# Patient Record
Sex: Female | Born: 1945 | Race: Black or African American | Hispanic: No | Marital: Married | Smoking: Never smoker
Health system: Southern US, Community
[De-identification: ages and names within clinical notes are randomized; demographics above are authoritative.]

## PROBLEM LIST (undated history)

## (undated) DIAGNOSIS — M1990 Unspecified osteoarthritis, unspecified site: Secondary | ICD-10-CM

## (undated) DIAGNOSIS — I10 Essential (primary) hypertension: Secondary | ICD-10-CM

## (undated) DIAGNOSIS — F329 Major depressive disorder, single episode, unspecified: Secondary | ICD-10-CM

## (undated) DIAGNOSIS — N183 Chronic kidney disease, stage 3 unspecified: Secondary | ICD-10-CM

## (undated) DIAGNOSIS — K219 Gastro-esophageal reflux disease without esophagitis: Secondary | ICD-10-CM

## (undated) DIAGNOSIS — I429 Cardiomyopathy, unspecified: Secondary | ICD-10-CM

## (undated) DIAGNOSIS — E785 Hyperlipidemia, unspecified: Secondary | ICD-10-CM

## (undated) DIAGNOSIS — E119 Type 2 diabetes mellitus without complications: Secondary | ICD-10-CM

## (undated) DIAGNOSIS — M109 Gout, unspecified: Secondary | ICD-10-CM

## (undated) DIAGNOSIS — F419 Anxiety disorder, unspecified: Secondary | ICD-10-CM

## (undated) DIAGNOSIS — F32A Depression, unspecified: Secondary | ICD-10-CM

## (undated) DIAGNOSIS — D649 Anemia, unspecified: Secondary | ICD-10-CM

## (undated) HISTORY — DX: Type 2 diabetes mellitus without complications: E11.9

## (undated) HISTORY — DX: Essential (primary) hypertension: I10

## (undated) HISTORY — PX: OTHER SURGICAL HISTORY: SHX169

## (undated) HISTORY — DX: Hyperlipidemia, unspecified: E78.5

## (undated) HISTORY — DX: Anemia, unspecified: D64.9

## (undated) HISTORY — PX: PARTIAL HYSTERECTOMY: SHX80

## (undated) HISTORY — DX: Gout, unspecified: M10.9

## (undated) HISTORY — DX: Gastro-esophageal reflux disease without esophagitis: K21.9

## (undated) HISTORY — PX: BREAST SURGERY: SHX581

## (undated) HISTORY — DX: Chronic kidney disease, stage 3 unspecified: N18.30

## (undated) HISTORY — PX: EYE SURGERY: SHX253

## (undated) HISTORY — DX: Chronic kidney disease, stage 3 (moderate): N18.3

## (undated) HISTORY — PX: CHOLECYSTECTOMY: SHX55

## (undated) HISTORY — DX: Cardiomyopathy, unspecified: I42.9

---
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
## 2000-11-29 ENCOUNTER — Ambulatory Visit (HOSPITAL_COMMUNITY): Admission: RE | Admit: 2000-11-29 | Discharge: 2000-11-29 | Payer: Self-pay | Admitting: Cardiology

## 2004-11-17 ENCOUNTER — Ambulatory Visit: Payer: Self-pay | Admitting: Cardiology

## 2004-12-08 ENCOUNTER — Ambulatory Visit: Payer: Self-pay | Admitting: Cardiology

## 2004-12-09 ENCOUNTER — Ambulatory Visit: Payer: Self-pay | Admitting: Cardiology

## 2004-12-16 ENCOUNTER — Ambulatory Visit: Payer: Self-pay | Admitting: Cardiology

## 2005-04-09 ENCOUNTER — Ambulatory Visit: Payer: Self-pay | Admitting: Cardiology

## 2005-05-15 ENCOUNTER — Inpatient Hospital Stay (HOSPITAL_COMMUNITY): Admission: AD | Admit: 2005-05-15 | Discharge: 2005-05-20 | Payer: Self-pay | Admitting: Neurosurgery

## 2009-02-22 ENCOUNTER — Ambulatory Visit: Payer: Self-pay | Admitting: Cardiology

## 2011-06-15 DIAGNOSIS — H43819 Vitreous degeneration, unspecified eye: Secondary | ICD-10-CM | POA: Insufficient documentation

## 2011-06-15 DIAGNOSIS — H269 Unspecified cataract: Secondary | ICD-10-CM | POA: Insufficient documentation

## 2012-06-01 DIAGNOSIS — E119 Type 2 diabetes mellitus without complications: Secondary | ICD-10-CM | POA: Insufficient documentation

## 2014-11-19 DIAGNOSIS — E780 Pure hypercholesterolemia: Secondary | ICD-10-CM | POA: Diagnosis not present

## 2014-11-19 DIAGNOSIS — E782 Mixed hyperlipidemia: Secondary | ICD-10-CM | POA: Diagnosis not present

## 2014-11-19 DIAGNOSIS — E781 Pure hyperglyceridemia: Secondary | ICD-10-CM | POA: Diagnosis not present

## 2014-11-19 DIAGNOSIS — E875 Hyperkalemia: Secondary | ICD-10-CM | POA: Diagnosis not present

## 2014-11-19 DIAGNOSIS — E1165 Type 2 diabetes mellitus with hyperglycemia: Secondary | ICD-10-CM | POA: Diagnosis not present

## 2014-11-27 DIAGNOSIS — I10 Essential (primary) hypertension: Secondary | ICD-10-CM | POA: Diagnosis not present

## 2014-11-27 DIAGNOSIS — Z1389 Encounter for screening for other disorder: Secondary | ICD-10-CM | POA: Diagnosis not present

## 2014-11-27 DIAGNOSIS — M109 Gout, unspecified: Secondary | ICD-10-CM | POA: Diagnosis not present

## 2014-11-27 DIAGNOSIS — D509 Iron deficiency anemia, unspecified: Secondary | ICD-10-CM | POA: Diagnosis not present

## 2014-11-27 DIAGNOSIS — I5022 Chronic systolic (congestive) heart failure: Secondary | ICD-10-CM | POA: Diagnosis not present

## 2014-11-27 DIAGNOSIS — F411 Generalized anxiety disorder: Secondary | ICD-10-CM | POA: Diagnosis not present

## 2014-11-27 DIAGNOSIS — M170 Bilateral primary osteoarthritis of knee: Secondary | ICD-10-CM | POA: Diagnosis not present

## 2014-12-02 DIAGNOSIS — M109 Gout, unspecified: Secondary | ICD-10-CM | POA: Diagnosis not present

## 2014-12-03 DIAGNOSIS — M25561 Pain in right knee: Secondary | ICD-10-CM | POA: Diagnosis not present

## 2014-12-03 DIAGNOSIS — M79671 Pain in right foot: Secondary | ICD-10-CM | POA: Diagnosis not present

## 2014-12-03 DIAGNOSIS — D649 Anemia, unspecified: Secondary | ICD-10-CM | POA: Diagnosis not present

## 2014-12-03 DIAGNOSIS — Z794 Long term (current) use of insulin: Secondary | ICD-10-CM | POA: Diagnosis not present

## 2014-12-03 DIAGNOSIS — N390 Urinary tract infection, site not specified: Secondary | ICD-10-CM | POA: Diagnosis not present

## 2014-12-03 DIAGNOSIS — I509 Heart failure, unspecified: Secondary | ICD-10-CM | POA: Diagnosis not present

## 2014-12-03 DIAGNOSIS — E780 Pure hypercholesterolemia: Secondary | ICD-10-CM | POA: Diagnosis not present

## 2014-12-03 DIAGNOSIS — R300 Dysuria: Secondary | ICD-10-CM | POA: Diagnosis not present

## 2014-12-03 DIAGNOSIS — E119 Type 2 diabetes mellitus without complications: Secondary | ICD-10-CM | POA: Diagnosis not present

## 2014-12-03 DIAGNOSIS — Z79899 Other long term (current) drug therapy: Secondary | ICD-10-CM | POA: Diagnosis not present

## 2014-12-03 DIAGNOSIS — M109 Gout, unspecified: Secondary | ICD-10-CM | POA: Diagnosis not present

## 2014-12-03 DIAGNOSIS — I10 Essential (primary) hypertension: Secondary | ICD-10-CM | POA: Diagnosis not present

## 2014-12-03 DIAGNOSIS — K219 Gastro-esophageal reflux disease without esophagitis: Secondary | ICD-10-CM | POA: Diagnosis not present

## 2015-01-30 DIAGNOSIS — E119 Type 2 diabetes mellitus without complications: Secondary | ICD-10-CM | POA: Diagnosis not present

## 2015-01-30 DIAGNOSIS — N183 Chronic kidney disease, stage 3 (moderate): Secondary | ICD-10-CM | POA: Diagnosis not present

## 2015-01-30 DIAGNOSIS — M109 Gout, unspecified: Secondary | ICD-10-CM | POA: Diagnosis not present

## 2015-01-30 DIAGNOSIS — M2550 Pain in unspecified joint: Secondary | ICD-10-CM | POA: Diagnosis not present

## 2015-03-06 DIAGNOSIS — E119 Type 2 diabetes mellitus without complications: Secondary | ICD-10-CM | POA: Diagnosis not present

## 2015-03-06 DIAGNOSIS — M2550 Pain in unspecified joint: Secondary | ICD-10-CM | POA: Diagnosis not present

## 2015-03-06 DIAGNOSIS — M109 Gout, unspecified: Secondary | ICD-10-CM | POA: Diagnosis not present

## 2015-03-06 DIAGNOSIS — N183 Chronic kidney disease, stage 3 (moderate): Secondary | ICD-10-CM | POA: Diagnosis not present

## 2015-03-11 DIAGNOSIS — H269 Unspecified cataract: Secondary | ICD-10-CM | POA: Diagnosis not present

## 2015-03-11 DIAGNOSIS — H25813 Combined forms of age-related cataract, bilateral: Secondary | ICD-10-CM | POA: Diagnosis not present

## 2015-03-11 DIAGNOSIS — E119 Type 2 diabetes mellitus without complications: Secondary | ICD-10-CM | POA: Diagnosis not present

## 2015-03-11 DIAGNOSIS — Z794 Long term (current) use of insulin: Secondary | ICD-10-CM | POA: Diagnosis not present

## 2015-03-11 DIAGNOSIS — H43811 Vitreous degeneration, right eye: Secondary | ICD-10-CM | POA: Diagnosis not present

## 2015-03-11 DIAGNOSIS — H40003 Preglaucoma, unspecified, bilateral: Secondary | ICD-10-CM | POA: Diagnosis not present

## 2015-03-21 DIAGNOSIS — E781 Pure hyperglyceridemia: Secondary | ICD-10-CM | POA: Diagnosis not present

## 2015-03-21 DIAGNOSIS — E1122 Type 2 diabetes mellitus with diabetic chronic kidney disease: Secondary | ICD-10-CM | POA: Diagnosis not present

## 2015-03-21 DIAGNOSIS — E780 Pure hypercholesterolemia: Secondary | ICD-10-CM | POA: Diagnosis not present

## 2015-03-21 DIAGNOSIS — E875 Hyperkalemia: Secondary | ICD-10-CM | POA: Diagnosis not present

## 2015-03-21 DIAGNOSIS — I10 Essential (primary) hypertension: Secondary | ICD-10-CM | POA: Diagnosis not present

## 2015-03-28 DIAGNOSIS — F411 Generalized anxiety disorder: Secondary | ICD-10-CM | POA: Diagnosis not present

## 2015-03-28 DIAGNOSIS — I5022 Chronic systolic (congestive) heart failure: Secondary | ICD-10-CM | POA: Diagnosis not present

## 2015-03-28 DIAGNOSIS — M170 Bilateral primary osteoarthritis of knee: Secondary | ICD-10-CM | POA: Diagnosis not present

## 2015-03-28 DIAGNOSIS — D509 Iron deficiency anemia, unspecified: Secondary | ICD-10-CM | POA: Diagnosis not present

## 2015-03-28 DIAGNOSIS — I10 Essential (primary) hypertension: Secondary | ICD-10-CM | POA: Diagnosis not present

## 2015-04-18 DIAGNOSIS — M109 Gout, unspecified: Secondary | ICD-10-CM | POA: Diagnosis not present

## 2015-08-06 DIAGNOSIS — E7800 Pure hypercholesterolemia, unspecified: Secondary | ICD-10-CM | POA: Diagnosis not present

## 2015-08-06 DIAGNOSIS — I10 Essential (primary) hypertension: Secondary | ICD-10-CM | POA: Diagnosis not present

## 2015-08-06 DIAGNOSIS — E1165 Type 2 diabetes mellitus with hyperglycemia: Secondary | ICD-10-CM | POA: Diagnosis not present

## 2015-08-14 DIAGNOSIS — M170 Bilateral primary osteoarthritis of knee: Secondary | ICD-10-CM | POA: Diagnosis not present

## 2015-08-14 DIAGNOSIS — F411 Generalized anxiety disorder: Secondary | ICD-10-CM | POA: Diagnosis not present

## 2015-08-14 DIAGNOSIS — I5022 Chronic systolic (congestive) heart failure: Secondary | ICD-10-CM | POA: Diagnosis not present

## 2015-08-14 DIAGNOSIS — I10 Essential (primary) hypertension: Secondary | ICD-10-CM | POA: Diagnosis not present

## 2015-08-14 DIAGNOSIS — Z23 Encounter for immunization: Secondary | ICD-10-CM | POA: Diagnosis not present

## 2015-08-14 DIAGNOSIS — D509 Iron deficiency anemia, unspecified: Secondary | ICD-10-CM | POA: Diagnosis not present

## 2015-10-14 DIAGNOSIS — E119 Type 2 diabetes mellitus without complications: Secondary | ICD-10-CM | POA: Diagnosis not present

## 2015-10-14 DIAGNOSIS — M659 Synovitis and tenosynovitis, unspecified: Secondary | ICD-10-CM | POA: Diagnosis not present

## 2015-10-14 DIAGNOSIS — M2550 Pain in unspecified joint: Secondary | ICD-10-CM | POA: Diagnosis not present

## 2015-10-14 DIAGNOSIS — M109 Gout, unspecified: Secondary | ICD-10-CM | POA: Diagnosis not present

## 2015-10-14 DIAGNOSIS — N183 Chronic kidney disease, stage 3 (moderate): Secondary | ICD-10-CM | POA: Diagnosis not present

## 2015-10-21 DIAGNOSIS — E1136 Type 2 diabetes mellitus with diabetic cataract: Secondary | ICD-10-CM | POA: Diagnosis not present

## 2015-10-21 DIAGNOSIS — I10 Essential (primary) hypertension: Secondary | ICD-10-CM | POA: Diagnosis not present

## 2015-10-21 DIAGNOSIS — Z7982 Long term (current) use of aspirin: Secondary | ICD-10-CM | POA: Diagnosis not present

## 2015-10-21 DIAGNOSIS — Z880 Allergy status to penicillin: Secondary | ICD-10-CM | POA: Diagnosis not present

## 2015-10-21 DIAGNOSIS — H43811 Vitreous degeneration, right eye: Secondary | ICD-10-CM | POA: Diagnosis not present

## 2015-10-21 DIAGNOSIS — E119 Type 2 diabetes mellitus without complications: Secondary | ICD-10-CM | POA: Diagnosis not present

## 2015-10-21 DIAGNOSIS — H40003 Preglaucoma, unspecified, bilateral: Secondary | ICD-10-CM | POA: Diagnosis not present

## 2015-10-21 DIAGNOSIS — H25813 Combined forms of age-related cataract, bilateral: Secondary | ICD-10-CM | POA: Diagnosis not present

## 2015-10-21 DIAGNOSIS — Z79899 Other long term (current) drug therapy: Secondary | ICD-10-CM | POA: Diagnosis not present

## 2015-10-21 DIAGNOSIS — E1159 Type 2 diabetes mellitus with other circulatory complications: Secondary | ICD-10-CM | POA: Diagnosis not present

## 2015-10-21 DIAGNOSIS — Z794 Long term (current) use of insulin: Secondary | ICD-10-CM | POA: Diagnosis not present

## 2015-11-06 DIAGNOSIS — F419 Anxiety disorder, unspecified: Secondary | ICD-10-CM | POA: Insufficient documentation

## 2015-11-06 DIAGNOSIS — M109 Gout, unspecified: Secondary | ICD-10-CM | POA: Insufficient documentation

## 2015-11-06 DIAGNOSIS — K219 Gastro-esophageal reflux disease without esophagitis: Secondary | ICD-10-CM | POA: Insufficient documentation

## 2015-11-10 DIAGNOSIS — H25813 Combined forms of age-related cataract, bilateral: Secondary | ICD-10-CM | POA: Diagnosis not present

## 2015-11-10 DIAGNOSIS — Z880 Allergy status to penicillin: Secondary | ICD-10-CM | POA: Diagnosis not present

## 2015-11-10 DIAGNOSIS — F419 Anxiety disorder, unspecified: Secondary | ICD-10-CM | POA: Diagnosis not present

## 2015-11-10 DIAGNOSIS — E785 Hyperlipidemia, unspecified: Secondary | ICD-10-CM | POA: Diagnosis not present

## 2015-11-10 DIAGNOSIS — I10 Essential (primary) hypertension: Secondary | ICD-10-CM | POA: Diagnosis not present

## 2015-11-10 DIAGNOSIS — Z7982 Long term (current) use of aspirin: Secondary | ICD-10-CM | POA: Diagnosis not present

## 2015-11-10 DIAGNOSIS — Z794 Long term (current) use of insulin: Secondary | ICD-10-CM | POA: Diagnosis not present

## 2015-11-10 DIAGNOSIS — H25812 Combined forms of age-related cataract, left eye: Secondary | ICD-10-CM | POA: Diagnosis not present

## 2015-11-10 DIAGNOSIS — E119 Type 2 diabetes mellitus without complications: Secondary | ICD-10-CM | POA: Diagnosis not present

## 2015-11-10 DIAGNOSIS — Z7984 Long term (current) use of oral hypoglycemic drugs: Secondary | ICD-10-CM | POA: Diagnosis not present

## 2015-11-10 DIAGNOSIS — M109 Gout, unspecified: Secondary | ICD-10-CM | POA: Diagnosis not present

## 2015-11-10 DIAGNOSIS — K219 Gastro-esophageal reflux disease without esophagitis: Secondary | ICD-10-CM | POA: Diagnosis not present

## 2015-11-12 DIAGNOSIS — N183 Chronic kidney disease, stage 3 (moderate): Secondary | ICD-10-CM | POA: Diagnosis not present

## 2015-11-12 DIAGNOSIS — E119 Type 2 diabetes mellitus without complications: Secondary | ICD-10-CM | POA: Diagnosis not present

## 2015-11-12 DIAGNOSIS — M109 Gout, unspecified: Secondary | ICD-10-CM | POA: Diagnosis not present

## 2015-11-12 DIAGNOSIS — M2550 Pain in unspecified joint: Secondary | ICD-10-CM | POA: Diagnosis not present

## 2015-11-19 DIAGNOSIS — N3000 Acute cystitis without hematuria: Secondary | ICD-10-CM | POA: Diagnosis not present

## 2015-11-27 DIAGNOSIS — Z961 Presence of intraocular lens: Secondary | ICD-10-CM | POA: Diagnosis not present

## 2015-11-27 DIAGNOSIS — Z9842 Cataract extraction status, left eye: Secondary | ICD-10-CM | POA: Diagnosis not present

## 2015-11-27 DIAGNOSIS — Z48810 Encounter for surgical aftercare following surgery on the sense organs: Secondary | ICD-10-CM | POA: Diagnosis not present

## 2015-12-01 DIAGNOSIS — Z794 Long term (current) use of insulin: Secondary | ICD-10-CM | POA: Diagnosis not present

## 2015-12-01 DIAGNOSIS — Z7984 Long term (current) use of oral hypoglycemic drugs: Secondary | ICD-10-CM | POA: Diagnosis not present

## 2015-12-01 DIAGNOSIS — H25811 Combined forms of age-related cataract, right eye: Secondary | ICD-10-CM | POA: Diagnosis not present

## 2015-12-01 DIAGNOSIS — Z7982 Long term (current) use of aspirin: Secondary | ICD-10-CM | POA: Diagnosis not present

## 2015-12-01 DIAGNOSIS — Z880 Allergy status to penicillin: Secondary | ICD-10-CM | POA: Diagnosis not present

## 2015-12-01 DIAGNOSIS — I10 Essential (primary) hypertension: Secondary | ICD-10-CM | POA: Diagnosis not present

## 2015-12-01 DIAGNOSIS — E119 Type 2 diabetes mellitus without complications: Secondary | ICD-10-CM | POA: Diagnosis not present

## 2015-12-01 DIAGNOSIS — F419 Anxiety disorder, unspecified: Secondary | ICD-10-CM | POA: Diagnosis not present

## 2015-12-01 DIAGNOSIS — K219 Gastro-esophageal reflux disease without esophagitis: Secondary | ICD-10-CM | POA: Diagnosis not present

## 2015-12-15 DIAGNOSIS — E782 Mixed hyperlipidemia: Secondary | ICD-10-CM | POA: Diagnosis not present

## 2015-12-15 DIAGNOSIS — E1165 Type 2 diabetes mellitus with hyperglycemia: Secondary | ICD-10-CM | POA: Diagnosis not present

## 2015-12-15 DIAGNOSIS — I10 Essential (primary) hypertension: Secondary | ICD-10-CM | POA: Diagnosis not present

## 2015-12-15 DIAGNOSIS — D649 Anemia, unspecified: Secondary | ICD-10-CM | POA: Diagnosis not present

## 2015-12-15 DIAGNOSIS — E875 Hyperkalemia: Secondary | ICD-10-CM | POA: Diagnosis not present

## 2015-12-25 DIAGNOSIS — I5022 Chronic systolic (congestive) heart failure: Secondary | ICD-10-CM | POA: Diagnosis not present

## 2015-12-25 DIAGNOSIS — D509 Iron deficiency anemia, unspecified: Secondary | ICD-10-CM | POA: Diagnosis not present

## 2015-12-25 DIAGNOSIS — N183 Chronic kidney disease, stage 3 (moderate): Secondary | ICD-10-CM | POA: Diagnosis not present

## 2015-12-25 DIAGNOSIS — I10 Essential (primary) hypertension: Secondary | ICD-10-CM | POA: Diagnosis not present

## 2015-12-26 DIAGNOSIS — Z48810 Encounter for surgical aftercare following surgery on the sense organs: Secondary | ICD-10-CM | POA: Diagnosis not present

## 2015-12-26 DIAGNOSIS — Z9841 Cataract extraction status, right eye: Secondary | ICD-10-CM | POA: Diagnosis not present

## 2015-12-26 DIAGNOSIS — Z961 Presence of intraocular lens: Secondary | ICD-10-CM | POA: Diagnosis not present

## 2015-12-26 DIAGNOSIS — Z9842 Cataract extraction status, left eye: Secondary | ICD-10-CM | POA: Diagnosis not present

## 2016-01-01 DIAGNOSIS — N390 Urinary tract infection, site not specified: Secondary | ICD-10-CM | POA: Diagnosis not present

## 2016-01-01 DIAGNOSIS — R197 Diarrhea, unspecified: Secondary | ICD-10-CM | POA: Diagnosis not present

## 2016-01-01 DIAGNOSIS — R05 Cough: Secondary | ICD-10-CM | POA: Diagnosis not present

## 2016-01-01 DIAGNOSIS — E860 Dehydration: Secondary | ICD-10-CM | POA: Diagnosis not present

## 2016-01-02 DIAGNOSIS — N390 Urinary tract infection, site not specified: Secondary | ICD-10-CM | POA: Diagnosis not present

## 2016-01-02 DIAGNOSIS — J189 Pneumonia, unspecified organism: Secondary | ICD-10-CM | POA: Diagnosis not present

## 2016-01-02 DIAGNOSIS — R197 Diarrhea, unspecified: Secondary | ICD-10-CM | POA: Diagnosis not present

## 2016-01-02 DIAGNOSIS — E860 Dehydration: Secondary | ICD-10-CM | POA: Diagnosis not present

## 2016-01-02 DIAGNOSIS — I5033 Acute on chronic diastolic (congestive) heart failure: Secondary | ICD-10-CM | POA: Diagnosis not present

## 2016-01-02 DIAGNOSIS — E7800 Pure hypercholesterolemia, unspecified: Secondary | ICD-10-CM | POA: Diagnosis not present

## 2016-01-02 DIAGNOSIS — N183 Chronic kidney disease, stage 3 (moderate): Secondary | ICD-10-CM | POA: Diagnosis not present

## 2016-01-02 DIAGNOSIS — I130 Hypertensive heart and chronic kidney disease with heart failure and stage 1 through stage 4 chronic kidney disease, or unspecified chronic kidney disease: Secondary | ICD-10-CM | POA: Diagnosis not present

## 2016-01-02 DIAGNOSIS — R05 Cough: Secondary | ICD-10-CM | POA: Diagnosis not present

## 2016-01-02 DIAGNOSIS — Z794 Long term (current) use of insulin: Secondary | ICD-10-CM | POA: Diagnosis not present

## 2016-01-02 DIAGNOSIS — B9620 Unspecified Escherichia coli [E. coli] as the cause of diseases classified elsewhere: Secondary | ICD-10-CM | POA: Diagnosis not present

## 2016-01-02 DIAGNOSIS — Z880 Allergy status to penicillin: Secondary | ICD-10-CM | POA: Diagnosis not present

## 2016-01-02 DIAGNOSIS — E1122 Type 2 diabetes mellitus with diabetic chronic kidney disease: Secondary | ICD-10-CM | POA: Diagnosis not present

## 2016-01-02 DIAGNOSIS — R0789 Other chest pain: Secondary | ICD-10-CM | POA: Diagnosis not present

## 2016-01-02 DIAGNOSIS — I517 Cardiomegaly: Secondary | ICD-10-CM | POA: Diagnosis not present

## 2016-01-02 DIAGNOSIS — N10 Acute tubulo-interstitial nephritis: Secondary | ICD-10-CM | POA: Diagnosis not present

## 2016-01-02 DIAGNOSIS — M1A9XX0 Chronic gout, unspecified, without tophus (tophi): Secondary | ICD-10-CM | POA: Diagnosis not present

## 2016-01-28 ENCOUNTER — Encounter: Payer: Self-pay | Admitting: *Deleted

## 2016-01-29 DIAGNOSIS — Z761 Encounter for health supervision and care of foundling: Secondary | ICD-10-CM | POA: Diagnosis not present

## 2016-01-29 DIAGNOSIS — H43811 Vitreous degeneration, right eye: Secondary | ICD-10-CM | POA: Diagnosis not present

## 2016-01-29 DIAGNOSIS — Z48810 Encounter for surgical aftercare following surgery on the sense organs: Secondary | ICD-10-CM | POA: Diagnosis not present

## 2016-01-29 DIAGNOSIS — H40003 Preglaucoma, unspecified, bilateral: Secondary | ICD-10-CM | POA: Diagnosis not present

## 2016-01-29 DIAGNOSIS — E119 Type 2 diabetes mellitus without complications: Secondary | ICD-10-CM | POA: Diagnosis not present

## 2016-01-30 ENCOUNTER — Encounter: Payer: Self-pay | Admitting: Cardiology

## 2016-01-30 ENCOUNTER — Ambulatory Visit (INDEPENDENT_AMBULATORY_CARE_PROVIDER_SITE_OTHER): Payer: Medicare Other | Admitting: Cardiology

## 2016-01-30 VITALS — BP 133/70 | HR 75 | Ht 67.0 in | Wt 143.8 lb

## 2016-01-30 DIAGNOSIS — E785 Hyperlipidemia, unspecified: Secondary | ICD-10-CM

## 2016-01-30 DIAGNOSIS — N183 Chronic kidney disease, stage 3 unspecified: Secondary | ICD-10-CM

## 2016-01-30 DIAGNOSIS — I5032 Chronic diastolic (congestive) heart failure: Secondary | ICD-10-CM

## 2016-01-30 DIAGNOSIS — Z136 Encounter for screening for cardiovascular disorders: Secondary | ICD-10-CM | POA: Diagnosis not present

## 2016-01-30 DIAGNOSIS — I10 Essential (primary) hypertension: Secondary | ICD-10-CM

## 2016-01-30 NOTE — Progress Notes (Signed)
Cardiology Office Note  Date: 01/30/2016   ID: Kayla Bell, DOB September 09, 1946, MRN [id number]  PCP: Manon Hilding, MD  Consulting Cardiologist: Rozann Lesches, MD   Chief Complaint  Patient presents with  . Hospitalization Follow-up  . History of cardiomyopathy    History of Present Illness: Kayla Bell is a 70 y.o. female referred for cardiology consultation after recent hospitalization at [facility] in April. She was managed by Dr. Ronne Binning. Records indicate treatment for urinary tract infection. While hospitalized and in the setting of IV fluids, she did develop acute diastolic heart failure. Echocardiogram reported LVEF of 50-55%. She was also noted to have atypical chest wall pain.  She presents today states stating that she feels much better. Reports NYHA class II dyspnea, no chest discomfort or palpitations. She has seen Dr. Quintin Alto in follow-up.  I reviewed her medications. Current cardiac regimen includes Coreg, Lasix, and Cozaar. She states that she will be seeing Dr. Lowanda Foster next week for follow-up on her renal disease.  I reviewed her ECG today which shows sinus rhythm with increased voltage.  Past Medical History  Diagnosis Date  . Chronic kidney disease, stage 3   . Essential hypertension   . Type 2 diabetes mellitus ([place])   . Cardiomyopathy ([place])   . GERD (gastroesophageal reflux disease)   . Anemia   . Hyperlipidemia   . Gout     Past Surgical History  Procedure Laterality Date  . Partial hysterectomy    . Right arm surgery Right   . Breast surgery Left     Current Outpatient Prescriptions  Medication Sig Dispense Refill  . allopurinol (ZYLOPRIM) 300 MG tablet Take 300 mg by mouth daily.    Marland Kitchen aspirin 81 MG tablet Take 81 mg by mouth daily.    . carvedilol (COREG) 25 MG tablet Take 25 mg by mouth 2 (two) times daily with a meal.    . clonazePAM (KLONOPIN) 0.5 MG tablet Take 0.5 mg by mouth every 8 (eight) hours as needed for anxiety.     .  colchicine 0.6 MG tablet Take 0.6 mg by mouth 4 (four) times daily as needed.    . ferrous sulfate 325 (65 FE) MG tablet Take 325 mg by mouth 2 (two) times daily.    . furosemide (LASIX) 20 MG tablet Take 20 mg by mouth daily.    Marland Kitchen gemfibrozil (LOPID) 600 MG tablet Take 600 mg by mouth 2 (two) times daily before a meal.    . insulin glargine (LANTUS) 100 UNIT/ML injection Inject 65 Units into the skin daily.     . lansoprazole (PREVACID) 30 MG capsule Take 30 mg by mouth daily at 12 noon.    Marland Kitchen losartan (COZAAR) 50 MG tablet Take 50 mg by mouth daily.    . pravastatin (PRAVACHOL) 40 MG tablet Take 40 mg by mouth daily.     No current facility-administered medications for this visit.   Allergies:  Penicillins   Social History: The patient  reports that she has never smoked. She does not have any smokeless tobacco history on file.   Family History: The patient's family history is not on file.   ROS:  Please see the history of present illness. Otherwise, complete review of systems is positive for psychosocial stressors with current family matters.  All other systems are reviewed and negative.   Physical Exam: VS:  BP 133/70 mmHg  Pulse 75  Ht 5\' 7"  (1.702 m)  Wt 143 lb 12.8 oz (  65.227 kg)  BMI 22.52 kg/m2  SpO2 100%, BMI Body mass index is 22.52 kg/(m^2).  Wt Readings from Last 3 Encounters:  01/30/16 143 lb 12.8 oz (65.227 kg)    General: Patient appears comfortable at rest. HEENT: Conjunctiva and lids normal, oropharynx clear. Neck: Supple, no elevated JVP or carotid bruits, no thyromegaly. Lungs: Clear to auscultation, nonlabored breathing at rest. Cardiac: Regular rate and rhythm, S4, no significant systolic murmur, no pericardial rub. Abdomen: Soft, nontender, bowel sounds present, no guarding or rebound. Extremities: No pitting edema, distal pulses 2+. Skin: Warm and dry. Musculoskeletal: No kyphosis. Neuropsychiatric: Alert and oriented x3, affect grossly  appropriate.  ECG: No tracing available.  Recent Labwork:  April 2017: Hemoglobin 7.6-11.0, creatinine 1.4-2.0, NT pro-BNP 5801  Other Studies Reviewed Today:  Chest x-ray 01/05/2016 [facility]): Left mid zone small calcified granuloma, diffuse right lung pneumonia, mild stable cardiomegaly.  Echocardiogram 01/06/2016 [facility]): Mild to moderate LVH with LVEF 50-55%. Hypokinesis of the basal inferior, inferoseptal, and anteroseptal wall, abnormal diastolic function, mild to moderate left atrial enlargement, no substantial valvular abnormalities, RVSP 38 mmHg, no pericardial effusion.  Assessment and Plan:  1. Episode of acute diastolic heart failure in the setting of IV hydration during hospital stay in April. I reviewed her echocardiogram report. At this point would continue current regimen including Coreg, Cozaar, and Lasix. Compared to prior assessment, her LV systolic function had actually improved.  2. Essential hypertension, blood pressure control is adequate today.  3. CKD stage III, pending follow-up with Dr. Lowanda Foster.  4. Hyperlipidemia, on Pravachol.  Current medicines were reviewed with the patient today.   Orders Placed This Encounter  Procedures  . EKG 12-Lead    Disposition: FU with me in 6 months.   Signed, Satira Sark, MD, [facility] 01/30/2016 1:46 PM    [place] at [place], [place], [place] [address] Phone: [phone]; Fax: [phone]

## 2016-01-30 NOTE — Patient Instructions (Signed)
Your physician recommends that you continue on your current medications as directed. Please refer to the Current Medication list given to you today. Your physician recommends that you schedule a follow-up appointment in: 6 months. You will receive a reminder letter in the mail in about 4 months reminding you to call and schedule your appointment. If you don't receive this letter, please contact our office. 

## 2016-02-06 DIAGNOSIS — R300 Dysuria: Secondary | ICD-10-CM | POA: Diagnosis not present

## 2016-02-07 DIAGNOSIS — E782 Mixed hyperlipidemia: Secondary | ICD-10-CM | POA: Diagnosis not present

## 2016-02-07 DIAGNOSIS — E1165 Type 2 diabetes mellitus with hyperglycemia: Secondary | ICD-10-CM | POA: Diagnosis not present

## 2016-02-07 DIAGNOSIS — E1122 Type 2 diabetes mellitus with diabetic chronic kidney disease: Secondary | ICD-10-CM | POA: Diagnosis not present

## 2016-02-07 DIAGNOSIS — N183 Chronic kidney disease, stage 3 (moderate): Secondary | ICD-10-CM | POA: Diagnosis not present

## 2016-02-07 DIAGNOSIS — I5022 Chronic systolic (congestive) heart failure: Secondary | ICD-10-CM | POA: Diagnosis not present

## 2016-02-12 DIAGNOSIS — M2550 Pain in unspecified joint: Secondary | ICD-10-CM | POA: Diagnosis not present

## 2016-02-12 DIAGNOSIS — E119 Type 2 diabetes mellitus without complications: Secondary | ICD-10-CM | POA: Diagnosis not present

## 2016-02-12 DIAGNOSIS — N183 Chronic kidney disease, stage 3 (moderate): Secondary | ICD-10-CM | POA: Diagnosis not present

## 2016-02-12 DIAGNOSIS — M109 Gout, unspecified: Secondary | ICD-10-CM | POA: Diagnosis not present

## 2016-02-26 DIAGNOSIS — M109 Gout, unspecified: Secondary | ICD-10-CM | POA: Diagnosis not present

## 2016-02-26 DIAGNOSIS — N183 Chronic kidney disease, stage 3 (moderate): Secondary | ICD-10-CM | POA: Diagnosis not present

## 2016-02-26 DIAGNOSIS — E119 Type 2 diabetes mellitus without complications: Secondary | ICD-10-CM | POA: Diagnosis not present

## 2016-02-26 DIAGNOSIS — M2550 Pain in unspecified joint: Secondary | ICD-10-CM | POA: Diagnosis not present

## 2016-03-02 DIAGNOSIS — D509 Iron deficiency anemia, unspecified: Secondary | ICD-10-CM | POA: Diagnosis not present

## 2016-03-02 DIAGNOSIS — I10 Essential (primary) hypertension: Secondary | ICD-10-CM | POA: Diagnosis not present

## 2016-03-02 DIAGNOSIS — N179 Acute kidney failure, unspecified: Secondary | ICD-10-CM | POA: Diagnosis not present

## 2016-03-02 DIAGNOSIS — E1129 Type 2 diabetes mellitus with other diabetic kidney complication: Secondary | ICD-10-CM | POA: Diagnosis not present

## 2016-03-30 DIAGNOSIS — E559 Vitamin D deficiency, unspecified: Secondary | ICD-10-CM | POA: Diagnosis not present

## 2016-03-30 DIAGNOSIS — D509 Iron deficiency anemia, unspecified: Secondary | ICD-10-CM | POA: Diagnosis not present

## 2016-03-30 DIAGNOSIS — N189 Chronic kidney disease, unspecified: Secondary | ICD-10-CM | POA: Diagnosis not present

## 2016-03-30 DIAGNOSIS — R809 Proteinuria, unspecified: Secondary | ICD-10-CM | POA: Diagnosis not present

## 2016-03-30 DIAGNOSIS — Z79899 Other long term (current) drug therapy: Secondary | ICD-10-CM | POA: Diagnosis not present

## 2016-03-30 DIAGNOSIS — I129 Hypertensive chronic kidney disease with stage 1 through stage 4 chronic kidney disease, or unspecified chronic kidney disease: Secondary | ICD-10-CM | POA: Diagnosis not present

## 2016-03-30 DIAGNOSIS — N289 Disorder of kidney and ureter, unspecified: Secondary | ICD-10-CM | POA: Diagnosis not present

## 2016-03-30 DIAGNOSIS — N183 Chronic kidney disease, stage 3 (moderate): Secondary | ICD-10-CM | POA: Diagnosis not present

## 2016-04-08 DIAGNOSIS — R300 Dysuria: Secondary | ICD-10-CM | POA: Diagnosis not present

## 2016-04-13 DIAGNOSIS — N179 Acute kidney failure, unspecified: Secondary | ICD-10-CM | POA: Diagnosis not present

## 2016-04-13 DIAGNOSIS — I10 Essential (primary) hypertension: Secondary | ICD-10-CM | POA: Diagnosis not present

## 2016-04-13 DIAGNOSIS — I509 Heart failure, unspecified: Secondary | ICD-10-CM | POA: Diagnosis not present

## 2016-04-13 DIAGNOSIS — R809 Proteinuria, unspecified: Secondary | ICD-10-CM | POA: Diagnosis not present

## 2016-04-13 DIAGNOSIS — E1129 Type 2 diabetes mellitus with other diabetic kidney complication: Secondary | ICD-10-CM | POA: Diagnosis not present

## 2016-04-15 DIAGNOSIS — E1122 Type 2 diabetes mellitus with diabetic chronic kidney disease: Secondary | ICD-10-CM | POA: Diagnosis not present

## 2016-04-15 DIAGNOSIS — E875 Hyperkalemia: Secondary | ICD-10-CM | POA: Diagnosis not present

## 2016-04-15 DIAGNOSIS — E7800 Pure hypercholesterolemia, unspecified: Secondary | ICD-10-CM | POA: Diagnosis not present

## 2016-04-15 DIAGNOSIS — D509 Iron deficiency anemia, unspecified: Secondary | ICD-10-CM | POA: Diagnosis not present

## 2016-04-15 DIAGNOSIS — E781 Pure hyperglyceridemia: Secondary | ICD-10-CM | POA: Diagnosis not present

## 2016-04-15 DIAGNOSIS — E782 Mixed hyperlipidemia: Secondary | ICD-10-CM | POA: Diagnosis not present

## 2016-04-19 DIAGNOSIS — N183 Chronic kidney disease, stage 3 (moderate): Secondary | ICD-10-CM | POA: Diagnosis not present

## 2016-04-19 DIAGNOSIS — I5022 Chronic systolic (congestive) heart failure: Secondary | ICD-10-CM | POA: Diagnosis not present

## 2016-04-19 DIAGNOSIS — I10 Essential (primary) hypertension: Secondary | ICD-10-CM | POA: Diagnosis not present

## 2016-04-19 DIAGNOSIS — D509 Iron deficiency anemia, unspecified: Secondary | ICD-10-CM | POA: Diagnosis not present

## 2016-07-13 DIAGNOSIS — N183 Chronic kidney disease, stage 3 (moderate): Secondary | ICD-10-CM | POA: Diagnosis not present

## 2016-07-13 DIAGNOSIS — M2550 Pain in unspecified joint: Secondary | ICD-10-CM | POA: Diagnosis not present

## 2016-07-13 DIAGNOSIS — E119 Type 2 diabetes mellitus without complications: Secondary | ICD-10-CM | POA: Diagnosis not present

## 2016-07-13 DIAGNOSIS — M109 Gout, unspecified: Secondary | ICD-10-CM | POA: Diagnosis not present

## 2016-07-27 DIAGNOSIS — I129 Hypertensive chronic kidney disease with stage 1 through stage 4 chronic kidney disease, or unspecified chronic kidney disease: Secondary | ICD-10-CM | POA: Diagnosis not present

## 2016-07-27 DIAGNOSIS — D509 Iron deficiency anemia, unspecified: Secondary | ICD-10-CM | POA: Diagnosis not present

## 2016-07-27 DIAGNOSIS — E559 Vitamin D deficiency, unspecified: Secondary | ICD-10-CM | POA: Diagnosis not present

## 2016-07-27 DIAGNOSIS — Z79899 Other long term (current) drug therapy: Secondary | ICD-10-CM | POA: Diagnosis not present

## 2016-07-27 DIAGNOSIS — N183 Chronic kidney disease, stage 3 (moderate): Secondary | ICD-10-CM | POA: Diagnosis not present

## 2016-07-27 DIAGNOSIS — R809 Proteinuria, unspecified: Secondary | ICD-10-CM | POA: Diagnosis not present

## 2016-08-03 DIAGNOSIS — N183 Chronic kidney disease, stage 3 (moderate): Secondary | ICD-10-CM | POA: Diagnosis not present

## 2016-08-03 DIAGNOSIS — I10 Essential (primary) hypertension: Secondary | ICD-10-CM | POA: Diagnosis not present

## 2016-08-03 DIAGNOSIS — E1129 Type 2 diabetes mellitus with other diabetic kidney complication: Secondary | ICD-10-CM | POA: Diagnosis not present

## 2016-08-03 DIAGNOSIS — N390 Urinary tract infection, site not specified: Secondary | ICD-10-CM | POA: Diagnosis not present

## 2016-08-03 DIAGNOSIS — D649 Anemia, unspecified: Secondary | ICD-10-CM | POA: Diagnosis not present

## 2016-08-18 DIAGNOSIS — E781 Pure hyperglyceridemia: Secondary | ICD-10-CM | POA: Diagnosis not present

## 2016-08-18 DIAGNOSIS — E782 Mixed hyperlipidemia: Secondary | ICD-10-CM | POA: Diagnosis not present

## 2016-08-18 DIAGNOSIS — E7800 Pure hypercholesterolemia, unspecified: Secondary | ICD-10-CM | POA: Diagnosis not present

## 2016-08-18 DIAGNOSIS — E875 Hyperkalemia: Secondary | ICD-10-CM | POA: Diagnosis not present

## 2016-08-18 DIAGNOSIS — E1122 Type 2 diabetes mellitus with diabetic chronic kidney disease: Secondary | ICD-10-CM | POA: Diagnosis not present

## 2016-08-20 DIAGNOSIS — D509 Iron deficiency anemia, unspecified: Secondary | ICD-10-CM | POA: Diagnosis not present

## 2016-08-20 DIAGNOSIS — Z23 Encounter for immunization: Secondary | ICD-10-CM | POA: Diagnosis not present

## 2016-08-20 DIAGNOSIS — N183 Chronic kidney disease, stage 3 (moderate): Secondary | ICD-10-CM | POA: Diagnosis not present

## 2016-08-20 DIAGNOSIS — I10 Essential (primary) hypertension: Secondary | ICD-10-CM | POA: Diagnosis not present

## 2016-08-20 DIAGNOSIS — I5022 Chronic systolic (congestive) heart failure: Secondary | ICD-10-CM | POA: Diagnosis not present

## 2016-11-11 DIAGNOSIS — M109 Gout, unspecified: Secondary | ICD-10-CM | POA: Diagnosis not present

## 2016-11-11 DIAGNOSIS — M1990 Unspecified osteoarthritis, unspecified site: Secondary | ICD-10-CM | POA: Diagnosis not present

## 2016-11-11 DIAGNOSIS — N183 Chronic kidney disease, stage 3 (moderate): Secondary | ICD-10-CM | POA: Diagnosis not present

## 2016-11-11 DIAGNOSIS — E119 Type 2 diabetes mellitus without complications: Secondary | ICD-10-CM | POA: Diagnosis not present

## 2016-12-22 DIAGNOSIS — E1165 Type 2 diabetes mellitus with hyperglycemia: Secondary | ICD-10-CM | POA: Diagnosis not present

## 2016-12-22 DIAGNOSIS — E1122 Type 2 diabetes mellitus with diabetic chronic kidney disease: Secondary | ICD-10-CM | POA: Diagnosis not present

## 2016-12-22 DIAGNOSIS — E7800 Pure hypercholesterolemia, unspecified: Secondary | ICD-10-CM | POA: Diagnosis not present

## 2016-12-22 DIAGNOSIS — R300 Dysuria: Secondary | ICD-10-CM | POA: Diagnosis not present

## 2016-12-22 DIAGNOSIS — E781 Pure hyperglyceridemia: Secondary | ICD-10-CM | POA: Diagnosis not present

## 2016-12-22 DIAGNOSIS — E782 Mixed hyperlipidemia: Secondary | ICD-10-CM | POA: Diagnosis not present

## 2016-12-23 ENCOUNTER — Ambulatory Visit: Payer: Medicare Other | Admitting: Cardiology

## 2016-12-24 DIAGNOSIS — I10 Essential (primary) hypertension: Secondary | ICD-10-CM | POA: Diagnosis not present

## 2016-12-24 DIAGNOSIS — R809 Proteinuria, unspecified: Secondary | ICD-10-CM | POA: Diagnosis not present

## 2016-12-24 DIAGNOSIS — I129 Hypertensive chronic kidney disease with stage 1 through stage 4 chronic kidney disease, or unspecified chronic kidney disease: Secondary | ICD-10-CM | POA: Diagnosis not present

## 2016-12-24 DIAGNOSIS — N183 Chronic kidney disease, stage 3 (moderate): Secondary | ICD-10-CM | POA: Diagnosis not present

## 2016-12-24 DIAGNOSIS — E559 Vitamin D deficiency, unspecified: Secondary | ICD-10-CM | POA: Diagnosis not present

## 2016-12-24 DIAGNOSIS — Z23 Encounter for immunization: Secondary | ICD-10-CM | POA: Diagnosis not present

## 2016-12-24 DIAGNOSIS — Z79899 Other long term (current) drug therapy: Secondary | ICD-10-CM | POA: Diagnosis not present

## 2016-12-24 DIAGNOSIS — I5022 Chronic systolic (congestive) heart failure: Secondary | ICD-10-CM | POA: Diagnosis not present

## 2016-12-24 DIAGNOSIS — D509 Iron deficiency anemia, unspecified: Secondary | ICD-10-CM | POA: Diagnosis not present

## 2016-12-24 DIAGNOSIS — E782 Mixed hyperlipidemia: Secondary | ICD-10-CM | POA: Diagnosis not present

## 2016-12-28 DIAGNOSIS — E1129 Type 2 diabetes mellitus with other diabetic kidney complication: Secondary | ICD-10-CM | POA: Diagnosis not present

## 2016-12-28 DIAGNOSIS — I10 Essential (primary) hypertension: Secondary | ICD-10-CM | POA: Diagnosis not present

## 2016-12-28 DIAGNOSIS — D649 Anemia, unspecified: Secondary | ICD-10-CM | POA: Diagnosis not present

## 2016-12-28 DIAGNOSIS — N183 Chronic kidney disease, stage 3 (moderate): Secondary | ICD-10-CM | POA: Diagnosis not present

## 2016-12-28 DIAGNOSIS — R809 Proteinuria, unspecified: Secondary | ICD-10-CM | POA: Diagnosis not present

## 2017-01-28 NOTE — Progress Notes (Deleted)
Cardiology Office Note  Date: 01/28/2017   ID: Kayla Bell, DOB [birth year], MRN [id number]  PCP: Manon Hilding, MD  Primary Cardiologist: Rozann Lesches, MD   No chief complaint on file.   History of Present Illness: Kayla Bell is a 71 y.o. female seen in consultation back in May 2017.  Past Medical History:  Diagnosis Date  . Anemia   . Cardiomyopathy ([place])   . Chronic kidney disease, stage 3   . Essential hypertension   . GERD (gastroesophageal reflux disease)   . Gout   . Hyperlipidemia   . Type 2 diabetes mellitus ([place])     Past Surgical History:  Procedure Laterality Date  . BREAST SURGERY Left   . PARTIAL HYSTERECTOMY    . RIGHT ARM SURGERY Right     Current Outpatient Prescriptions  Medication Sig Dispense Refill  . allopurinol (ZYLOPRIM) 300 MG tablet Take 300 mg by mouth daily.    Marland Kitchen aspirin 81 MG tablet Take 81 mg by mouth daily.    . carvedilol (COREG) 25 MG tablet Take 25 mg by mouth 2 (two) times daily with a meal.    . clonazePAM (KLONOPIN) 0.5 MG tablet Take 0.5 mg by mouth every 8 (eight) hours as needed for anxiety.     . colchicine 0.6 MG tablet Take 0.6 mg by mouth 4 (four) times daily as needed.    . ferrous sulfate 325 (65 FE) MG tablet Take 325 mg by mouth 2 (two) times daily.    . furosemide (LASIX) 20 MG tablet Take 20 mg by mouth daily.    Marland Kitchen gemfibrozil (LOPID) 600 MG tablet Take 600 mg by mouth 2 (two) times daily before a meal.    . insulin glargine (LANTUS) 100 UNIT/ML injection Inject 65 Units into the skin daily.     . lansoprazole (PREVACID) 30 MG capsule Take 30 mg by mouth daily at 12 noon.    Marland Kitchen losartan (COZAAR) 50 MG tablet Take 50 mg by mouth daily.    . pravastatin (PRAVACHOL) 40 MG tablet Take 40 mg by mouth daily.     No current facility-administered medications for this visit.    Allergies:  Penicillins   Social History: The patient  reports that she has never smoked. She does not have any smokeless  tobacco history on file.   Family History: The patient's family history is not on file.   ROS:  Please see the history of present illness. Otherwise, complete review of systems is positive for {NONE DEFAULTED:18576::"none"}.  All other systems are reviewed and negative.   Physical Exam: VS:  There were no vitals taken for this visit., BMI There is no height or weight on file to calculate BMI.  Wt Readings from Last 3 Encounters:  01/30/16 143 lb 12.8 oz (65.2 kg)    General: Patient appears comfortable at rest. HEENT: Conjunctiva and lids normal, oropharynx clear. Neck: Supple, no elevated JVP or carotid bruits, no thyromegaly. Lungs: Clear to auscultation, nonlabored breathing at rest. Cardiac: Regular rate and rhythm, S4, no significant systolic murmur, no pericardial rub. Abdomen: Soft, nontender, bowel sounds present, no guarding or rebound. Extremities: No pitting edema, distal pulses 2+. Skin: Warm and dry. Musculoskeletal: No kyphosis. Neuropsychiatric: Alert and oriented x3, affect grossly appropriate.  ECG: I personally reviewed the tracing from 01/30/2016 which showed sinus rhythm with increased voltage.  Recent Labwork:  April 2017: Hemoglobin 7.6-11.0, creatinine 1.4-2.0, NT pro-BNP 5801  Other Studies Reviewed Today:  Echocardiogram  01/06/2016 [facility]): Mild to moderate LVH with LVEF 50-55%. Hypokinesis of the basal inferior, inferoseptal, and anteroseptal wall, abnormal diastolic function, mild to moderate left atrial enlargement, no substantial valvular abnormalities, RVSP 38 mmHg, no pericardial effusion.  Assessment and Plan:    Current medicines were reviewed with the patient today.  No orders of the defined types were placed in this encounter.   Disposition:  Signed, Satira Sark, MD, [facility] 01/28/2017 2:47 PM    [place] [facility] [place], [place], [place] [address] Phone: [phone]; Fax: [phone]

## 2017-01-31 ENCOUNTER — Ambulatory Visit: Payer: Medicare Other | Admitting: Cardiology

## 2017-02-23 NOTE — Progress Notes (Signed)
Cardiology Office Note  Date: 02/24/2017   ID: Kayla Bell, DOB 1946/05/06, MRN [id number]  PCP: Manon Hilding, MD  Primary Cardiologist: Rozann Lesches, MD   Chief Complaint  Patient presents with  . History of cardiomyopathy    History of Present Illness: Kayla Bell is a 71 y.o. female last seen in May 2017. She presents for a routine follow-up visit. From a cardiac perspective, she does not report any worsening shortness of breath or chest pain. No orthopnea or PND, no leg swelling.  I reviewed her medications which are outlined below. Cardiac regimen includes Coreg, Cozaar, low-dose Lasix, and Pravachol. She has CKD stage 3, has followed with nephrology and states that her renal function has actually improved somewhat.  I personally reviewed her ECG today which shows sinus rhythm with LVH and repolarization changes.  Past Medical History:  Diagnosis Date  . Anemia   . Cardiomyopathy ([place])   . Chronic kidney disease, stage 3   . Essential hypertension   . GERD (gastroesophageal reflux disease)   . Gout   . Hyperlipidemia   . Type 2 diabetes mellitus ([place])     Past Surgical History:  Procedure Laterality Date  . BREAST SURGERY Left   . PARTIAL HYSTERECTOMY    . RIGHT ARM SURGERY Right     Current Outpatient Prescriptions  Medication Sig Dispense Refill  . allopurinol (ZYLOPRIM) 300 MG tablet Take 150 mg by mouth daily.     Marland Kitchen aspirin 81 MG tablet Take 81 mg by mouth daily.    . carvedilol (COREG) 25 MG tablet Take 25 mg by mouth 2 (two) times daily with a meal.    . clonazePAM (KLONOPIN) 0.5 MG tablet Take 0.5 mg by mouth every 8 (eight) hours as needed for anxiety.     . colchicine 0.6 MG tablet Take 0.6 mg by mouth 4 (four) times daily as needed.    . ferrous sulfate 325 (65 FE) MG tablet Take 325 mg by mouth daily.     . furosemide (LASIX) 20 MG tablet Take 20 mg by mouth daily.    Marland Kitchen gemfibrozil (LOPID) 600 MG tablet Take 600 mg by mouth 2  (two) times daily before a meal.    . insulin glargine (LANTUS) 100 UNIT/ML injection Inject 55 Units into the skin daily.     . lansoprazole (PREVACID) 30 MG capsule Take 30 mg by mouth daily at 12 noon.    Marland Kitchen losartan (COZAAR) 50 MG tablet Take 50 mg by mouth daily.    . pravastatin (PRAVACHOL) 40 MG tablet Take 40 mg by mouth daily.     No current facility-administered medications for this visit.    Allergies:  Penicillins   Social History: The patient  reports that she has never smoked. She has never used smokeless tobacco.   ROS:  Please see the history of present illness. Otherwise, complete review of systems is positive for recent constipation.  All other systems are reviewed and negative.   Physical Exam: VS:  BP 139/77   Pulse 77   Ht 5\' 7"  (1.702 m)   Wt 163 lb 3.2 oz (74 kg)   SpO2 98%   BMI 25.56 kg/m , BMI Body mass index is 25.56 kg/m.  Wt Readings from Last 3 Encounters:  02/24/17 163 lb 3.2 oz (74 kg)  01/30/16 143 lb 12.8 oz (65.2 kg)    General: Patient appears comfortable at rest. HEENT: Conjunctiva and lids normal, oropharynx clear. Neck:  Supple, no elevated JVP or carotid bruits, no thyromegaly. Lungs: Clear to auscultation, nonlabored breathing at rest. Cardiac: Regular rate and rhythm, S4, no significant systolic murmur, no pericardial rub. Abdomen: Soft, nontender, bowel sounds present, no guarding or rebound. Extremities: No pitting edema, distal pulses 2+. Skin: Warm and dry. Musculoskeletal: No kyphosis. Neuropsychiatric: Alert and oriented x3, affect grossly appropriate.  ECG: I personally reviewed the tracing from 01/30/2016 which showed sinus rhythm with LVH.  Recent Labwork:  April 2017: Hemoglobin 7.6-11.0, creatinine 1.4-2.0, NT pro-BNP 5801  Other Studies Reviewed Today:  Echocardiogram 01/06/2016 [facility]): Mild to moderate LVH with LVEF 50-55%. Hypokinesis of the basal inferior, inferoseptal, and anteroseptal wall, abnormal diastolic  function, mild to moderate left atrial enlargement, no substantial valvular abnormalities, RVSP 38 mmHg, no pericardial effusion.  Assessment and Plan:  1. History of cardiomyopathy, LVEF 50-55% by echocardiogram last year at [facility]. Plan to continue current medical regimen, follow-up echocardiogram for reassessment. Her weight is up compared to last year, but she does not appear to be fluid overloaded. No change in diuretic dose.  2. CKD stage 3, follows with nephrology.  3. Essential hypertension, on Cozaar and Coreg, systolic blood pressure in the 130s today. No changes were made.  4. Hyperlipidemia, on Pravachol. Continues to follow with Dr. Quintin Alto.  Current medicines were reviewed with the patient today.   Orders Placed This Encounter  Procedures  . EKG 12-Lead  . ECHOCARDIOGRAM COMPLETE    Disposition: Follow-up in one year.  Signed, Satira Sark, MD, [facility] 02/24/2017 3:45 PM    [place] [facility] [place], [place], [place] [address] Phone: [phone]; Fax: [phone]

## 2017-02-24 ENCOUNTER — Telehealth: Payer: Self-pay | Admitting: Cardiology

## 2017-02-24 ENCOUNTER — Ambulatory Visit (INDEPENDENT_AMBULATORY_CARE_PROVIDER_SITE_OTHER): Payer: Medicare Other | Admitting: Cardiology

## 2017-02-24 ENCOUNTER — Encounter: Payer: Self-pay | Admitting: Cardiology

## 2017-02-24 VITALS — BP 139/77 | HR 77 | Ht 67.0 in | Wt 163.2 lb

## 2017-02-24 DIAGNOSIS — I10 Essential (primary) hypertension: Secondary | ICD-10-CM | POA: Diagnosis not present

## 2017-02-24 DIAGNOSIS — I5032 Chronic diastolic (congestive) heart failure: Secondary | ICD-10-CM | POA: Insufficient documentation

## 2017-02-24 DIAGNOSIS — Z8679 Personal history of other diseases of the circulatory system: Secondary | ICD-10-CM

## 2017-02-24 DIAGNOSIS — N183 Chronic kidney disease, stage 3 unspecified: Secondary | ICD-10-CM

## 2017-02-24 DIAGNOSIS — I429 Cardiomyopathy, unspecified: Secondary | ICD-10-CM | POA: Insufficient documentation

## 2017-02-24 DIAGNOSIS — E782 Mixed hyperlipidemia: Secondary | ICD-10-CM | POA: Diagnosis not present

## 2017-02-24 NOTE — Telephone Encounter (Signed)
ECHO scheduled in [place] March 03, 2017

## 2017-02-24 NOTE — Patient Instructions (Signed)
Medication Instructions:  Your physician recommends that you continue on your current medications as directed. Please refer to the Current Medication list given to you today.  Labwork: NONE  Testing/Procedures: Your physician has requested that you have an echocardiogram. Echocardiography is a painless test that uses sound waves to create images of your heart. It provides your doctor with information about the size and shape of your heart and how well your heart's chambers and valves are working. This procedure takes approximately one hour. There are no restrictions for this procedure.  Follow-Up: Your physician wants you to follow-up in: 1 YEAR WITH DR. MCDOWELL. You will receive a reminder letter in the mail two months in advance. If you don't receive a letter, please call our office to schedule the follow-up appointment.  Any Other Special Instructions Will Be Listed Below (If Applicable).  If you need a refill on your cardiac medications before your next appointment, please call your pharmacy. 

## 2017-03-03 ENCOUNTER — Ambulatory Visit (INDEPENDENT_AMBULATORY_CARE_PROVIDER_SITE_OTHER): Payer: Medicare Other

## 2017-03-03 DIAGNOSIS — Z8679 Personal history of other diseases of the circulatory system: Secondary | ICD-10-CM | POA: Diagnosis not present

## 2017-03-04 ENCOUNTER — Telehealth: Payer: Self-pay

## 2017-03-04 NOTE — Telephone Encounter (Signed)
-----   Message from Rogelia Mire, NP sent at 03/03/2017  6:56 PM EDT ----- LV function remains stable and low normal @ 50-55%.  The mitral valve is moderately leaky, which is something that we will follow-up on in the future.

## 2017-03-04 NOTE — Telephone Encounter (Signed)
Patient notified. Routed to PCP 

## 2017-04-19 DIAGNOSIS — E781 Pure hyperglyceridemia: Secondary | ICD-10-CM | POA: Diagnosis not present

## 2017-04-19 DIAGNOSIS — E1165 Type 2 diabetes mellitus with hyperglycemia: Secondary | ICD-10-CM | POA: Diagnosis not present

## 2017-04-19 DIAGNOSIS — E1122 Type 2 diabetes mellitus with diabetic chronic kidney disease: Secondary | ICD-10-CM | POA: Diagnosis not present

## 2017-04-19 DIAGNOSIS — E7800 Pure hypercholesterolemia, unspecified: Secondary | ICD-10-CM | POA: Diagnosis not present

## 2017-04-19 DIAGNOSIS — E782 Mixed hyperlipidemia: Secondary | ICD-10-CM | POA: Diagnosis not present

## 2017-04-21 DIAGNOSIS — I5022 Chronic systolic (congestive) heart failure: Secondary | ICD-10-CM | POA: Diagnosis not present

## 2017-04-21 DIAGNOSIS — E782 Mixed hyperlipidemia: Secondary | ICD-10-CM | POA: Diagnosis not present

## 2017-04-21 DIAGNOSIS — I10 Essential (primary) hypertension: Secondary | ICD-10-CM | POA: Diagnosis not present

## 2017-04-21 DIAGNOSIS — N183 Chronic kidney disease, stage 3 (moderate): Secondary | ICD-10-CM | POA: Diagnosis not present

## 2017-04-21 DIAGNOSIS — D509 Iron deficiency anemia, unspecified: Secondary | ICD-10-CM | POA: Diagnosis not present

## 2017-04-29 DIAGNOSIS — E559 Vitamin D deficiency, unspecified: Secondary | ICD-10-CM | POA: Diagnosis not present

## 2017-04-29 DIAGNOSIS — Z79899 Other long term (current) drug therapy: Secondary | ICD-10-CM | POA: Diagnosis not present

## 2017-04-29 DIAGNOSIS — I129 Hypertensive chronic kidney disease with stage 1 through stage 4 chronic kidney disease, or unspecified chronic kidney disease: Secondary | ICD-10-CM | POA: Diagnosis not present

## 2017-04-29 DIAGNOSIS — D509 Iron deficiency anemia, unspecified: Secondary | ICD-10-CM | POA: Diagnosis not present

## 2017-04-29 DIAGNOSIS — N183 Chronic kidney disease, stage 3 (moderate): Secondary | ICD-10-CM | POA: Diagnosis not present

## 2017-04-29 DIAGNOSIS — R809 Proteinuria, unspecified: Secondary | ICD-10-CM | POA: Diagnosis not present

## 2017-05-03 DIAGNOSIS — R809 Proteinuria, unspecified: Secondary | ICD-10-CM | POA: Diagnosis not present

## 2017-05-03 DIAGNOSIS — R300 Dysuria: Secondary | ICD-10-CM | POA: Diagnosis not present

## 2017-05-03 DIAGNOSIS — N183 Chronic kidney disease, stage 3 (moderate): Secondary | ICD-10-CM | POA: Diagnosis not present

## 2017-05-03 DIAGNOSIS — M109 Gout, unspecified: Secondary | ICD-10-CM | POA: Diagnosis not present

## 2017-05-03 DIAGNOSIS — N3001 Acute cystitis with hematuria: Secondary | ICD-10-CM | POA: Diagnosis not present

## 2017-05-03 DIAGNOSIS — E1129 Type 2 diabetes mellitus with other diabetic kidney complication: Secondary | ICD-10-CM | POA: Diagnosis not present

## 2017-06-09 DIAGNOSIS — Z79899 Other long term (current) drug therapy: Secondary | ICD-10-CM | POA: Diagnosis not present

## 2017-06-09 DIAGNOSIS — K219 Gastro-esophageal reflux disease without esophagitis: Secondary | ICD-10-CM | POA: Diagnosis not present

## 2017-06-09 DIAGNOSIS — I509 Heart failure, unspecified: Secondary | ICD-10-CM | POA: Diagnosis not present

## 2017-06-09 DIAGNOSIS — Z87891 Personal history of nicotine dependence: Secondary | ICD-10-CM | POA: Diagnosis not present

## 2017-06-09 DIAGNOSIS — E7800 Pure hypercholesterolemia, unspecified: Secondary | ICD-10-CM | POA: Diagnosis not present

## 2017-06-09 DIAGNOSIS — I110 Hypertensive heart disease with heart failure: Secondary | ICD-10-CM | POA: Diagnosis not present

## 2017-06-09 DIAGNOSIS — E119 Type 2 diabetes mellitus without complications: Secondary | ICD-10-CM | POA: Diagnosis not present

## 2017-06-09 DIAGNOSIS — M79642 Pain in left hand: Secondary | ICD-10-CM | POA: Diagnosis not present

## 2017-06-09 DIAGNOSIS — Z794 Long term (current) use of insulin: Secondary | ICD-10-CM | POA: Diagnosis not present

## 2017-06-09 DIAGNOSIS — M109 Gout, unspecified: Secondary | ICD-10-CM | POA: Diagnosis not present

## 2017-08-03 DIAGNOSIS — N3001 Acute cystitis with hematuria: Secondary | ICD-10-CM | POA: Diagnosis not present

## 2017-08-27 DIAGNOSIS — M1711 Unilateral primary osteoarthritis, right knee: Secondary | ICD-10-CM | POA: Diagnosis not present

## 2017-08-27 DIAGNOSIS — M25561 Pain in right knee: Secondary | ICD-10-CM | POA: Diagnosis not present

## 2017-08-27 DIAGNOSIS — E119 Type 2 diabetes mellitus without complications: Secondary | ICD-10-CM | POA: Diagnosis not present

## 2017-08-27 DIAGNOSIS — Z7982 Long term (current) use of aspirin: Secondary | ICD-10-CM | POA: Diagnosis not present

## 2017-08-27 DIAGNOSIS — Z79899 Other long term (current) drug therapy: Secondary | ICD-10-CM | POA: Diagnosis not present

## 2017-08-27 DIAGNOSIS — I509 Heart failure, unspecified: Secondary | ICD-10-CM | POA: Diagnosis not present

## 2017-08-27 DIAGNOSIS — M1990 Unspecified osteoarthritis, unspecified site: Secondary | ICD-10-CM | POA: Diagnosis not present

## 2017-08-27 DIAGNOSIS — I110 Hypertensive heart disease with heart failure: Secondary | ICD-10-CM | POA: Diagnosis not present

## 2017-08-27 DIAGNOSIS — M25461 Effusion, right knee: Secondary | ICD-10-CM | POA: Diagnosis not present

## 2017-08-27 DIAGNOSIS — Z794 Long term (current) use of insulin: Secondary | ICD-10-CM | POA: Diagnosis not present

## 2017-08-27 DIAGNOSIS — M109 Gout, unspecified: Secondary | ICD-10-CM | POA: Diagnosis not present

## 2017-08-30 DIAGNOSIS — E781 Pure hyperglyceridemia: Secondary | ICD-10-CM | POA: Diagnosis not present

## 2017-08-30 DIAGNOSIS — E7800 Pure hypercholesterolemia, unspecified: Secondary | ICD-10-CM | POA: Diagnosis not present

## 2017-08-30 DIAGNOSIS — E782 Mixed hyperlipidemia: Secondary | ICD-10-CM | POA: Diagnosis not present

## 2017-08-30 DIAGNOSIS — E1122 Type 2 diabetes mellitus with diabetic chronic kidney disease: Secondary | ICD-10-CM | POA: Diagnosis not present

## 2017-08-30 DIAGNOSIS — E1165 Type 2 diabetes mellitus with hyperglycemia: Secondary | ICD-10-CM | POA: Diagnosis not present

## 2017-09-02 DIAGNOSIS — E782 Mixed hyperlipidemia: Secondary | ICD-10-CM | POA: Diagnosis not present

## 2017-09-02 DIAGNOSIS — E1122 Type 2 diabetes mellitus with diabetic chronic kidney disease: Secondary | ICD-10-CM | POA: Diagnosis not present

## 2017-09-02 DIAGNOSIS — N183 Chronic kidney disease, stage 3 (moderate): Secondary | ICD-10-CM | POA: Diagnosis not present

## 2017-09-02 DIAGNOSIS — M109 Gout, unspecified: Secondary | ICD-10-CM | POA: Diagnosis not present

## 2017-09-02 DIAGNOSIS — D509 Iron deficiency anemia, unspecified: Secondary | ICD-10-CM | POA: Diagnosis not present

## 2017-09-02 DIAGNOSIS — I10 Essential (primary) hypertension: Secondary | ICD-10-CM | POA: Diagnosis not present

## 2017-09-02 DIAGNOSIS — Z23 Encounter for immunization: Secondary | ICD-10-CM | POA: Diagnosis not present

## 2017-10-14 DIAGNOSIS — I129 Hypertensive chronic kidney disease with stage 1 through stage 4 chronic kidney disease, or unspecified chronic kidney disease: Secondary | ICD-10-CM | POA: Diagnosis not present

## 2017-10-14 DIAGNOSIS — Z79899 Other long term (current) drug therapy: Secondary | ICD-10-CM | POA: Diagnosis not present

## 2017-10-14 DIAGNOSIS — D509 Iron deficiency anemia, unspecified: Secondary | ICD-10-CM | POA: Diagnosis not present

## 2017-10-14 DIAGNOSIS — R809 Proteinuria, unspecified: Secondary | ICD-10-CM | POA: Diagnosis not present

## 2017-10-14 DIAGNOSIS — N183 Chronic kidney disease, stage 3 (moderate): Secondary | ICD-10-CM | POA: Diagnosis not present

## 2017-10-14 DIAGNOSIS — E559 Vitamin D deficiency, unspecified: Secondary | ICD-10-CM | POA: Diagnosis not present

## 2017-10-18 DIAGNOSIS — M170 Bilateral primary osteoarthritis of knee: Secondary | ICD-10-CM | POA: Diagnosis not present

## 2017-10-18 DIAGNOSIS — N3001 Acute cystitis with hematuria: Secondary | ICD-10-CM | POA: Diagnosis not present

## 2017-10-26 DIAGNOSIS — N183 Chronic kidney disease, stage 3 (moderate): Secondary | ICD-10-CM | POA: Diagnosis not present

## 2017-10-26 DIAGNOSIS — M1990 Unspecified osteoarthritis, unspecified site: Secondary | ICD-10-CM | POA: Diagnosis not present

## 2017-10-26 DIAGNOSIS — M19041 Primary osteoarthritis, right hand: Secondary | ICD-10-CM | POA: Diagnosis not present

## 2017-10-26 DIAGNOSIS — M109 Gout, unspecified: Secondary | ICD-10-CM | POA: Diagnosis not present

## 2017-10-26 DIAGNOSIS — M19042 Primary osteoarthritis, left hand: Secondary | ICD-10-CM | POA: Diagnosis not present

## 2017-10-26 DIAGNOSIS — M79641 Pain in right hand: Secondary | ICD-10-CM | POA: Diagnosis not present

## 2017-10-26 DIAGNOSIS — M79642 Pain in left hand: Secondary | ICD-10-CM | POA: Diagnosis not present

## 2017-10-26 DIAGNOSIS — E119 Type 2 diabetes mellitus without complications: Secondary | ICD-10-CM | POA: Diagnosis not present

## 2017-11-11 DIAGNOSIS — M1A9XX1 Chronic gout, unspecified, with tophus (tophi): Secondary | ICD-10-CM | POA: Diagnosis not present

## 2017-11-11 DIAGNOSIS — M109 Gout, unspecified: Secondary | ICD-10-CM | POA: Diagnosis not present

## 2017-11-22 DIAGNOSIS — M1A9XX1 Chronic gout, unspecified, with tophus (tophi): Secondary | ICD-10-CM | POA: Diagnosis not present

## 2017-12-06 DIAGNOSIS — M1A9XX1 Chronic gout, unspecified, with tophus (tophi): Secondary | ICD-10-CM | POA: Diagnosis not present

## 2017-12-20 DIAGNOSIS — M1A9XX1 Chronic gout, unspecified, with tophus (tophi): Secondary | ICD-10-CM | POA: Diagnosis not present

## 2018-01-03 DIAGNOSIS — M1A9XX1 Chronic gout, unspecified, with tophus (tophi): Secondary | ICD-10-CM | POA: Diagnosis not present

## 2018-01-04 DIAGNOSIS — M1A9XX1 Chronic gout, unspecified, with tophus (tophi): Secondary | ICD-10-CM | POA: Diagnosis not present

## 2018-01-13 DIAGNOSIS — M1990 Unspecified osteoarthritis, unspecified site: Secondary | ICD-10-CM | POA: Diagnosis not present

## 2018-01-13 DIAGNOSIS — N183 Chronic kidney disease, stage 3 (moderate): Secondary | ICD-10-CM | POA: Diagnosis not present

## 2018-01-13 DIAGNOSIS — E119 Type 2 diabetes mellitus without complications: Secondary | ICD-10-CM | POA: Diagnosis not present

## 2018-01-13 DIAGNOSIS — M1A9XX1 Chronic gout, unspecified, with tophus (tophi): Secondary | ICD-10-CM | POA: Diagnosis not present

## 2018-01-13 DIAGNOSIS — M109 Gout, unspecified: Secondary | ICD-10-CM | POA: Diagnosis not present

## 2018-01-16 DIAGNOSIS — E1165 Type 2 diabetes mellitus with hyperglycemia: Secondary | ICD-10-CM | POA: Diagnosis not present

## 2018-01-16 DIAGNOSIS — D509 Iron deficiency anemia, unspecified: Secondary | ICD-10-CM | POA: Diagnosis not present

## 2018-01-16 DIAGNOSIS — E7800 Pure hypercholesterolemia, unspecified: Secondary | ICD-10-CM | POA: Diagnosis not present

## 2018-01-16 DIAGNOSIS — D649 Anemia, unspecified: Secondary | ICD-10-CM | POA: Diagnosis not present

## 2018-01-16 DIAGNOSIS — E781 Pure hyperglyceridemia: Secondary | ICD-10-CM | POA: Diagnosis not present

## 2018-01-16 DIAGNOSIS — E1122 Type 2 diabetes mellitus with diabetic chronic kidney disease: Secondary | ICD-10-CM | POA: Diagnosis not present

## 2018-01-18 DIAGNOSIS — D509 Iron deficiency anemia, unspecified: Secondary | ICD-10-CM | POA: Diagnosis not present

## 2018-01-18 DIAGNOSIS — I10 Essential (primary) hypertension: Secondary | ICD-10-CM | POA: Diagnosis not present

## 2018-01-18 DIAGNOSIS — I5022 Chronic systolic (congestive) heart failure: Secondary | ICD-10-CM | POA: Diagnosis not present

## 2018-01-18 DIAGNOSIS — Z0001 Encounter for general adult medical examination with abnormal findings: Secondary | ICD-10-CM | POA: Diagnosis not present

## 2018-01-19 DIAGNOSIS — M1A9XX1 Chronic gout, unspecified, with tophus (tophi): Secondary | ICD-10-CM | POA: Diagnosis not present

## 2018-02-07 DIAGNOSIS — M1A9XX1 Chronic gout, unspecified, with tophus (tophi): Secondary | ICD-10-CM | POA: Diagnosis not present

## 2018-02-23 DIAGNOSIS — M1A9XX1 Chronic gout, unspecified, with tophus (tophi): Secondary | ICD-10-CM | POA: Diagnosis not present

## 2018-03-09 ENCOUNTER — Ambulatory Visit: Payer: Medicare Other | Admitting: Cardiology

## 2018-03-09 DIAGNOSIS — M1A9XX1 Chronic gout, unspecified, with tophus (tophi): Secondary | ICD-10-CM | POA: Diagnosis not present

## 2018-03-23 ENCOUNTER — Ambulatory Visit: Payer: Medicare Other | Admitting: Cardiology

## 2018-03-30 DIAGNOSIS — M1A9XX1 Chronic gout, unspecified, with tophus (tophi): Secondary | ICD-10-CM | POA: Diagnosis not present

## 2018-04-11 DIAGNOSIS — M1A9XX1 Chronic gout, unspecified, with tophus (tophi): Secondary | ICD-10-CM | POA: Diagnosis not present

## 2018-04-13 DIAGNOSIS — M1A9XX1 Chronic gout, unspecified, with tophus (tophi): Secondary | ICD-10-CM | POA: Diagnosis not present

## 2018-04-27 DIAGNOSIS — M1A9XX1 Chronic gout, unspecified, with tophus (tophi): Secondary | ICD-10-CM | POA: Diagnosis not present

## 2018-05-01 ENCOUNTER — Ambulatory Visit (INDEPENDENT_AMBULATORY_CARE_PROVIDER_SITE_OTHER): Payer: Medicare Other | Admitting: Cardiology

## 2018-05-01 ENCOUNTER — Encounter: Payer: Self-pay | Admitting: Cardiology

## 2018-05-01 VITALS — BP 122/70 | HR 82 | Ht 66.5 in | Wt 172.0 lb

## 2018-05-01 DIAGNOSIS — Z8679 Personal history of other diseases of the circulatory system: Secondary | ICD-10-CM

## 2018-05-01 DIAGNOSIS — I493 Ventricular premature depolarization: Secondary | ICD-10-CM | POA: Diagnosis not present

## 2018-05-01 DIAGNOSIS — I340 Nonrheumatic mitral (valve) insufficiency: Secondary | ICD-10-CM

## 2018-05-01 DIAGNOSIS — I10 Essential (primary) hypertension: Secondary | ICD-10-CM | POA: Diagnosis not present

## 2018-05-01 DIAGNOSIS — N183 Chronic kidney disease, stage 3 unspecified: Secondary | ICD-10-CM

## 2018-05-01 NOTE — Patient Instructions (Signed)

## 2018-05-01 NOTE — Progress Notes (Signed)
Cardiology Office Note  Date: 05/01/2018   ID: MALAYJAH OTOOLE, DOB May 03, 1946, MRN [id number]  PCP: Manon Hilding, MD  Primary Cardiologist: Rozann Lesches, MD   Chief Complaint  Patient presents with  . History of cardiomyopathy    History of Present Illness: Kayla Bell is a 72 y.o. female last seen in June 2018.  She presents for a routine follow-up visit.  She reports fatigue but stable dyspnea on exertion NYHA class II, no chest pain with activity.  She also has occasional palpitations, no unexplained syncope.  She continues to follow with Dr. Lowanda Foster, was seen in February.  Creatinine was 1.5 at that time.  She has continued on Cozaar, in fact dose was increased to 100 mg daily.  Follow-up echocardiogram in June of last year revealed LVEF 50 to 55% with basal inferior/inferoseptal hypokinesis, mild chamber dilatation, and moderate mitral regurgitation.  We discussed obtaining a follow-up study for review.  I personally reviewed her ECG today which shows a sinus rhythm with frequent PVCs.  Past Medical History:  Diagnosis Date  . Anemia   . Cardiomyopathy ([place])   . Chronic kidney disease, stage 3 ([place])   . Essential hypertension   . GERD (gastroesophageal reflux disease)   . Gout   . Hyperlipidemia   . Type 2 diabetes mellitus ([place])     Past Surgical History:  Procedure Laterality Date  . BREAST SURGERY Left   . PARTIAL HYSTERECTOMY    . RIGHT ARM SURGERY Right     Current Outpatient Medications  Medication Sig Dispense Refill  . aspirin 81 MG tablet Take 81 mg by mouth daily.    . carvedilol (COREG) 25 MG tablet Take 25 mg by mouth 2 (two) times daily with a meal.    . clonazePAM (KLONOPIN) 0.5 MG tablet Take 0.5 mg by mouth every 8 (eight) hours as needed for anxiety.     . colchicine 0.6 MG tablet Take 0.6 mg by mouth 4 (four) times daily as needed.    . ferrous sulfate 325 (65 FE) MG tablet Take 325 mg by mouth daily.     . furosemide  (LASIX) 20 MG tablet Take 20 mg by mouth daily.    . insulin glargine (LANTUS) 100 UNIT/ML injection Inject 55 Units into the skin daily.     . lansoprazole (PREVACID) 30 MG capsule Take 30 mg by mouth daily at 12 noon.    Marland Kitchen losartan (COZAAR) 100 MG tablet Take 100 mg by mouth daily.    . pravastatin (PRAVACHOL) 40 MG tablet Take 40 mg by mouth daily.    Marland Kitchen UNKNOWN TO PATIENT PT TAKES BENADRYL AND PREDNISONE AND ANOTHER MEDICATION NOT KNOWN TO PT VIA IV EVERY 2 WEEKS FOR GOUT     No current facility-administered medications for this visit.    Allergies:  Penicillins   Social History: The patient  reports that she has never smoked. She has never used smokeless tobacco.   ROS:  Please see the history of present illness. Otherwise, complete review of systems is positive for gouty arthritis, following with rheumatologist.  All other systems are reviewed and negative.    Physical Exam: VS:  BP 122/70   Pulse 82   Ht 5' 6.5" (1.689 m)   Wt 172 lb (78 kg)   SpO2 98%   BMI 27.35 kg/m , BMI Body mass index is 27.35 kg/m.  Wt Readings from Last 3 Encounters:  05/01/18 172 lb (78 kg)  02/24/17  163 lb 3.2 oz (74 kg)  01/30/16 143 lb 12.8 oz (65.2 kg)    General: Patient appears comfortable at rest. HEENT: Conjunctiva and lids normal, oropharynx clear. Neck: Supple, no elevated JVP or carotid bruits, no thyromegaly. Lungs: Clear to auscultation, nonlabored breathing at rest. Cardiac: Regular rate and rhythm with ectopy, no S3, soft apical systolic murmur. Abdomen: Soft, nontender, bowel sounds present. Extremities: No pitting edema, distal pulses 2+. Skin: Warm and dry. Musculoskeletal: No kyphosis. Neuropsychiatric: Alert and oriented x3, affect grossly appropriate.  ECG: I personally reviewed the tracing from 02/24/2017 which showed sinus rhythm with LVH and repolarization changes.  Recent Labwork:  April 2017: Hemoglobin 7.6-11.0, creatinine 1.4-2.0, NT pro-BNP 5801  Other Studies  Reviewed Today:  Echocardiogram 03/03/2017: Study Conclusions  - Left ventricle: LVEF is approximately 50 to 55% with severe   hypokinesis of the basal inferior/inferoseptal walls. The cavity   size was mildly dilated. Wall thickness was normal. The study is   not technically sufficient to allow evaluation of LV diastolic   function. - Mitral valve: There was moderate regurgitation. - Left atrium: The atrium was moderately dilated.  Assessment and Plan:  1.  History of cardiomyopathy with LVEF in the range of 50 to 55% associated with inferior/inferoseptal hypokinesis in June of last year.  She reports no chest pain and has stable NYHA class II dyspnea.  2.  Moderate mitral regurgitation, follow-up echocardiogram will be reviewed.  3.  CKD stage 3, last creatinine 1.5.  She continues to follow with Dr. Lowanda Foster.  4.  Palpitations, PVCs noted by ECG.  May need to consider further monitoring if LVEF has reduced further.  5.  Essential hypertension, continues on medical therapy with good blood pressure control today.  Current medicines were reviewed with the patient today.   Orders Placed This Encounter  Procedures  . EKG 12-Lead  . ECHOCARDIOGRAM COMPLETE    Disposition: Follow-up in 6 months.  Signed, Satira Sark, MD, [facility] 05/01/2018 2:43 PM    [place] [facility] [place], [place], [place] [address] Phone: [phone]; Fax: [phone]

## 2018-05-11 DIAGNOSIS — M1A9XX1 Chronic gout, unspecified, with tophus (tophi): Secondary | ICD-10-CM | POA: Diagnosis not present

## 2018-05-16 ENCOUNTER — Ambulatory Visit (INDEPENDENT_AMBULATORY_CARE_PROVIDER_SITE_OTHER): Payer: Medicare Other

## 2018-05-16 ENCOUNTER — Other Ambulatory Visit: Payer: Self-pay

## 2018-05-16 DIAGNOSIS — I340 Nonrheumatic mitral (valve) insufficiency: Secondary | ICD-10-CM | POA: Diagnosis not present

## 2018-05-16 DIAGNOSIS — R300 Dysuria: Secondary | ICD-10-CM | POA: Diagnosis not present

## 2018-05-17 ENCOUNTER — Telehealth: Payer: Self-pay | Admitting: *Deleted

## 2018-05-17 DIAGNOSIS — I5032 Chronic diastolic (congestive) heart failure: Secondary | ICD-10-CM

## 2018-05-17 NOTE — Telephone Encounter (Signed)
-----   Message from Satira Sark, MD sent at 05/16/2018 12:56 PM EDT ----- Results reviewed.  Compared to study from last year, LVEF has decreased, now in the range of 35 to 40%.  Mitral regurgitation remains moderate.  I would recommend that we modify her medications.  Suggest that we stop her Cozaar and switch to Entresto beginning at 49/51 mg twice daily.  Follow-up BMET in 2 weeks, and then office visit in 4 to 6 weeks. A copy of this test should be forwarded to Manon Hilding, MD.

## 2018-05-18 MED ORDER — SACUBITRIL-VALSARTAN 49-51 MG PO TABS: 1.0000 | ORAL_TABLET | Freq: Two times a day (BID) | ORAL | 3 refills | Status: DC

## 2018-05-18 NOTE — Telephone Encounter (Signed)
Patient informed and copy sent to PCP. 

## 2018-05-18 NOTE — Telephone Encounter (Signed)
Returned call

## 2018-05-18 NOTE — Telephone Encounter (Signed)
-----   Message from Satira Sark, MD sent at 05/16/2018 12:56 PM EDT ----- Results reviewed.  Compared to study from last year, LVEF has decreased, now in the range of 35 to 40%.  Mitral regurgitation remains moderate.  I would recommend that we modify her medications.  Suggest that we stop her Cozaar and switch to Entresto beginning at 49/51 mg twice daily.  Follow-up BMET in 2 weeks, and then office visit in 4 to 6 weeks. A copy of this test should be forwarded to Manon Hilding, MD.

## 2018-05-19 DIAGNOSIS — M25572 Pain in left ankle and joints of left foot: Secondary | ICD-10-CM | POA: Diagnosis not present

## 2018-05-19 DIAGNOSIS — M1990 Unspecified osteoarthritis, unspecified site: Secondary | ICD-10-CM | POA: Diagnosis not present

## 2018-05-19 DIAGNOSIS — M25472 Effusion, left ankle: Secondary | ICD-10-CM | POA: Diagnosis not present

## 2018-05-19 DIAGNOSIS — M1A9XX1 Chronic gout, unspecified, with tophus (tophi): Secondary | ICD-10-CM | POA: Diagnosis not present

## 2018-05-19 DIAGNOSIS — M109 Gout, unspecified: Secondary | ICD-10-CM | POA: Diagnosis not present

## 2018-05-25 DIAGNOSIS — M1A9XX1 Chronic gout, unspecified, with tophus (tophi): Secondary | ICD-10-CM | POA: Diagnosis not present

## 2018-05-29 DIAGNOSIS — D509 Iron deficiency anemia, unspecified: Secondary | ICD-10-CM | POA: Diagnosis not present

## 2018-05-29 DIAGNOSIS — N183 Chronic kidney disease, stage 3 (moderate): Secondary | ICD-10-CM | POA: Diagnosis not present

## 2018-05-29 DIAGNOSIS — E782 Mixed hyperlipidemia: Secondary | ICD-10-CM | POA: Diagnosis not present

## 2018-05-29 DIAGNOSIS — I5022 Chronic systolic (congestive) heart failure: Secondary | ICD-10-CM | POA: Diagnosis not present

## 2018-05-29 DIAGNOSIS — I10 Essential (primary) hypertension: Secondary | ICD-10-CM | POA: Diagnosis not present

## 2018-05-29 DIAGNOSIS — E1122 Type 2 diabetes mellitus with diabetic chronic kidney disease: Secondary | ICD-10-CM | POA: Diagnosis not present

## 2018-06-01 DIAGNOSIS — I5022 Chronic systolic (congestive) heart failure: Secondary | ICD-10-CM | POA: Diagnosis not present

## 2018-06-01 DIAGNOSIS — I10 Essential (primary) hypertension: Secondary | ICD-10-CM | POA: Diagnosis not present

## 2018-06-01 DIAGNOSIS — D509 Iron deficiency anemia, unspecified: Secondary | ICD-10-CM | POA: Diagnosis not present

## 2018-06-01 DIAGNOSIS — E782 Mixed hyperlipidemia: Secondary | ICD-10-CM | POA: Diagnosis not present

## 2018-06-01 DIAGNOSIS — M109 Gout, unspecified: Secondary | ICD-10-CM | POA: Diagnosis not present

## 2018-06-01 DIAGNOSIS — Z23 Encounter for immunization: Secondary | ICD-10-CM | POA: Diagnosis not present

## 2018-06-01 DIAGNOSIS — I5032 Chronic diastolic (congestive) heart failure: Secondary | ICD-10-CM | POA: Diagnosis not present

## 2018-06-05 ENCOUNTER — Encounter: Payer: Self-pay | Admitting: *Deleted

## 2018-06-06 ENCOUNTER — Telehealth: Payer: Self-pay | Admitting: *Deleted

## 2018-06-06 NOTE — Telephone Encounter (Signed)
-----   Message from Satira Sark, MD sent at 06/05/2018  8:24 AM EDT ----- Results reviewed.  BUN and creatinine are 37 and1.67, abnormal but looks to be chronic based on old lab work I have from 2017.  Please see if we can get a more recent creatinine from Dr. Quintin Alto.  Potassium high normal at 5.1.  Medications were recently modified including a switch from Cozaar to [place].  Continue same for now. A copy of this test should be forwarded to Manon Hilding, MD.

## 2018-06-06 NOTE — Telephone Encounter (Signed)
Patient informed. Copy sent to PCP °

## 2018-06-08 DIAGNOSIS — M1A9XX1 Chronic gout, unspecified, with tophus (tophi): Secondary | ICD-10-CM | POA: Diagnosis not present

## 2018-06-12 DIAGNOSIS — N3000 Acute cystitis without hematuria: Secondary | ICD-10-CM | POA: Diagnosis not present

## 2018-06-14 NOTE — Progress Notes (Signed)
Cardiology Office Note  Date: 06/15/2018   ID: Kayla Bell, DOB 09-05-1946, MRN [id number]  PCP: Manon Hilding, MD  Primary Cardiologist: Rozann Lesches, MD   Chief Complaint  Patient presents with  . Cardiomyopathy    History of Present Illness: Kayla Bell is a 72 y.o. female last seen in August.  She presents for a routine visit.  States that she has been doing relatively well, feels less full in her chest, somewhat less short of breath.  She has been using a stationary bicycle at home,going about 15 minutes at a time most days a week.  Echocardiogram done in September showed decrease in LVEF to the range of 35 to 40% with restrictive diastolic filling pattern and moderate mitral regurgitation.  Since last visit she was switched from Cozaar to [place].  Follow-up lab work is outlined below.  Creatinine relatively stable at 1.67.  She reports no palpitations, dizziness, or syncope.  Past Medical History:  Diagnosis Date  . Anemia   . Cardiomyopathy ([place])   . Chronic kidney disease, stage 3 ([place])   . Essential hypertension   . GERD (gastroesophageal reflux disease)   . Gout   . Hyperlipidemia   . Type 2 diabetes mellitus ([place])     Past Surgical History:  Procedure Laterality Date  . BREAST SURGERY Left   . PARTIAL HYSTERECTOMY    . RIGHT ARM SURGERY Right     Current Outpatient Medications  Medication Sig Dispense Refill  . aspirin 81 MG tablet Take 81 mg by mouth daily.    . carvedilol (COREG) 25 MG tablet Take 25 mg by mouth 2 (two) times daily with a meal.    . clonazePAM (KLONOPIN) 0.5 MG tablet Take 0.5 mg by mouth every 8 (eight) hours as needed for anxiety.     . colchicine 0.6 MG tablet Take 0.6 mg by mouth 4 (four) times daily as needed.    . ferrous sulfate 325 (65 FE) MG tablet Take 325 mg by mouth daily.     . furosemide (LASIX) 20 MG tablet Take 20 mg by mouth daily.    . insulin glargine (LANTUS) 100 UNIT/ML injection Inject 55  Units into the skin daily.     . lansoprazole (PREVACID) 30 MG capsule Take 30 mg by mouth daily at 12 noon.    . pravastatin (PRAVACHOL) 40 MG tablet Take 40 mg by mouth daily.    . sacubitril-valsartan (ENTRESTO) 49-51 MG Take 1 tablet by mouth 2 (two) times daily. Patient has voucher for 1st 30 days free 60 tablet 3  . UNKNOWN TO PATIENT PT TAKES BENADRYL AND PREDNISONE AND ANOTHER MEDICATION NOT KNOWN TO PT VIA IV EVERY 2 WEEKS FOR GOUT     No current facility-administered medications for this visit.    Allergies:  Penicillins   Social History: The patient  reports that she has never smoked. She has never used smokeless tobacco.   ROS:  Please see the history of present illness. Otherwise, complete review of systems is positive for none.  All other systems are reviewed and negative.   Physical Exam: VS:  BP 132/70   Pulse 83   Ht 5\' 7"  (1.702 m)   Wt 172 lb (78 kg)   SpO2 98%   BMI 26.94 kg/m , BMI Body mass index is 26.94 kg/m.  Wt Readings from Last 3 Encounters:  06/15/18 172 lb (78 kg)  05/01/18 172 lb (78 kg)  02/24/17 163 lb 3.2  oz (74 kg)    General: Patient appears comfortable at rest. HEENT: Conjunctiva and lids normal, oropharynx clear. Neck: Supple, no elevated JVP or carotid bruits, no thyromegaly. Lungs: Clear to auscultation, nonlabored breathing at rest. Cardiac: Regular rate and rhythm, no S3, soft apical systolic murmur. Abdomen: Soft, nontender, bowel sounds present. Extremities: No pitting edema, distal pulses 2+. Skin: Warm and dry. Musculoskeletal: No kyphosis. Neuropsychiatric: Alert and oriented x3, affect grossly appropriate.  ECG: I personally reviewed the tracing from 05/01/2018 which showed sinus rhythm with frequent PVCs.  Recent Labwork:  September 2019: BUN 37, creatinine 1.67, potassium 5.1  Other Studies Reviewed Today:  Echocardiogram 05/16/2018: Study Conclusions  - Left ventricle: The cavity size was normal. Wall thickness was    normal. Systolic function was moderately reduced. The estimated   ejection fraction was in the range of 35% to 40%. Diffuse   hypokinesis. Doppler parameters are consistent with restrictive   physiology, indicative of decreased left ventricular diastolic   compliance and/or increased left atrial pressure. Doppler   parameters are consistent with high ventricular filling pressure. - Aortic valve: Mildly calcified annulus. Trileaflet; mildly   thickened leaflets. Valve area (VTI): 2.07 cm^2. Valve area   (Vmax): 1.54 cm^2. Valve area (Vmean): 1.52 cm^2. - Mitral valve: Mildly calcified annulus. Mildly thickened leaflets   . There was moderate regurgitation. The MR vena contracta is 0.4   cm. - Left atrium: The atrium was severely dilated. - Atrial septum: No defect or patent foramen ovale was identified. - Pulmonary arteries: Systolic pressure was moderately increased.   PA peak pressure: 48 mm Hg (S). - Technically adequate study.  Assessment and Plan:  1.  Suspected nonischemic cardiomyopathy, LVEF 35 to 40% range as of September representing decreased compared to previous assessment.  Medications have been adjusted including a switch from ARB to [facility].  She feels better symptomatically.  Plan to continue current regimen and diuretic dosing.  Obtain echocardiogram and BMET for follow-up visit.  2.  Moderate mitral regurgitation.  3.  CKD stage 3, creatinine 1.5-1.6 recently.  She follows with Dr. Lowanda Foster.  4.  PVCs, continues on beta-blocker in the form of Coreg.  Current medicines were reviewed with the patient today.   Orders Placed This Encounter  Procedures  . Basic metabolic panel  . ECHOCARDIOGRAM COMPLETE    Disposition: Follow-up in 3 months.  Signed, Satira Sark, MD, [facility] 06/15/2018 1:26 PM    [facility] [place], [place], [place] [address] Phone: [phone]; Fax: [phone]

## 2018-06-15 ENCOUNTER — Encounter: Payer: Self-pay | Admitting: Cardiology

## 2018-06-15 ENCOUNTER — Ambulatory Visit (INDEPENDENT_AMBULATORY_CARE_PROVIDER_SITE_OTHER): Payer: Medicare Other | Admitting: Cardiology

## 2018-06-15 VITALS — BP 132/70 | HR 83 | Ht 67.0 in | Wt 172.0 lb

## 2018-06-15 DIAGNOSIS — I429 Cardiomyopathy, unspecified: Secondary | ICD-10-CM

## 2018-06-15 DIAGNOSIS — N183 Chronic kidney disease, stage 3 unspecified: Secondary | ICD-10-CM

## 2018-06-15 DIAGNOSIS — I340 Nonrheumatic mitral (valve) insufficiency: Secondary | ICD-10-CM | POA: Diagnosis not present

## 2018-06-15 DIAGNOSIS — I493 Ventricular premature depolarization: Secondary | ICD-10-CM | POA: Diagnosis not present

## 2018-06-15 NOTE — Patient Instructions (Addendum)
Medication Instructions:   Your physician recommends that you continue on your current medications as directed. Please refer to the Current Medication list given to you today.  Labwork:  Your physician recommends that you return for lab work in: 3 months just before your next visit to check your BMET.-lab work order given.  Testing/Procedures: Your physician has requested that you have an echocardiogram in 3 months just before your next visit. Echocardiography is a painless test that uses sound waves to create images of your heart. It provides your doctor with information about the size and shape of your heart and how well your heart's chambers and valves are working. This procedure takes approximately one hour. There are no restrictions for this procedure.  Follow-Up:  Your physician recommends that you schedule a follow-up appointment in: 3 months.  Any Other Special Instructions Will Be Listed Below (If Applicable).  If you need a refill on your cardiac medications before your next appointment, please call your pharmacy.

## 2018-06-18 DIAGNOSIS — K219 Gastro-esophageal reflux disease without esophagitis: Secondary | ICD-10-CM | POA: Diagnosis not present

## 2018-06-18 DIAGNOSIS — E119 Type 2 diabetes mellitus without complications: Secondary | ICD-10-CM | POA: Diagnosis not present

## 2018-06-18 DIAGNOSIS — N3001 Acute cystitis with hematuria: Secondary | ICD-10-CM | POA: Diagnosis not present

## 2018-06-18 DIAGNOSIS — R911 Solitary pulmonary nodule: Secondary | ICD-10-CM | POA: Diagnosis not present

## 2018-06-18 DIAGNOSIS — Z79899 Other long term (current) drug therapy: Secondary | ICD-10-CM | POA: Diagnosis not present

## 2018-06-18 DIAGNOSIS — Z794 Long term (current) use of insulin: Secondary | ICD-10-CM | POA: Diagnosis not present

## 2018-06-18 DIAGNOSIS — I110 Hypertensive heart disease with heart failure: Secondary | ICD-10-CM | POA: Diagnosis not present

## 2018-06-18 DIAGNOSIS — Z7982 Long term (current) use of aspirin: Secondary | ICD-10-CM | POA: Diagnosis not present

## 2018-06-18 DIAGNOSIS — Z880 Allergy status to penicillin: Secondary | ICD-10-CM | POA: Diagnosis not present

## 2018-06-18 DIAGNOSIS — E7800 Pure hypercholesterolemia, unspecified: Secondary | ICD-10-CM | POA: Diagnosis not present

## 2018-06-18 DIAGNOSIS — I509 Heart failure, unspecified: Secondary | ICD-10-CM | POA: Diagnosis not present

## 2018-06-18 DIAGNOSIS — I700 Atherosclerosis of aorta: Secondary | ICD-10-CM | POA: Diagnosis not present

## 2018-06-18 DIAGNOSIS — N390 Urinary tract infection, site not specified: Secondary | ICD-10-CM | POA: Diagnosis not present

## 2018-06-18 DIAGNOSIS — R109 Unspecified abdominal pain: Secondary | ICD-10-CM | POA: Diagnosis not present

## 2018-06-22 DIAGNOSIS — M1A9XX1 Chronic gout, unspecified, with tophus (tophi): Secondary | ICD-10-CM | POA: Diagnosis not present

## 2018-06-27 ENCOUNTER — Ambulatory Visit: Payer: Medicare Other | Admitting: Cardiology

## 2018-07-04 ENCOUNTER — Ambulatory Visit: Payer: Medicare Other | Admitting: Cardiology

## 2018-07-06 DIAGNOSIS — M1A9XX1 Chronic gout, unspecified, with tophus (tophi): Secondary | ICD-10-CM | POA: Diagnosis not present

## 2018-07-20 DIAGNOSIS — M109 Gout, unspecified: Secondary | ICD-10-CM | POA: Diagnosis not present

## 2018-07-20 DIAGNOSIS — M1A9XX1 Chronic gout, unspecified, with tophus (tophi): Secondary | ICD-10-CM | POA: Diagnosis not present

## 2018-08-03 DIAGNOSIS — M1A9XX1 Chronic gout, unspecified, with tophus (tophi): Secondary | ICD-10-CM | POA: Diagnosis not present

## 2018-08-17 DIAGNOSIS — M1A9XX1 Chronic gout, unspecified, with tophus (tophi): Secondary | ICD-10-CM | POA: Diagnosis not present

## 2018-08-18 DIAGNOSIS — M25472 Effusion, left ankle: Secondary | ICD-10-CM | POA: Diagnosis not present

## 2018-08-18 DIAGNOSIS — M1A9XX1 Chronic gout, unspecified, with tophus (tophi): Secondary | ICD-10-CM | POA: Diagnosis not present

## 2018-08-18 DIAGNOSIS — M109 Gout, unspecified: Secondary | ICD-10-CM | POA: Diagnosis not present

## 2018-08-18 DIAGNOSIS — M25572 Pain in left ankle and joints of left foot: Secondary | ICD-10-CM | POA: Diagnosis not present

## 2018-08-18 DIAGNOSIS — M1990 Unspecified osteoarthritis, unspecified site: Secondary | ICD-10-CM | POA: Diagnosis not present

## 2018-08-31 DIAGNOSIS — M1A9XX1 Chronic gout, unspecified, with tophus (tophi): Secondary | ICD-10-CM | POA: Diagnosis not present

## 2018-09-15 ENCOUNTER — Telehealth: Payer: Self-pay | Admitting: *Deleted

## 2018-09-15 DIAGNOSIS — I429 Cardiomyopathy, unspecified: Secondary | ICD-10-CM | POA: Diagnosis not present

## 2018-09-15 DIAGNOSIS — I340 Nonrheumatic mitral (valve) insufficiency: Secondary | ICD-10-CM | POA: Diagnosis not present

## 2018-09-15 NOTE — Telephone Encounter (Signed)
-----   Message from Kayla Bell, [place] sent at 09/15/2018  4:37 PM EST ----- Covering for Dr. Domenic Polite - Please let the patient know her kidney function remains stable with creatinine at 1.59 (known Stage 3 CKD). Electrolytes within a normal range. Please forward a copy of results to Manon Hilding, MD. Thank you.

## 2018-09-15 NOTE — Telephone Encounter (Signed)
Patient informed and copy sent to PCP and rheumatologist.

## 2018-09-19 ENCOUNTER — Telehealth: Payer: Self-pay | Admitting: Cardiology

## 2018-09-19 ENCOUNTER — Other Ambulatory Visit: Payer: Self-pay | Admitting: Cardiology

## 2018-09-19 MED ORDER — SACUBITRIL-VALSARTAN 49-51 MG PO TABS: 1.0000 | ORAL_TABLET | Freq: Two times a day (BID) | ORAL | 0 refills | Status: DC

## 2018-09-19 MED ORDER — SACUBITRIL-VALSARTAN 49-51 MG PO TABS: 1.0000 | ORAL_TABLET | Freq: Two times a day (BID) | ORAL | 6 refills | Status: DC

## 2018-09-19 NOTE — Telephone Encounter (Signed)
Patient said her entresto is now costing $142.73/mth. Patient said she did not change insurances but she has not me her deductible and will not be able to afford this cost per month. Patient advised that samples will be provided and patient assistance application will be initiated. Patient will bring proof of income and sign application when she pick her samples up tomorrow.

## 2018-09-19 NOTE — Telephone Encounter (Signed)
Patient informed that she is to stay on entresto and that refills would be sent to her pharmacy. Verbalized understanding.

## 2018-09-19 NOTE — Telephone Encounter (Signed)
sacubitril-valsartan (ENTRESTO) 49-51 MG     Patient asking if Dr Domenic Polite would like for her to continue with medication.  If so she will need refills sent in [facility]. She has about a week supply left

## 2018-09-19 NOTE — Telephone Encounter (Signed)
Patient called stating that she cannot afford Entresto.

## 2018-09-22 DIAGNOSIS — I10 Essential (primary) hypertension: Secondary | ICD-10-CM | POA: Diagnosis not present

## 2018-09-22 DIAGNOSIS — D649 Anemia, unspecified: Secondary | ICD-10-CM | POA: Diagnosis not present

## 2018-09-22 DIAGNOSIS — E11649 Type 2 diabetes mellitus with hypoglycemia without coma: Secondary | ICD-10-CM | POA: Diagnosis not present

## 2018-09-22 DIAGNOSIS — N183 Chronic kidney disease, stage 3 (moderate): Secondary | ICD-10-CM | POA: Diagnosis not present

## 2018-09-22 DIAGNOSIS — E875 Hyperkalemia: Secondary | ICD-10-CM | POA: Diagnosis not present

## 2018-09-22 DIAGNOSIS — E1122 Type 2 diabetes mellitus with diabetic chronic kidney disease: Secondary | ICD-10-CM | POA: Diagnosis not present

## 2018-09-25 DIAGNOSIS — N183 Chronic kidney disease, stage 3 (moderate): Secondary | ICD-10-CM | POA: Diagnosis not present

## 2018-09-25 DIAGNOSIS — I10 Essential (primary) hypertension: Secondary | ICD-10-CM | POA: Diagnosis not present

## 2018-09-25 DIAGNOSIS — I5022 Chronic systolic (congestive) heart failure: Secondary | ICD-10-CM | POA: Diagnosis not present

## 2018-09-25 DIAGNOSIS — D509 Iron deficiency anemia, unspecified: Secondary | ICD-10-CM | POA: Diagnosis not present

## 2018-09-28 ENCOUNTER — Telehealth: Payer: Self-pay | Admitting: *Deleted

## 2018-09-28 ENCOUNTER — Other Ambulatory Visit: Payer: Self-pay

## 2018-09-28 ENCOUNTER — Ambulatory Visit (INDEPENDENT_AMBULATORY_CARE_PROVIDER_SITE_OTHER): Payer: Medicare Other

## 2018-09-28 DIAGNOSIS — I340 Nonrheumatic mitral (valve) insufficiency: Secondary | ICD-10-CM | POA: Diagnosis not present

## 2018-09-28 DIAGNOSIS — I429 Cardiomyopathy, unspecified: Secondary | ICD-10-CM

## 2018-09-28 NOTE — Telephone Encounter (Signed)
Patient informed and copy sent to PCP. 

## 2018-09-28 NOTE — Telephone Encounter (Signed)
-----   Message from Satira Sark, MD sent at 09/28/2018 12:57 PM EST ----- Results reviewed.  LVEF has improved to the range of 40 to 45% following her previous medication adjustments.  Mitral regurgitation is only mild at this time.  Continue with current follow-up plan. A copy of this test should be forwarded to Valinda Party, MD.

## 2018-10-04 DIAGNOSIS — N3000 Acute cystitis without hematuria: Secondary | ICD-10-CM | POA: Diagnosis not present

## 2018-10-06 ENCOUNTER — Ambulatory Visit (INDEPENDENT_AMBULATORY_CARE_PROVIDER_SITE_OTHER): Payer: Medicare Other | Admitting: Cardiology

## 2018-10-06 ENCOUNTER — Encounter: Payer: Self-pay | Admitting: Cardiology

## 2018-10-06 VITALS — BP 126/71 | HR 80 | Ht 67.0 in | Wt 177.2 lb

## 2018-10-06 DIAGNOSIS — Z79899 Other long term (current) drug therapy: Secondary | ICD-10-CM

## 2018-10-06 DIAGNOSIS — I493 Ventricular premature depolarization: Secondary | ICD-10-CM

## 2018-10-06 DIAGNOSIS — I340 Nonrheumatic mitral (valve) insufficiency: Secondary | ICD-10-CM | POA: Diagnosis not present

## 2018-10-06 DIAGNOSIS — N183 Chronic kidney disease, stage 3 unspecified: Secondary | ICD-10-CM

## 2018-10-06 DIAGNOSIS — I429 Cardiomyopathy, unspecified: Secondary | ICD-10-CM

## 2018-10-06 MED ORDER — SACUBITRIL-VALSARTAN 49-51 MG PO TABS: 1.0000 | ORAL_TABLET | Freq: Two times a day (BID) | ORAL | 0 refills | Status: DC

## 2018-10-06 NOTE — Progress Notes (Signed)
Cardiology Office Note  Date: 10/06/2018   ID: Kayla Bell, DOB [birth year], MRN [id number]  PCP: Manon Hilding, MD  Primary Cardiologist: Rozann Lesches, MD   Chief Complaint  Patient presents with  . Cardiomyopathy    History of Present Illness: Kayla Bell is a 73 y.o. female last seen in October 2019.  She is here for a follow-up visit.  States that she feels better, NYHA class II dyspnea, no palpitations or syncope.  She does not report any orthopnea or PND, no leg swelling.  Recent follow-up echocardiogram showed improvement in LVEF to the range of 40 to 45% after switch to Entresto from losartan.  Follow-up lab work is reviewed below.  No other changes to her current regimen which is outlined below.  Past Medical History:  Diagnosis Date  . Anemia   . Cardiomyopathy ([place])   . Chronic kidney disease, stage 3 ([place])   . Essential hypertension   . GERD (gastroesophageal reflux disease)   . Gout   . Hyperlipidemia   . Type 2 diabetes mellitus ([place])     Past Surgical History:  Procedure Laterality Date  . BREAST SURGERY Left   . PARTIAL HYSTERECTOMY    . RIGHT ARM SURGERY Right     Current Outpatient Medications  Medication Sig Dispense Refill  . aspirin 81 MG tablet Take 81 mg by mouth daily.    . carvedilol (COREG) 25 MG tablet Take 25 mg by mouth 2 (two) times daily with a meal.    . clonazePAM (KLONOPIN) 0.5 MG tablet Take 0.5 mg by mouth every 8 (eight) hours as needed for anxiety.     . colchicine 0.6 MG tablet Take 0.6 mg by mouth 4 (four) times daily as needed.    . ferrous sulfate 325 (65 FE) MG tablet Take 325 mg by mouth daily.     . furosemide (LASIX) 20 MG tablet Take 20 mg by mouth daily.    . insulin glargine (LANTUS) 100 UNIT/ML injection Inject 55 Units into the skin daily.     . lansoprazole (PREVACID) 30 MG capsule Take 30 mg by mouth daily at 12 noon.    . pravastatin (PRAVACHOL) 40 MG tablet Take 40 mg by mouth daily.    .  sacubitril-valsartan (ENTRESTO) 49-51 MG Take 1 tablet by mouth 2 (two) times daily. Patient has voucher for 1st 30 days free 56 tablet 0  . UNKNOWN TO PATIENT PT TAKES BENADRYL AND PREDNISONE AND ANOTHER MEDICATION NOT KNOWN TO PT VIA IV EVERY 2 WEEKS FOR GOUT     No current facility-administered medications for this visit.    Allergies:  Penicillins   Social History: The patient  reports that she has never smoked. She has never used smokeless tobacco.   ROS:  Please see the history of present illness. Otherwise, complete review of systems is positive for no active gout symptoms.  All other systems are reviewed and negative.   Physical Exam: VS:  BP 126/71   Pulse 80   Ht 5\' 7"  (1.702 m)   Wt 177 lb 3.2 oz (80.4 kg)   SpO2 98%   BMI 27.75 kg/m , BMI Body mass index is 27.75 kg/m.  Wt Readings from Last 3 Encounters:  10/06/18 177 lb 3.2 oz (80.4 kg)  06/15/18 172 lb (78 kg)  05/01/18 172 lb (78 kg)    General: Patient appears comfortable at rest. HEENT: Conjunctiva and lids normal, oropharynx clear. Neck: Supple, no elevated JVP  or carotid bruits, no thyromegaly. Lungs: Clear to auscultation, nonlabored breathing at rest. Cardiac: Regular rate and rhythm, no S3, soft apical systolic murmur. Abdomen: Soft, nontender, bowel sounds present. Extremities: No pitting edema, distal pulses 2+. Skin: Warm and dry. Musculoskeletal: No kyphosis. Neuropsychiatric: Alert and oriented x3, affect grossly appropriate.  ECG: I personally reviewed the tracing from 05/01/2018 which showed sinus rhythm with frequent PVCs.  Recent Labwork:  January 2020: BUN 32, creatinine 1.59, sodium 140, potassium 4.1  Other Studies Reviewed Today:  Echocardiogram 09/28/2018: Study Conclusions  - Left ventricle: The cavity size was normal. Wall thickness was   normal. Systolic function was mildly to moderately reduced. The   estimated ejection fraction was in the range of 40% to 45%.   Diastolic  function is indeterminant. Diffuse hypokinesis. - Aortic valve: Mildly calcified annulus. Trileaflet; mildly   thickened leaflets. Valve area (VTI): 1.81 cm^2. Valve area   (Vmax): 1.73 cm^2. Valve area (Vmean): 1.77 cm^2. - Mitral valve: Mildly calcified annulus. Mildly thickened leaflets   . There was mild regurgitation. The MR vena contracta is 0.2 cm.   Valve area by continuity equation (using LVOT flow): 1.72 cm^2. - Left atrium: The atrium was mildly to moderately dilated. - Atrial septum: No defect or patent foramen ovale was identified.  Assessment and Plan:  1.  Nonischemic cardiomyopathy, LVEF to the range of 40 to 45%.  Plan to continue current regimen, she tolerated switch from Cozaar to [place].  Follow-up BMET for her next visit.  2.  CKD stage III, creatinine stable at 1.59 and normal potassium.  3.  PVCs, remains on Coreg, asymptomatic at this time.  4.  Mild to moderate mitral regurgitation.  Current medicines were reviewed with the patient today.   Orders Placed This Encounter  Procedures  . Basic metabolic panel    Disposition: Follow-up 4 months.  Signed, Satira Sark, MD, [facility] 10/06/2018 [facility] [place], [place], [place] [address] Phone: [phone]; Fax: [phone]

## 2018-10-06 NOTE — Patient Instructions (Addendum)
Medication Instructions:   Your physician recommends that you continue on your current medications as directed. Please refer to the Current Medication list given to you today.  Labwork:  Your physician recommends that you return for lab work in: 4 months just before your next visit to check your BMET.  Testing/Procedures:  NONE  Follow-Up:  Your physician recommends that you schedule a follow-up appointment in: 4 months.  Any Other Special Instructions Will Be Listed Below (If Applicable).  If you need a refill on your cardiac medications before your next appointment, please call your pharmacy.

## 2018-10-09 ENCOUNTER — Telehealth: Payer: Self-pay | Admitting: *Deleted

## 2018-10-09 NOTE — Telephone Encounter (Signed)
Patient informed that notification received today from [employer] patient assistance foundation that she has been approved to receive entresto for the remainder of the calendar year.

## 2018-10-12 DIAGNOSIS — N183 Chronic kidney disease, stage 3 (moderate): Secondary | ICD-10-CM | POA: Diagnosis not present

## 2018-10-12 DIAGNOSIS — D649 Anemia, unspecified: Secondary | ICD-10-CM | POA: Diagnosis not present

## 2018-10-12 DIAGNOSIS — E1122 Type 2 diabetes mellitus with diabetic chronic kidney disease: Secondary | ICD-10-CM | POA: Diagnosis not present

## 2018-10-12 DIAGNOSIS — I5032 Chronic diastolic (congestive) heart failure: Secondary | ICD-10-CM | POA: Diagnosis not present

## 2018-10-12 DIAGNOSIS — R809 Proteinuria, unspecified: Secondary | ICD-10-CM | POA: Diagnosis not present

## 2018-10-12 DIAGNOSIS — E1129 Type 2 diabetes mellitus with other diabetic kidney complication: Secondary | ICD-10-CM | POA: Diagnosis not present

## 2018-10-12 DIAGNOSIS — I129 Hypertensive chronic kidney disease with stage 1 through stage 4 chronic kidney disease, or unspecified chronic kidney disease: Secondary | ICD-10-CM | POA: Diagnosis not present

## 2018-10-17 DIAGNOSIS — E1121 Type 2 diabetes mellitus with diabetic nephropathy: Secondary | ICD-10-CM | POA: Diagnosis not present

## 2018-10-17 DIAGNOSIS — R809 Proteinuria, unspecified: Secondary | ICD-10-CM | POA: Diagnosis not present

## 2018-10-17 DIAGNOSIS — D649 Anemia, unspecified: Secondary | ICD-10-CM | POA: Diagnosis not present

## 2018-10-17 DIAGNOSIS — I5030 Unspecified diastolic (congestive) heart failure: Secondary | ICD-10-CM | POA: Diagnosis not present

## 2018-10-17 DIAGNOSIS — N183 Chronic kidney disease, stage 3 (moderate): Secondary | ICD-10-CM | POA: Diagnosis not present

## 2018-10-24 DIAGNOSIS — M79675 Pain in left toe(s): Secondary | ICD-10-CM | POA: Diagnosis not present

## 2018-10-24 DIAGNOSIS — L600 Ingrowing nail: Secondary | ICD-10-CM | POA: Diagnosis not present

## 2018-10-24 DIAGNOSIS — I509 Heart failure, unspecified: Secondary | ICD-10-CM | POA: Diagnosis not present

## 2018-10-24 DIAGNOSIS — Z794 Long term (current) use of insulin: Secondary | ICD-10-CM | POA: Diagnosis not present

## 2018-10-24 DIAGNOSIS — Z880 Allergy status to penicillin: Secondary | ICD-10-CM | POA: Diagnosis not present

## 2018-10-24 DIAGNOSIS — E119 Type 2 diabetes mellitus without complications: Secondary | ICD-10-CM | POA: Diagnosis not present

## 2018-10-24 DIAGNOSIS — Z79899 Other long term (current) drug therapy: Secondary | ICD-10-CM | POA: Diagnosis not present

## 2018-10-24 DIAGNOSIS — E7800 Pure hypercholesterolemia, unspecified: Secondary | ICD-10-CM | POA: Diagnosis not present

## 2018-10-24 DIAGNOSIS — W228XXA Striking against or struck by other objects, initial encounter: Secondary | ICD-10-CM | POA: Diagnosis not present

## 2018-10-24 DIAGNOSIS — K219 Gastro-esophageal reflux disease without esophagitis: Secondary | ICD-10-CM | POA: Diagnosis not present

## 2018-10-24 DIAGNOSIS — I110 Hypertensive heart disease with heart failure: Secondary | ICD-10-CM | POA: Diagnosis not present

## 2018-10-24 DIAGNOSIS — Z8639 Personal history of other endocrine, nutritional and metabolic disease: Secondary | ICD-10-CM | POA: Diagnosis not present

## 2018-11-06 DIAGNOSIS — H6593 Unspecified nonsuppurative otitis media, bilateral: Secondary | ICD-10-CM | POA: Diagnosis not present

## 2018-11-06 DIAGNOSIS — J069 Acute upper respiratory infection, unspecified: Secondary | ICD-10-CM | POA: Diagnosis not present

## 2019-01-19 DIAGNOSIS — E781 Pure hyperglyceridemia: Secondary | ICD-10-CM | POA: Diagnosis not present

## 2019-01-19 DIAGNOSIS — I10 Essential (primary) hypertension: Secondary | ICD-10-CM | POA: Diagnosis not present

## 2019-01-19 DIAGNOSIS — E1165 Type 2 diabetes mellitus with hyperglycemia: Secondary | ICD-10-CM | POA: Diagnosis not present

## 2019-01-19 DIAGNOSIS — E782 Mixed hyperlipidemia: Secondary | ICD-10-CM | POA: Diagnosis not present

## 2019-01-19 DIAGNOSIS — E1122 Type 2 diabetes mellitus with diabetic chronic kidney disease: Secondary | ICD-10-CM | POA: Diagnosis not present

## 2019-01-19 DIAGNOSIS — E11649 Type 2 diabetes mellitus with hypoglycemia without coma: Secondary | ICD-10-CM | POA: Diagnosis not present

## 2019-01-24 DIAGNOSIS — I10 Essential (primary) hypertension: Secondary | ICD-10-CM | POA: Diagnosis not present

## 2019-01-24 DIAGNOSIS — E782 Mixed hyperlipidemia: Secondary | ICD-10-CM | POA: Diagnosis not present

## 2019-01-24 DIAGNOSIS — E1122 Type 2 diabetes mellitus with diabetic chronic kidney disease: Secondary | ICD-10-CM | POA: Diagnosis not present

## 2019-01-24 DIAGNOSIS — I5022 Chronic systolic (congestive) heart failure: Secondary | ICD-10-CM | POA: Diagnosis not present

## 2019-01-24 DIAGNOSIS — D509 Iron deficiency anemia, unspecified: Secondary | ICD-10-CM | POA: Diagnosis not present

## 2019-02-07 DIAGNOSIS — I429 Cardiomyopathy, unspecified: Secondary | ICD-10-CM | POA: Diagnosis not present

## 2019-02-07 DIAGNOSIS — Z79899 Other long term (current) drug therapy: Secondary | ICD-10-CM | POA: Diagnosis not present

## 2019-02-08 ENCOUNTER — Telehealth: Payer: Self-pay | Admitting: *Deleted

## 2019-02-08 NOTE — Telephone Encounter (Signed)
Patient verbally consented for tele-health visits with [facility] and understands that her insurance company will be billed for the encounter.  Aware to have vitals available   

## 2019-02-08 NOTE — Telephone Encounter (Signed)
Patient informed. Copy sent to PCP °

## 2019-02-08 NOTE — Telephone Encounter (Signed)
-----   Message from Satira Sark, MD sent at 02/08/2019  9:26 AM EDT ----- Results reviewed.  Creatinine has increased to 2.0 from 1.5.  We will need to discuss fluid status and diuretic at her follow-up visit.

## 2019-02-11 NOTE — Progress Notes (Signed)
Virtual Visit via Video Note   This visit type was conducted due to national recommendations for restrictions regarding the COVID-19 Pandemic (e.g. social distancing) in an effort to limit this patient's exposure and mitigate transmission in our community.  Due to her co-morbid illnesses, this patient is at least at moderate risk for complications without adequate follow up.  This format is felt to be most appropriate for this patient at this time.  All issues noted in this document were discussed and addressed.  A limited physical exam was performed with this format.  Please refer to the patient's chart for her consent to telehealth for [facility].   Date:  02/12/2019   ID:  Kayla Bell, DOB Jan 17, 1946, MRN [id number]  Patient Location: Home Provider Location: Office  PCP:  Kayla Hilding, MD  Cardiologist:  Kayla Lesches, MD Electrophysiologist:  None   Evaluation Performed:  Follow-Up Visit  Chief Complaint:  Cardiac follow-up  History of Present Illness:    Kayla Bell is a 73 y.o. female last seen in January.  We communicated by video conferencing today.  She tells me that she has been doing well overall, reports NYHA class II dyspnea, no progressive leg swelling.  Blood pressures have been low normal range, she was initially somewhat lightheaded but this has resolved.  She has had no palpitations or syncope.  Recent lab work showed creatinine up to 2.0 from 1.5 as outlined below.  I talked with her about cutting her Lasix back to only as needed at this time and tracking weight daily.  We went over her remaining medications which are stable.  She does not report any orthopnea or PND.  The patient does not have symptoms concerning for COVID-19 infection (fever, chills, cough, or new shortness of breath).  She states that she has been social distancing.   Past Medical History:  Diagnosis Date  . Anemia   . Cardiomyopathy ([place])   . Chronic kidney disease,  stage 3 ([place])   . Essential hypertension   . GERD (gastroesophageal reflux disease)   . Gout   . Hyperlipidemia   . Type 2 diabetes mellitus ([place])    Past Surgical History:  Procedure Laterality Date  . BREAST SURGERY Left   . PARTIAL HYSTERECTOMY    . RIGHT ARM SURGERY Right      Current Meds  Medication Sig  . allopurinol (ZYLOPRIM) 100 MG tablet Take 1 tablet by mouth daily.  Marland Kitchen amLODipine (NORVASC) 5 MG tablet Take 1 tablet by mouth daily.  Marland Kitchen aspirin 81 MG tablet Take 81 mg by mouth daily.  . carvedilol (COREG) 25 MG tablet Take 25 mg by mouth 2 (two) times daily with a meal.  . clonazePAM (KLONOPIN) 0.5 MG tablet Take 0.5 mg by mouth every 8 (eight) hours as needed for anxiety.   . colchicine 0.6 MG tablet Take 0.6 mg by mouth 4 (four) times daily as needed.  . fluticasone (FLONASE) 50 MCG/ACT nasal spray Place 1 spray into both nostrils daily.  . furosemide (LASIX) 20 MG tablet Take 1 tablet (20 mg total) by mouth daily as needed (based on fluid and weight).  . insulin glargine (LANTUS) 100 UNIT/ML injection Inject 55 Units into the skin daily.   . lansoprazole (PREVACID) 30 MG capsule Take 30 mg by mouth daily at 12 noon.  . pravastatin (PRAVACHOL) 40 MG tablet Take 40 mg by mouth daily.  . sacubitril-valsartan (ENTRESTO) 49-51 MG Take 1 tablet by mouth 2 (two)  times daily. Patient has voucher for 1st 30 days free  . [DISCONTINUED] furosemide (LASIX) 20 MG tablet Take 20 mg by mouth daily.     Allergies:   Penicillins   Social History   Tobacco Use  . Smoking status: Never Smoker  . Smokeless tobacco: Never Used  Substance Use Topics  . Alcohol use: Not on file  . Drug use: Not on file     Family Hx: The patient's family history includes Heart disease in her brother, sister, and sister; Heart failure in her son; Kidney disease in her sister.  ROS:   Please see the history of present illness.    All other systems reviewed and are negative.   Prior CV studies:    The following studies were reviewed today:  Echocardiogram 09/28/2018:  Study Conclusions  - Left ventricle: The cavity size was normal. Wall thickness was   normal. Systolic function was mildly to moderately reduced. The   estimated ejection fraction was in the range of 40% to 45%.   Diastolic function is indeterminant. Diffuse hypokinesis. - Aortic valve: Mildly calcified annulus. Trileaflet; mildly   thickened leaflets. Valve area (VTI): 1.81 cm^2. Valve area   (Vmax): 1.73 cm^2. Valve area (Vmean): 1.77 cm^2. - Mitral valve: Mildly calcified annulus. Mildly thickened leaflets   . There was mild regurgitation. The MR vena contracta is 0.2 cm.   Valve area by continuity equation (using LVOT flow): 1.72 cm^2. - Left atrium: The atrium was mildly to moderately dilated. - Atrial septum: No defect or patent foramen ovale was identified.  Labs/Other Tests and Data Reviewed:    EKG:  An ECG dated 05/01/2018 was personally reviewed today and demonstrated:  Sinus rhythm with frequent PVCs.  Recent Labs:  May 2020: BUN 33, creatinine 2.0, potassium 4.9  Wt Readings from Last 3 Encounters:  02/12/19 173 lb (78.5 kg)  10/06/18 177 lb 3.2 oz (80.4 kg)  06/15/18 172 lb (78 kg)     Objective:    Vital Signs:  BP 115/65   Pulse 80   Ht 5\' 7"  (1.702 m)   Wt 173 lb (78.5 kg)   BMI 27.10 kg/m    General: Patient appears comfortable at rest. HEENT: Conjunctiva and lids normal. Lungs: Patient spoke in full sentences, not short of breath.  No audible wheezing or coughing. Skin: Normal appearance of color and turgor. Neuropsychiatric: Gaze conjugate.  Speech pattern normal.  Patient moves all extremities.  Affect appropriate.   ASSESSMENT & PLAN:    1.  Systolic heart failure, symptomatically stable.  Weight is down about 4 pounds.  I did talk with her about using Lasix only on an as-needed basis at this time.  She knows to weigh herself daily.  2.  Nonischemic cardiomyopathy,  tolerating current medical regimen well.  Blood pressure low normal range.  Continue Entresto and Coreg.  No Aldactone with creatinine 2.0.  3.  CKD stage III, recent follow-up creatinine 2.0.  We are cutting Lasix back to only as needed at this point.  Follow-up BMET for next visit.  4.  Mild mitral regurgitation by last assessment in January.  COVID-19 Education: The signs and symptoms of COVID-19 were discussed with the patient and how to seek care for testing (follow up with PCP or arrange E-visit).  The importance of social distancing was discussed today.  Time:   Today, I have spent 8 minutes with the patient with telehealth technology discussing the above problems.     Medication Adjustments/Labs  and Tests Ordered: Current medicines are reviewed at length with the patient today.  Concerns regarding medicines are outlined above.   Tests Ordered: Orders Placed This Encounter  Procedures  . Basic metabolic panel    Medication Changes: Meds ordered this encounter  Medications  . furosemide (LASIX) 20 MG tablet    Sig: Take 1 tablet (20 mg total) by mouth daily as needed (based on fluid and weight).    Dispense:  30 tablet    Refill:  1    02/12/2019 change in directions    Disposition:  Follow up 3 months in the [place] office.   Signed, Kayla Lesches, MD  02/12/2019 9:48 AM    [place]

## 2019-02-12 ENCOUNTER — Encounter: Payer: Self-pay | Admitting: Cardiology

## 2019-02-12 ENCOUNTER — Telehealth (INDEPENDENT_AMBULATORY_CARE_PROVIDER_SITE_OTHER): Payer: Medicare Other | Admitting: Cardiology

## 2019-02-12 VITALS — BP 115/65 | HR 80 | Ht 67.0 in | Wt 173.0 lb

## 2019-02-12 DIAGNOSIS — I429 Cardiomyopathy, unspecified: Secondary | ICD-10-CM

## 2019-02-12 DIAGNOSIS — Z7189 Other specified counseling: Secondary | ICD-10-CM

## 2019-02-12 DIAGNOSIS — I5042 Chronic combined systolic (congestive) and diastolic (congestive) heart failure: Secondary | ICD-10-CM

## 2019-02-12 DIAGNOSIS — I340 Nonrheumatic mitral (valve) insufficiency: Secondary | ICD-10-CM | POA: Diagnosis not present

## 2019-02-12 DIAGNOSIS — N183 Chronic kidney disease, stage 3 unspecified: Secondary | ICD-10-CM

## 2019-02-12 MED ORDER — FUROSEMIDE 20 MG PO TABS: 20.0000 mg | ORAL_TABLET | Freq: Every day | ORAL | 1 refills | Status: DC | PRN

## 2019-02-12 NOTE — Patient Instructions (Addendum)
Medication Instructions:   Your physician has recommended you make the following change in your medication:   Change furosemide 20 mg to daily as needed based on fluid or weight changes  Continue all other medication the same  Labwork:  Your physician recommends that you return for lab work in: 3 months just before your next visit to check your BMET. Please have this done at [facility].   Testing/Procedures:  NONE  Follow-Up:  Your physician recommends that you schedule a follow-up appointment in: 3 month office visit with Dr. Domenic Polite  Any Other Special Instructions Will Be Listed Below (If Applicable).  If you need a refill on your cardiac medications before your next appointment, please call your pharmacy.

## 2019-02-13 DIAGNOSIS — M1A9XX1 Chronic gout, unspecified, with tophus (tophi): Secondary | ICD-10-CM | POA: Diagnosis not present

## 2019-02-13 DIAGNOSIS — M25472 Effusion, left ankle: Secondary | ICD-10-CM | POA: Diagnosis not present

## 2019-02-13 DIAGNOSIS — M109 Gout, unspecified: Secondary | ICD-10-CM | POA: Diagnosis not present

## 2019-02-13 DIAGNOSIS — M1990 Unspecified osteoarthritis, unspecified site: Secondary | ICD-10-CM | POA: Diagnosis not present

## 2019-02-13 DIAGNOSIS — M25572 Pain in left ankle and joints of left foot: Secondary | ICD-10-CM | POA: Diagnosis not present

## 2019-02-16 DIAGNOSIS — M1A9XX1 Chronic gout, unspecified, with tophus (tophi): Secondary | ICD-10-CM | POA: Diagnosis not present

## 2019-03-08 DIAGNOSIS — E162 Hypoglycemia, unspecified: Secondary | ICD-10-CM | POA: Diagnosis not present

## 2019-03-08 DIAGNOSIS — E161 Other hypoglycemia: Secondary | ICD-10-CM | POA: Diagnosis not present

## 2019-03-08 DIAGNOSIS — E1165 Type 2 diabetes mellitus with hyperglycemia: Secondary | ICD-10-CM | POA: Diagnosis not present

## 2019-03-08 DIAGNOSIS — R404 Transient alteration of awareness: Secondary | ICD-10-CM | POA: Diagnosis not present

## 2019-03-13 DIAGNOSIS — M109 Gout, unspecified: Secondary | ICD-10-CM | POA: Diagnosis not present

## 2019-03-13 DIAGNOSIS — I10 Essential (primary) hypertension: Secondary | ICD-10-CM | POA: Diagnosis not present

## 2019-03-13 DIAGNOSIS — E1122 Type 2 diabetes mellitus with diabetic chronic kidney disease: Secondary | ICD-10-CM | POA: Diagnosis not present

## 2019-03-13 DIAGNOSIS — M25472 Effusion, left ankle: Secondary | ICD-10-CM | POA: Diagnosis not present

## 2019-03-13 DIAGNOSIS — E782 Mixed hyperlipidemia: Secondary | ICD-10-CM | POA: Diagnosis not present

## 2019-03-13 DIAGNOSIS — M1990 Unspecified osteoarthritis, unspecified site: Secondary | ICD-10-CM | POA: Diagnosis not present

## 2019-03-13 DIAGNOSIS — M1A9XX1 Chronic gout, unspecified, with tophus (tophi): Secondary | ICD-10-CM | POA: Diagnosis not present

## 2019-03-13 DIAGNOSIS — M25572 Pain in left ankle and joints of left foot: Secondary | ICD-10-CM | POA: Diagnosis not present

## 2019-03-13 DIAGNOSIS — M25461 Effusion, right knee: Secondary | ICD-10-CM | POA: Diagnosis not present

## 2019-04-03 DIAGNOSIS — R300 Dysuria: Secondary | ICD-10-CM | POA: Diagnosis not present

## 2019-04-06 DIAGNOSIS — M1711 Unilateral primary osteoarthritis, right knee: Secondary | ICD-10-CM | POA: Diagnosis not present

## 2019-04-06 DIAGNOSIS — M25561 Pain in right knee: Secondary | ICD-10-CM | POA: Insufficient documentation

## 2019-04-11 DIAGNOSIS — E162 Hypoglycemia, unspecified: Secondary | ICD-10-CM | POA: Diagnosis not present

## 2019-04-11 DIAGNOSIS — R569 Unspecified convulsions: Secondary | ICD-10-CM | POA: Diagnosis not present

## 2019-04-11 DIAGNOSIS — R404 Transient alteration of awareness: Secondary | ICD-10-CM | POA: Diagnosis not present

## 2019-04-11 DIAGNOSIS — E1165 Type 2 diabetes mellitus with hyperglycemia: Secondary | ICD-10-CM | POA: Diagnosis not present

## 2019-04-11 DIAGNOSIS — E161 Other hypoglycemia: Secondary | ICD-10-CM | POA: Diagnosis not present

## 2019-05-02 DIAGNOSIS — M25561 Pain in right knee: Secondary | ICD-10-CM | POA: Diagnosis not present

## 2019-05-10 DIAGNOSIS — M25561 Pain in right knee: Secondary | ICD-10-CM | POA: Diagnosis not present

## 2019-05-10 DIAGNOSIS — M1711 Unilateral primary osteoarthritis, right knee: Secondary | ICD-10-CM | POA: Diagnosis not present

## 2019-05-14 ENCOUNTER — Other Ambulatory Visit: Payer: Self-pay | Admitting: *Deleted

## 2019-05-14 DIAGNOSIS — E782 Mixed hyperlipidemia: Secondary | ICD-10-CM | POA: Diagnosis not present

## 2019-05-14 DIAGNOSIS — I10 Essential (primary) hypertension: Secondary | ICD-10-CM | POA: Diagnosis not present

## 2019-05-14 DIAGNOSIS — E1165 Type 2 diabetes mellitus with hyperglycemia: Secondary | ICD-10-CM | POA: Diagnosis not present

## 2019-05-14 DIAGNOSIS — E1122 Type 2 diabetes mellitus with diabetic chronic kidney disease: Secondary | ICD-10-CM | POA: Diagnosis not present

## 2019-05-14 DIAGNOSIS — E781 Pure hyperglyceridemia: Secondary | ICD-10-CM | POA: Diagnosis not present

## 2019-05-14 DIAGNOSIS — I5042 Chronic combined systolic (congestive) and diastolic (congestive) heart failure: Secondary | ICD-10-CM

## 2019-05-15 ENCOUNTER — Telehealth: Payer: Self-pay | Admitting: Cardiology

## 2019-05-15 NOTE — Telephone Encounter (Signed)

## 2019-05-16 DIAGNOSIS — I5042 Chronic combined systolic (congestive) and diastolic (congestive) heart failure: Secondary | ICD-10-CM | POA: Diagnosis not present

## 2019-05-16 DIAGNOSIS — N183 Chronic kidney disease, stage 3 (moderate): Secondary | ICD-10-CM | POA: Diagnosis not present

## 2019-05-17 ENCOUNTER — Telehealth: Payer: Self-pay | Admitting: *Deleted

## 2019-05-17 NOTE — Telephone Encounter (Signed)
Patient informed. Copy sent to PCP °

## 2019-05-17 NOTE — Telephone Encounter (Signed)
-----   Message from Satira Sark, MD sent at 05/17/2019  8:21 AM EDT ----- Results reviewed.  Renal function has improved following last change in Lasix dosing, creatinine now down to 1.63 and potassium normal.  Continue with current follow-up plan.

## 2019-05-18 ENCOUNTER — Ambulatory Visit: Payer: Medicare Other | Admitting: Cardiology

## 2019-05-18 ENCOUNTER — Encounter: Payer: Self-pay | Admitting: Cardiology

## 2019-05-18 ENCOUNTER — Other Ambulatory Visit: Payer: Self-pay

## 2019-05-18 VITALS — BP 128/68 | HR 71 | Ht 67.0 in | Wt 172.0 lb

## 2019-05-18 DIAGNOSIS — I5042 Chronic combined systolic (congestive) and diastolic (congestive) heart failure: Secondary | ICD-10-CM

## 2019-05-18 DIAGNOSIS — N183 Chronic kidney disease, stage 3 unspecified: Secondary | ICD-10-CM

## 2019-05-18 DIAGNOSIS — I429 Cardiomyopathy, unspecified: Secondary | ICD-10-CM | POA: Diagnosis not present

## 2019-05-18 DIAGNOSIS — Z01810 Encounter for preprocedural cardiovascular examination: Secondary | ICD-10-CM | POA: Diagnosis not present

## 2019-05-18 NOTE — Progress Notes (Signed)
Cardiology Office Note  Date: 05/18/2019   ID: [name], DOB 16-Jan-1946, MRN [id number]  PCP:  Manon Hilding, MD  Cardiologist:  Rozann Lesches, MD Electrophysiologist:  None   Chief Complaint  Patient presents with  . Cardiac follow-up    History of Present Illness: Kayla Bell is a 73 y.o. female last assessed via telehealth encounter in June.  She presents to the office for follow-up.  She is scheduled to undergo right total knee arthroplasty under spinal anesthesia with Dr. Alvan Dame in late September.  She states that she has had a lot of right knee pain, significantly limits her activity at this time.  From a cardiac perspective she reports stable NYHA class II dyspnea, tolerating her present medications which are listed below.  Weight has been stable without orthopnea or PND.  I personally reviewed her ECG today which shows sinus rhythm with PVC.  RCRI perioperative risk calculator indicates class III, 6.6% risk of major adverse cardiac event.  Past Medical History:  Diagnosis Date  . Anemia   . Cardiomyopathy ([place])   . Chronic kidney disease, stage 3 ([place])   . Essential hypertension   . GERD (gastroesophageal reflux disease)   . Gout   . Hyperlipidemia   . Type 2 diabetes mellitus ([place])     Past Surgical History:  Procedure Laterality Date  . BREAST SURGERY Left   . PARTIAL HYSTERECTOMY    . RIGHT ARM SURGERY Right     Current Outpatient Medications  Medication Sig Dispense Refill  . allopurinol (ZYLOPRIM) 100 MG tablet Take 1 tablet by mouth daily.    Marland Kitchen amLODipine (NORVASC) 5 MG tablet Take 1 tablet by mouth daily.    Marland Kitchen aspirin 81 MG tablet Take 81 mg by mouth daily.    . carvedilol (COREG) 25 MG tablet Take 25 mg by mouth 2 (two) times daily with a meal.    . clonazePAM (KLONOPIN) 0.5 MG tablet Take 0.5 mg by mouth every 8 (eight) hours as needed for anxiety.     . fluticasone (FLONASE) 50 MCG/ACT nasal spray Place 1 spray into both  nostrils daily.    . furosemide (LASIX) 20 MG tablet Take 1 tablet (20 mg total) by mouth daily as needed (based on fluid and weight). 30 tablet 1  . insulin glargine (LANTUS) 100 UNIT/ML injection Inject 55 Units into the skin daily.     . lansoprazole (PREVACID) 30 MG capsule Take 30 mg by mouth daily at 12 noon.    . pravastatin (PRAVACHOL) 40 MG tablet Take 40 mg by mouth daily.    . sacubitril-valsartan (ENTRESTO) 49-51 MG Take 1 tablet by mouth 2 (two) times daily. Patient has voucher for 1st 30 days free 28 tablet 0   No current facility-administered medications for this visit.    Allergies:  Penicillins   Social History: The patient  reports that she has never smoked. She has never used smokeless tobacco.   ROS:  Please see the history of present illness. Otherwise, complete review of systems is positive for none.  All other systems are reviewed and negative.   Physical Exam: VS:  BP 128/68   Pulse 71   Ht 5\' 7"  (1.702 m)   Wt 172 lb (78 kg)   SpO2 98%   BMI 26.94 kg/m , BMI Body mass index is 26.94 kg/m.  Wt Readings from Last 3 Encounters:  05/18/19 172 lb (78 kg)  02/12/19 173 lb (78.5 kg)  10/06/18  177 lb 3.2 oz (80.4 kg)    General: Patient appears comfortable at rest. HEENT: Conjunctiva and lids normal, wearing a mask. Neck: Supple, no elevated JVP or carotid bruits, no thyromegaly. Lungs: Clear to auscultation, nonlabored breathing at rest. Cardiac: Regular rate and rhythm, no S3 or significant systolic murmur. Abdomen: Soft, nontender, bowel sounds present. Extremities: No pitting edema, distal pulses 2+. Skin: Warm and dry. Musculoskeletal: No kyphosis. Neuropsychiatric: Alert and oriented x3, affect grossly appropriate.  ECG:  An ECG dated 05/01/2018 was personally reviewed today and demonstrated:  Sinus rhythm with PVCs.  Recent Labwork:  September 2020: BUN 27, creatinine 1.63, potassium 4.5  Other Studies Reviewed Today:  Echocardiogram 09/28/2018:   Study Conclusions  - Left ventricle: The cavity size was normal. Wall thickness was normal. Systolic function was mildly to moderately reduced. The estimated ejection fraction was in the range of 40% to 45%. Diastolic function is indeterminant. Diffuse hypokinesis. - Aortic valve: Mildly calcified annulus. Trileaflet; mildly thickened leaflets. Valve area (VTI): 1.81 cm^2. Valve area (Vmax): 1.73 cm^2. Valve area (Vmean): 1.77 cm^2. - Mitral valve: Mildly calcified annulus. Mildly thickened leaflets . There was mild regurgitation. The MR vena contracta is 0.2 cm. Valve area by continuity equation (using LVOT flow): 1.72 cm^2. - Left atrium: The atrium was mildly to moderately dilated. - Atrial septum: No defect or patent foramen ovale was identified.  Assessment and Plan:  1.  Preoperative cardiac evaluation prior to elective right total knee arthroplasty under spinal anesthesia.  Overall perioperative cardiac risk is intermediate range as described above.  She has been clinically stable on medical therapy with no recent heart failure exacerbation.  She should be able to proceed without further cardiac testing.  2.  Nonischemic cardiomyopathy with LVEF 40 to 45%.  She is tolerating medical therapy which includes Coreg, Norvasc, Entresto, and Lasix.  3.  CKD stage III, follow-up creatinine 1.63.  Lasix dose has been cut back, and we do not have her on Aldactone at this point.  4.  Mitral regurgitation, mild by most recent echocardiogram.  Medication Adjustments/Labs and Tests Ordered: Current medicines are reviewed at length with the patient today.  Concerns regarding medicines are outlined above.   Tests Ordered: Orders Placed This Encounter  Procedures  . EKG 12-Lead    Medication Changes: No orders of the defined types were placed in this encounter.   Disposition:  Follow up 6 months in the [place] office.  Signed, Satira Sark, MD, [facility] 05/18/2019 1:45  PM    [facility] [place], [place], [place] [address] Phone: [phone]; Fax: [phone]

## 2019-05-18 NOTE — Patient Instructions (Signed)

## 2019-05-23 DIAGNOSIS — Z23 Encounter for immunization: Secondary | ICD-10-CM | POA: Diagnosis not present

## 2019-05-23 DIAGNOSIS — M109 Gout, unspecified: Secondary | ICD-10-CM | POA: Diagnosis not present

## 2019-05-23 DIAGNOSIS — D509 Iron deficiency anemia, unspecified: Secondary | ICD-10-CM | POA: Diagnosis not present

## 2019-05-23 DIAGNOSIS — I5022 Chronic systolic (congestive) heart failure: Secondary | ICD-10-CM | POA: Diagnosis not present

## 2019-05-23 DIAGNOSIS — E1122 Type 2 diabetes mellitus with diabetic chronic kidney disease: Secondary | ICD-10-CM | POA: Diagnosis not present

## 2019-05-23 DIAGNOSIS — I10 Essential (primary) hypertension: Secondary | ICD-10-CM | POA: Diagnosis not present

## 2019-05-30 NOTE — Patient Instructions (Addendum)
DUE TO COVID-19 ONLY ONE VISITOR IS ALLOWED TO COME WITH YOU AND STAY IN THE WAITING ROOM ONLY DURING PRE OP AND PROCEDURE DAY OF SURGERY.  THE 1 VISITOR MAY VISIT WITH YOU AFTER SURGERY IN YOUR PRIVATE ROOM DURING VISITING HOURS ONLY!     YOU NEED TO HAVE A COVID 19 TEST ON_Monday 9/21_____ @__12 :00pm_____, THIS TEST MUST BE DONE BEFORE SURGERY, COME  Kayla Bell, Kayla Bell , [id number].  ([place])  ONCE YOUR COVID TEST IS COMPLETED, PLEASE BEGIN THE QUARANTINE INSTRUCTIONS AS OUTLINED IN YOUR HANDOUT.                Kayla Bell    Your procedure is scheduled on: Thursday 06/07/19   Report to [facility] Main  Entrance   Report to admitting at  7:30 AM     Call this number if you have problems the morning of surgery [place], NO [place].  Do not eat food After Midnight.   YOU MAY HAVE CLEAR LIQUIDS FROM MIDNIGHT UNTIL 6:30AM  . At 6:30 AM Please finish the prescribed Pre-Surgery Gatorade drink.   Nothing by mouth after you finish the Gatorade drink !     Take these medicines the morning of surgery with A SIP OF WATER:  Coreg, Amlodipine, Allopurinol, Prevacid, and Klonopin if you need it    DO NOT TAKE ANY DIABETIC MEDICATIONS DAY OF YOUR SURGERY      How to Manage Your Diabetes Before and After Surgery  Why is it important to control my blood sugar before and after surgery? . Improving blood sugar levels before and after surgery helps healing and can limit problems. . A way of improving blood sugar control is eating a healthy diet by: o  Eating less sugar and carbohydrates o  Increasing activity/exercise o  Talking with your doctor about reaching your blood sugar goals . High blood sugars (greater than 180 mg/dL) can raise your risk of infections and slow your recovery, so you will need to focus on controlling your diabetes during the weeks  before surgery. . Make sure that the doctor who takes care of your diabetes knows about your planned surgery including the date and location.  How do I manage my blood sugar before surgery? . Check your blood sugar at least 4 times a day, starting 2 days before surgery, to make sure that the level is not too high or low. o Check your blood sugar the morning of your surgery when you wake up and every 2 hours until you get to the Short Stay unit. . If your blood sugar is less than 70 mg/dL, you will need to treat for low blood sugar: o Do not take insulin. o Treat a low blood sugar (less than 70 mg/dL) with  cup of clear juice (cranberry or apple), 4 glucose tablets, OR glucose gel. o Recheck blood sugar in 15 minutes after treatment (to make sure it is greater than 70 mg/dL). If your blood sugar is not greater than 70 mg/dL on recheck, call [phone] for further instructions. . Report your blood sugar to the short stay nurse when you get to Short Stay.  . If you are admitted to the hospital after surgery: o Your blood sugar will be checked by the staff and you will probably be given insulin after surgery (instead of oral diabetes medicines) to make  sure you have good blood sugar levels. o The goal for blood sugar control after surgery is 80-180 mg/dL.   WHAT DO I DO ABOUT MY DIABETES MEDICATION?  Marland Kitchen Do not take oral diabetes medicines (pills) the morning of surgery.  . THE NIGHT BEFORE SURGERY, take 20    units of Glargine [place]      insulin.       . THE MORNING OF SURGERY, take 0   units of   insulin.                             You may not have any metal on your body including hair pins and              piercings              Do not wear jewelry, make-up, lotions, powders or perfumes, deodorant             Do not wear nail polish.  Do not shave  48 hours prior to surgery.     Do not bring valuables to the hospital. [place].  Contacts, dentures or bridgework may not be worn into surgery.     Special Instructions: N/A              Please read over the following fact sheets you were given: _____________________________________________________________________             [facility] - Preparing for Surgery  Before surgery, you can play an important role.   Because skin is not sterile, your skin needs to be as free of germs as possible.   You can reduce the number of germs on your skin by washing with CHG (chlorahexidine gluconate) soap before surgery.   CHG is an antiseptic cleaner which kills germs and bonds with the skin to continue killing germs even after washing. Please DO NOT use if you have an allergy to CHG or antibacterial soaps.   If your skin becomes reddened/irritated stop using the CHG and inform your nurse when you arrive at Short Stay. Do not shave (including legs and underarms) for at least 48 hours prior to the first CHG shower Please follow these instructions carefully:   1.  Shower with CHG Soap the night before surgery and the  morning of Surgery.  2.  If you choose to wash your hair, wash your hair first as usual with your  normal  shampoo.  3.  After you shampoo, rinse your hair and body thoroughly to remove the  shampoo.                                        4.  Use CHG as you would any other liquid soap.  You can apply chg directly  to the skin and wash                       Gently with a scrungie or clean washcloth.  5.  Apply the CHG Soap to your body ONLY FROM THE NECK DOWN.   Do not use on face/ open  Wound or open sores. Avoid contact with eyes, ears mouth and genitals (private parts).                       Wash face,  Genitals (private parts) with your normal soap.             6.  Wash thoroughly, paying special attention to the area where your surgery  will be performed.  7.  Thoroughly rinse your body with warm water from the neck  down.  8.  DO NOT shower/wash with your normal soap after using and rinsing off  the CHG Soap.             9.  Pat yourself dry with a clean towel.            10.  Wear clean pajamas.            11.  Place clean sheets on your bed the night of your first shower and do not  sleep with pets. Day of Surgery : Do not apply any lotions/deodorants the morning of surgery.  Please wear clean clothes to the hospital/surgery center.  FAILURE TO FOLLOW THESE INSTRUCTIONS MAY RESULT IN THE CANCELLATION OF YOUR SURGERY PATIENT SIGNATURE_________________________________  NURSE SIGNATURE__________________________________  ________________________________________________________________________   Adam Phenix  An incentive spirometer is a tool that can help keep your lungs clear and active. This tool measures how well you are filling your lungs with each breath. Taking long deep breaths may help reverse or decrease the chance of developing breathing (pulmonary) problems (especially infection) following:  A long period of time when you are unable to move or be active. BEFORE THE PROCEDURE   If the spirometer includes an indicator to show your best effort, your nurse or respiratory therapist will set it to a desired goal.  If possible, sit up straight or lean slightly forward. Try not to slouch.  Hold the incentive spirometer in an upright position. INSTRUCTIONS FOR USE  1. Sit on the edge of your bed if possible, or sit up as far as you can in bed or on a chair. 2. Hold the incentive spirometer in an upright position. 3. Breathe out normally. 4. Place the mouthpiece in your mouth and seal your lips tightly around it. 5. Breathe in slowly and as deeply as possible, raising the piston or the ball toward the top of the column. 6. Hold your breath for 3-5 seconds or for as long as possible. Allow the piston or ball to fall to the bottom of the column. 7. Remove the mouthpiece from your mouth  and breathe out normally. 8. Rest for a few seconds and repeat Steps 1 through 7 at least 10 times every 1-2 hours when you are awake. Take your time and take a few normal breaths between deep breaths. 9. The spirometer may include an indicator to show your best effort. Use the indicator as a goal to work toward during each repetition. 10. After each set of 10 deep breaths, practice coughing to be sure your lungs are clear. If you have an incision (the cut made at the time of surgery), support your incision when coughing by placing a pillow or rolled up towels firmly against it. Once you are able to get out of bed, walk around indoors and cough well. You may stop using the incentive spirometer when instructed by your caregiver.  RISKS AND COMPLICATIONS  Take your time so you do not get dizzy or  light-headed.  If you are in pain, you may need to take or ask for pain medication before doing incentive spirometry. It is harder to take a deep breath if you are having pain. AFTER USE  Rest and breathe slowly and easily.  It can be helpful to keep track of a log of your progress. Your caregiver can provide you with a simple table to help with this. If you are using the spirometer at home, follow these instructions: [place] IF:   You are having difficultly using the spirometer.  You have trouble using the spirometer as often as instructed.  Your pain medication is not giving enough relief while using the spirometer.  You develop fever of 100.5 F (38.1 C) or higher. SEEK IMMEDIATE MEDICAL CARE IF:   You cough up bloody sputum that had not been present before.  You develop fever of 102 F (38.9 C) or greater.  You develop worsening pain at or near the incision site. MAKE SURE YOU:   Understand these instructions.  Will watch your condition.  Will get help right away if you are not doing well or get worse. Document Released: 01/10/2007 Document Revised: 11/22/2011 Document  Reviewed: 03/13/2007 ExitCare Patient Information 2014 ExitCare, [place].   ________________________________________________________________________  WHAT IS A BLOOD TRANSFUSION? Blood Transfusion Information  A transfusion is the replacement of blood or some of its parts. Blood is made up of multiple cells which provide different functions.  Red blood cells carry oxygen and are used for blood loss replacement.  White blood cells fight against infection.  Platelets control bleeding.  Plasma helps clot blood.  Other blood products are available for specialized needs, such as hemophilia or other clotting disorders. BEFORE THE TRANSFUSION  Who gives blood for transfusions?   Healthy volunteers who are fully evaluated to make sure their blood is safe. This is blood bank blood. Transfusion therapy is the safest it has ever been in the practice of medicine. Before blood is taken from a donor, a complete history is taken to make sure that person has no history of diseases nor engages in risky social behavior (examples are intravenous drug use or sexual activity with multiple partners). The donor's travel history is screened to minimize risk of transmitting infections, such as malaria. The donated blood is tested for signs of infectious diseases, such as HIV and hepatitis. The blood is then tested to be sure it is compatible with you in order to minimize the chance of a transfusion reaction. If you or a relative donates blood, this is often done in anticipation of surgery and is not appropriate for emergency situations. It takes many days to process the donated blood. RISKS AND COMPLICATIONS Although transfusion therapy is very safe and saves many lives, the main dangers of transfusion include:   Getting an infectious disease.  Developing a transfusion reaction. This is an allergic reaction to something in the blood you were given. Every precaution is taken to prevent this. The decision to have a  blood transfusion has been considered carefully by your caregiver before blood is given. Blood is not given unless the benefits outweigh the risks. AFTER THE TRANSFUSION  Right after receiving a blood transfusion, you will usually feel much better and more energetic. This is especially true if your red blood cells have gotten low (anemic). The transfusion raises the level of the red blood cells which carry oxygen, and this usually causes an energy increase.  The nurse administering the transfusion will monitor you  carefully for complications. HOME CARE INSTRUCTIONS  No special instructions are needed after a transfusion. You may find your energy is better. Speak with your caregiver about any limitations on activity for underlying diseases you may have. SEEK MEDICAL CARE IF:   Your condition is not improving after your transfusion.  You develop redness or irritation at the intravenous (IV) site. SEEK IMMEDIATE MEDICAL CARE IF:  Any of the following symptoms occur over the next 12 hours:  Shaking chills.  You have a temperature by mouth above 102 F (38.9 C), not controlled by medicine.  Chest, back, or muscle pain.  People around you feel you are not acting correctly or are confused.  Shortness of breath or difficulty breathing.  Dizziness and fainting.  You get a rash or develop hives.  You have a decrease in urine output.  Your urine turns a dark color or changes to pink, red, or brown. Any of the following symptoms occur over the next 10 days:  You have a temperature by mouth above 102 F (38.9 C), not controlled by medicine.  Shortness of breath.  Weakness after normal activity.  The white part of the eye turns yellow (jaundice).  You have a decrease in the amount of urine or are urinating less often.  Your urine turns a dark color or changes to pink, red, or brown. Document Released: 08/27/2000 Document Revised: 11/22/2011 Document Reviewed: 04/15/2008 [facility]  Patient Information 2014 [place], [place].  _______________________________________________________________________

## 2019-06-01 ENCOUNTER — Encounter (HOSPITAL_COMMUNITY): Payer: Self-pay

## 2019-06-01 ENCOUNTER — Other Ambulatory Visit: Payer: Self-pay

## 2019-06-01 ENCOUNTER — Encounter (HOSPITAL_COMMUNITY)
Admission: RE | Admit: 2019-06-01 | Discharge: 2019-06-01 | Disposition: A | Discharge status: Home / Self Care | Payer: Medicare Other | Source: Ambulatory Visit | Attending: Orthopedic Surgery | Admitting: Orthopedic Surgery

## 2019-06-01 DIAGNOSIS — Z01812 Encounter for preprocedural laboratory examination: Secondary | ICD-10-CM | POA: Diagnosis not present

## 2019-06-01 DIAGNOSIS — M25561 Pain in right knee: Secondary | ICD-10-CM | POA: Diagnosis not present

## 2019-06-01 DIAGNOSIS — M1711 Unilateral primary osteoarthritis, right knee: Secondary | ICD-10-CM | POA: Diagnosis not present

## 2019-06-01 HISTORY — DX: Depression, unspecified: F32.A

## 2019-06-01 HISTORY — DX: Unspecified osteoarthritis, unspecified site: M19.90

## 2019-06-01 HISTORY — DX: Anxiety disorder, unspecified: F41.9

## 2019-06-01 LAB — BASIC METABOLIC PANEL
Anion gap: 8 (ref 5–15)
BUN: 37 mg/dL — ABNORMAL HIGH (ref 8–23)
CO2: 27 mmol/L (ref 22–32)
Calcium: 9.3 mg/dL (ref 8.9–10.3)
Chloride: 109 mmol/L (ref 98–111)
Creatinine, Ser: 1.84 mg/dL — ABNORMAL HIGH (ref 0.44–1.00)
GFR calc Af Amer: 31 mL/min — ABNORMAL LOW (ref >60)
GFR calc non Af Amer: 27 mL/min — ABNORMAL LOW (ref >60)
Glucose, Bld: 127 mg/dL — ABNORMAL HIGH (ref 70–99)
Potassium: 5.3 mmol/L — ABNORMAL HIGH (ref 3.5–5.1)
Sodium: 144 mmol/L (ref 135–145)

## 2019-06-01 LAB — CBC
HCT: 39.4 % (ref 36.0–46.0)
Hemoglobin: 11.9 g/dL — ABNORMAL LOW (ref 12.0–15.0)
MCH: 26.9 pg (ref 26.0–34.0)
MCHC: 30.2 g/dL (ref 30.0–36.0)
MCV: 89.1 fL (ref 80.0–100.0)
Platelets: 273 10*3/uL (ref 150–400)
RBC: 4.42 MIL/uL (ref 3.87–5.11)
RDW: 14.8 % (ref 11.5–15.5)
WBC: 6.7 10*3/uL (ref 4.0–10.5)
nRBC: 0 % (ref 0.0–0.2)

## 2019-06-01 LAB — SURGICAL PCR SCREEN
MRSA, PCR: NEGATIVE
Staphylococcus aureus: NEGATIVE

## 2019-06-01 LAB — GLUCOSE, CAPILLARY: Glucose-Capillary: 164 mg/dL — ABNORMAL HIGH (ref 70–99)

## 2019-06-01 NOTE — H&P (Signed)
TOTAL KNEE ADMISSION H&P  Patient is being admitted for right total knee arthroplasty.  Subjective:  Chief Complaint:    Right knee primary OA / pain  HPI: [employer], 73 y.o. female, has a history of pain and functional disability in the right knee due to arthritis and has failed non-surgical conservative treatments for greater than 12 weeks to includeNSAID's and/or analgesics, corticosteriod injections and activity modification.  Onset of symptoms was gradual, starting 2-3 years ago with gradually worsening course since that time. The patient noted no past surgery on the right knee(s).  Patient currently rates pain in the right knee(s) at 10 out of 10 with activity. Patient has night pain, worsening of pain with activity and weight bearing, pain that interferes with activities of daily living, pain with passive range of motion, crepitus and joint swelling.  Patient has evidence of periarticular osteophytes and joint space narrowing by imaging studies.  There is no active infection.  Risks, benefits and expectations were discussed with the patient.  Risks including but not limited to the risk of anesthesia, blood clots, nerve damage, blood vessel damage, failure of the prosthesis, infection and up to and including death.  Patient understand the risks, benefits and expectations and wishes to proceed with surgery.   PCP: Manon Hilding, MD  D/C Plans:       Home   Post-op Meds:       No Rx given  Tranexamic Acid:      To be given - IV   Decadron:      Is to be given  FYI:      ASA  Norco  DME:    Rx for equipment sent -  RW & 3-n-1  PT:   [place]: [place]    Patient Active Problem List   Diagnosis Date Noted  . Chronic diastolic heart failure ([place]) 02/24/2017  . Cardiomyopathy [facility])    Past Medical History:  Diagnosis Date  . Anemia   . Anxiety   . Arthritis    hands,  . Cardiomyopathy ([place])   . Chronic kidney disease, stage 3 ([place])   . Depression    . Essential hypertension   . GERD (gastroesophageal reflux disease)   . Gout   . Hyperlipidemia   . Type 2 diabetes mellitus ([place])     Past Surgical History:  Procedure Laterality Date  . BREAST SURGERY Left   . PARTIAL HYSTERECTOMY    . RIGHT ARM SURGERY Right     No current facility-administered medications for this encounter.    Current Outpatient Medications  Medication Sig Dispense Refill Last Dose  . acetaminophen (TYLENOL) 500 MG tablet Take 1,000 mg by mouth every 8 (eight) hours as needed for moderate pain.     Marland Kitchen allopurinol (ZYLOPRIM) 100 MG tablet Take 100 mg by mouth every evening.      Marland Kitchen amLODipine (NORVASC) 5 MG tablet Take 5 mg by mouth daily.      Marland Kitchen aspirin 81 MG tablet Take 81 mg by mouth daily.     . Carboxymethylcellul-Glycerin (LUBRICATING EYE DROPS OP) Place 1 drop into both eyes daily as needed (dry eyes).     . carvedilol (COREG) 25 MG tablet Take 25 mg by mouth 2 (two) times daily with a meal.     . clonazePAM (KLONOPIN) 0.5 MG tablet Take 0.5 mg by mouth every 8 (eight) hours as needed for anxiety.      . fluticasone (FLONASE) 50  MCG/ACT nasal spray Place 1 spray into both nostrils daily.     . furosemide (LASIX) 20 MG tablet Take 1 tablet (20 mg total) by mouth daily as needed (based on fluid and weight). 30 tablet 1   . insulin glargine (LANTUS) 100 UNIT/ML injection Inject 40 Units into the skin daily at 6 PM.      . lansoprazole (PREVACID) 15 MG capsule Take 15 mg by mouth daily at 12 noon.     . pravastatin (PRAVACHOL) 40 MG tablet Take 40 mg by mouth at bedtime.      . sacubitril-valsartan (ENTRESTO) 49-51 MG Take 1 tablet by mouth 2 (two) times daily. Patient has voucher for 1st 30 days free 28 tablet 0   . traMADol (ULTRAM) 50 MG tablet Take 50 mg by mouth every 6 (six) hours as needed for moderate pain.     . Vitamin D, Cholecalciferol, 50 MCG (2000 UT) CAPS Take 2,000 Units by mouth daily.      Allergies  Allergen Reactions  . Penicillins Hives     Did it involve swelling of the face/tongue/throat, SOB, or low BP? No Did it involve sudden or severe rash/hives, skin peeling, or any reaction on the inside of your mouth or nose? No Did you need to seek medical attention at a hospital or doctor's office? No When did it last happen?10-15 years If all above answers are "NO", may proceed with cephalosporin use.     Social History   Tobacco Use  . Smoking status: Never Smoker  . Smokeless tobacco: Never Used  Substance Use Topics  . Alcohol use: Never    Alcohol/week: 0.0 standard drinks    Frequency: Never    Family History  Problem Relation Age of Onset  . Kidney disease Sister   . Heart disease Sister   . Heart disease Brother   . Heart failure Son   . Heart disease Sister      Review of Systems  Constitutional: Negative.   HENT: Negative.   Eyes: Negative.   Respiratory: Negative.   Cardiovascular: Negative.   Gastrointestinal: Positive for heartburn.  Genitourinary: Negative.   Musculoskeletal: Positive for joint pain.  Skin: Negative.   Neurological: Negative.   Endo/Heme/Allergies: Negative.   Psychiatric/Behavioral: Positive for depression. The patient is nervous/anxious.     Objective:  Physical Exam  Constitutional: She is oriented to person, place, and time. She appears well-developed.  HENT:  Head: Normocephalic.  Eyes: Pupils are equal, round, and reactive to light.  Neck: Neck supple. No JVD present. No tracheal deviation present. No thyromegaly present.  Cardiovascular: Normal rate, regular rhythm and intact distal pulses.  Respiratory: Effort normal and breath sounds normal. No respiratory distress. She has no wheezes.  GI: Soft. There is no abdominal tenderness. There is no guarding.  Musculoskeletal:     Right knee: She exhibits decreased range of motion, swelling and bony tenderness. She exhibits no ecchymosis, no deformity, no laceration and no erythema. Tenderness found.   Lymphadenopathy:    She has no cervical adenopathy.  Neurological: She is alert and oriented to person, place, and time.  Skin: Skin is warm and dry.  Psychiatric: She has a normal mood and affect.    Vital signs in last 24 hours: Temp:  [98.2 F (36.8 C)] 98.2 F (36.8 C) (09/18 1257) Pulse Rate:  [73] 73 (09/18 1257) Resp:  [18] 18 (09/18 1257) BP: (140)/(55) 140/55 (09/18 1257) SpO2:  [100 %] 100 % (09/18 1257) Weight:  [  77.7 kg] 77.7 kg (09/18 1257)  Labs:   Estimated body mass index is 26.81 kg/m as calculated from the following:   Height as of 06/01/19: 5\' 7"  (1.702 m).   Weight as of 06/01/19: 77.7 kg.   Imaging Review Plain radiographs demonstrate severe degenerative joint disease of the right knee.  The bone quality appears to be good for age and reported activity level.      Assessment/Plan:  End stage arthritis, right knee   The patient history, physical examination, clinical judgment of the provider and imaging studies are consistent with end stage degenerative joint disease of the right knee and total knee arthroplasty is deemed medically necessary. The treatment options including medical management, injection therapy arthroscopy and arthroplasty were discussed at length. The risks and benefits of total knee arthroplasty were presented and reviewed. The risks due to aseptic loosening, infection, stiffness, patella tracking problems, thromboembolic complications and other imponderables were discussed. The patient acknowledged the explanation, agreed to proceed with the plan and consent was signed. Patient is being admitted for inpatient treatment for surgery, pain control, PT, OT, prophylactic antibiotics, VTE prophylaxis, progressive ambulation and ADL's and discharge planning. The patient is planning to be discharged home.     Patient's anticipated LOS is less than 2 midnights, meeting these requirements: - Lives within 1 hour of care - Has a competent adult  at home to recover with post-op recover - NO history of  - Chronic pain requiring opiods  - Diabetes  - Coronary Artery Disease  - Heart failure  - Heart attack  - Stroke  - DVT/VTE  - Cardiac arrhythmia  - Respiratory Failure/COPD  - Renal failure  - Anemia  - Advanced Liver disease     [place]. Bard Haupert   PA-C  06/01/2019, 1:40 PM

## 2019-06-01 NOTE — Progress Notes (Signed)
PCP - Dr. Jeanelle Malling Cardiologist - Dr. Johnny Bridge  Chest x-ray - no EKG - 05/18/19 Stress Test -no ECHO - 09/28/18 Cardiac Cath -no. Pt has cardiomyopathy   Sleep Study - no CPAP -   Fasting Blood Sugar - 80-125 Checks Blood Sugar _BID____ times a day  Blood Thinner Instructions:ASa Aspirin Instructions:Stop 5 day before Surgery Last Dose:05/29/19  Anesthesia review:   Patient denies shortness of breath, fever, cough and chest pain at PAT appointment yes  Patient verbalized understanding of instructions that were given to them at the PAT appointment. Patient was also instructed that they will need to review over the PAT instructions again at home before surgery. yes

## 2019-06-02 LAB — ABO/RH: ABO/RH(D): A POS

## 2019-06-04 ENCOUNTER — Other Ambulatory Visit (HOSPITAL_COMMUNITY)
Admission: RE | Admit: 2019-06-04 | Discharge: 2019-06-04 | Disposition: A | Discharge status: Home / Self Care | Payer: Medicare Other | Source: Ambulatory Visit | Attending: Orthopedic Surgery | Admitting: Orthopedic Surgery

## 2019-06-04 DIAGNOSIS — Z20828 Contact with and (suspected) exposure to other viral communicable diseases: Secondary | ICD-10-CM | POA: Diagnosis not present

## 2019-06-04 DIAGNOSIS — Z01812 Encounter for preprocedural laboratory examination: Secondary | ICD-10-CM | POA: Diagnosis not present

## 2019-06-05 NOTE — Progress Notes (Signed)
Anesthesia Chart Review   Case: [id number] Date/Time: 06/07/19 1055   Procedure: TOTAL KNEE ARTHROPLASTY (Right Knee) - 70 mins   Anesthesia type: Spinal   Pre-op diagnosis: Right knee osteoarthritis   Location: [facility] ROOM 10 / [facility]   Surgeon: Paralee Cancel, MD      DISCUSSION:73 y.o. never smoker with h/o CKD Stage III, HTN, DM II, GERD, HLD, nonischemic cardiomyopathy with LVEF 40-45%, right knee OA scheduled for above procedure 06/07/2019 with Dr. Paralee Cancel.   Pt last seen by cardiologist, Dr. Rozann Lesches, 05/18/2019. Per OV note, "Preoperative cardiac evaluation prior to elective right total knee arthroplasty under spinal anesthesia.  Overall perioperative cardiac risk is intermediate range as described above.  She has been clinically stable on medical therapy with no recent heart failure exacerbation.  She should be able to proceed without further cardiac testing."  H/o CKD Stage III with creatinine 1.84 at PAT visit 06/01/2019.  Spoke with PCP, pt baseline creatinine is around 1.8; stable.   Anticipate pt can proceed with planned procedure barring acute status change.   VS: BP (!) 140/55   Pulse 73   Temp 36.8 C (Oral)   Resp 18   Ht 5\' 7"  (1.702 m)   Wt 77.7 kg   SpO2 100%   BMI 26.81 kg/m   PROVIDERS: Sasser, Silvestre Moment, MD is PCP   Rozann Lesches, MD is Cardiologist  LABS: Labs reviewed: Acceptable for surgery. (all labs ordered are listed, but only abnormal results are displayed)  Labs Reviewed  GLUCOSE, CAPILLARY - Abnormal; Notable for the following components:      Result Value   Glucose-Capillary 164 (*)    All other components within normal limits  BASIC METABOLIC PANEL - Abnormal; Notable for the following components:   Potassium 5.3 (*)    Glucose, Bld 127 (*)    BUN 37 (*)    Creatinine, Ser 1.84 (*)    GFR calc non Af Amer 27 (*)    GFR calc Af Amer 31 (*)    All other components within normal limits  CBC - Abnormal; Notable for the following  components:   Hemoglobin 11.9 (*)    All other components within normal limits  SURGICAL PCR SCREEN  TYPE AND SCREEN  ABO/RH     IMAGES:   EKG: 05/18/2019 Rate 75 bpm Sinus rhythm with occasional premature ventricular complexes  CV: Echo 09/28/2018 Study Conclusions  - Left ventricle: The cavity size was normal. Wall thickness was   normal. Systolic function was mildly to moderately reduced. The   estimated ejection fraction was in the range of 40% to 45%.   Diastolic function is indeterminant. Diffuse hypokinesis. - Aortic valve: Mildly calcified annulus. Trileaflet; mildly   thickened leaflets. Valve area (VTI): 1.81 cm^2. Valve area   (Vmax): 1.73 cm^2. Valve area (Vmean): 1.77 cm^2. - Mitral valve: Mildly calcified annulus. Mildly thickened leaflets   . There was mild regurgitation. The MR vena contracta is 0.2 cm.   Valve area by continuity equation (using LVOT flow): 1.72 cm^2. - Left atrium: The atrium was mildly to moderately dilated. - Atrial septum: No defect or patent foramen ovale was identified. Past Medical History:  Diagnosis Date  . Anemia   . Anxiety   . Arthritis    hands,  . Cardiomyopathy ([place])   . Chronic kidney disease, stage 3 ([place])   . Depression   . Essential hypertension   . GERD (gastroesophageal reflux disease)   . Gout   .  Hyperlipidemia   . Type 2 diabetes mellitus ([place])     Past Surgical History:  Procedure Laterality Date  . BREAST SURGERY Left   . CHOLECYSTECTOMY    . EYE SURGERY Bilateral   . PARTIAL HYSTERECTOMY    . RIGHT ARM SURGERY Right     MEDICATIONS: . acetaminophen (TYLENOL) 500 MG tablet  . allopurinol (ZYLOPRIM) 100 MG tablet  . amLODipine (NORVASC) 5 MG tablet  . aspirin 81 MG tablet  . Carboxymethylcellul-Glycerin (LUBRICATING EYE DROPS OP)  . carvedilol (COREG) 25 MG tablet  . clonazePAM (KLONOPIN) 0.5 MG tablet  . fluticasone (FLONASE) 50 MCG/ACT nasal spray  . furosemide (LASIX) 20 MG tablet  . insulin  glargine (LANTUS) 100 UNIT/ML injection  . lansoprazole (PREVACID) 15 MG capsule  . pravastatin (PRAVACHOL) 40 MG tablet  . sacubitril-valsartan (ENTRESTO) 49-51 MG  . traMADol (ULTRAM) 50 MG tablet  . Vitamin D, Cholecalciferol, 50 MCG (2000 UT) CAPS   No current facility-administered medications for this encounter.      Maia Plan [facility] Pre-Surgical Testing [phone] 06/06/19  12:21 PM

## 2019-06-06 LAB — NOVEL CORONAVIRUS, NAA (HOSP ORDER, SEND-OUT TO REF LAB; TAT 18-24 HRS): SARS-CoV-2, NAA: NOT DETECTED

## 2019-06-06 NOTE — Anesthesia Preprocedure Evaluation (Addendum)
Anesthesia Evaluation  Patient identified by MRN, date of birth, ID band Patient awake    Reviewed: Allergy & Precautions, NPO status , Patient's Chart, lab work & pertinent test results  Airway Mallampati: II  TM Distance: >3 FB Neck ROM: Full    Dental no notable dental hx. (+) Teeth Intact   Pulmonary neg pulmonary ROS,    Pulmonary exam normal breath sounds clear to auscultation       Cardiovascular hypertension, Pt. on medications and Pt. on home beta blockers Normal cardiovascular exam Rhythm:Regular Rate:Normal  09/28/18 Echo  Ischemic cardiomyopathy  Left ventricle: The cavity size was normal. Wall thickness was   normal. Systolic function was mildly to moderately reduced. The   estimated ejection fraction was in the range of 40% to 45%.   Diastolic function is indeterminant. Diffuse hypokinesis   Neuro/Psych PSYCHIATRIC DISORDERS Depression negative neurological ROS     GI/Hepatic GERD  ,  Endo/Other  diabetes, Type 1  Renal/GU Renal InsufficiencyRenal diseaseK+ 4.5 Cr 2.19     Musculoskeletal  (+) Arthritis ,   Abdominal   Peds  Hematology Hgb 11.9 plt 273   Anesthesia Other Findings   Reproductive/Obstetrics                           Anesthesia Physical Anesthesia Plan  ASA: III  Anesthesia Plan: Spinal and Regional   Post-op Pain Management:  Regional for Post-op pain   Induction:   PONV Risk Score and Plan: Treatment may vary due to age or medical condition and Ondansetron  Airway Management Planned: Nasal Cannula and Natural Airway  Additional Equipment:   Intra-op Plan:   Post-operative Plan:   Informed Consent: I have reviewed the patients History and Physical, chart, labs and discussed the procedure including the risks, benefits and alternatives for the proposed anesthesia with the patient or authorized representative who has indicated his/her understanding  and acceptance.     Dental advisory given  Plan Discussed with: CRNA  Anesthesia Plan Comments: (See PAT note 06/01/2019, Konrad Felix, PA-C  Spinal w R Adfductor canal)       Anesthesia Quick Evaluation

## 2019-06-07 ENCOUNTER — Other Ambulatory Visit: Payer: Self-pay

## 2019-06-07 ENCOUNTER — Ambulatory Visit (HOSPITAL_COMMUNITY): Payer: Medicare Other | Admitting: Physician Assistant

## 2019-06-07 ENCOUNTER — Encounter (HOSPITAL_COMMUNITY): Payer: Self-pay | Admitting: Emergency Medicine

## 2019-06-07 ENCOUNTER — Ambulatory Visit (HOSPITAL_COMMUNITY): Payer: Medicare Other | Admitting: Anesthesiology

## 2019-06-07 ENCOUNTER — Encounter (HOSPITAL_COMMUNITY)
Admission: RE | Disposition: A | Discharge status: Home / Self Care | Payer: Self-pay | Source: Home / Self Care | Attending: Orthopedic Surgery

## 2019-06-07 ENCOUNTER — Observation Stay (HOSPITAL_COMMUNITY)
Admission: RE | Admit: 2019-06-07 | Discharge: 2019-06-09 | Disposition: A | Discharge status: Home / Self Care | Payer: Medicare Other | Attending: Orthopedic Surgery | Admitting: Orthopedic Surgery

## 2019-06-07 DIAGNOSIS — M109 Gout, unspecified: Secondary | ICD-10-CM | POA: Insufficient documentation

## 2019-06-07 DIAGNOSIS — Z7982 Long term (current) use of aspirin: Secondary | ICD-10-CM | POA: Insufficient documentation

## 2019-06-07 DIAGNOSIS — M1711 Unilateral primary osteoarthritis, right knee: Secondary | ICD-10-CM | POA: Diagnosis not present

## 2019-06-07 DIAGNOSIS — Z79899 Other long term (current) drug therapy: Secondary | ICD-10-CM | POA: Insufficient documentation

## 2019-06-07 DIAGNOSIS — I10 Essential (primary) hypertension: Secondary | ICD-10-CM | POA: Diagnosis not present

## 2019-06-07 DIAGNOSIS — K219 Gastro-esophageal reflux disease without esophagitis: Secondary | ICD-10-CM | POA: Insufficient documentation

## 2019-06-07 DIAGNOSIS — I130 Hypertensive heart and chronic kidney disease with heart failure and stage 1 through stage 4 chronic kidney disease, or unspecified chronic kidney disease: Secondary | ICD-10-CM | POA: Insufficient documentation

## 2019-06-07 DIAGNOSIS — I5032 Chronic diastolic (congestive) heart failure: Secondary | ICD-10-CM | POA: Diagnosis not present

## 2019-06-07 DIAGNOSIS — Z794 Long term (current) use of insulin: Secondary | ICD-10-CM | POA: Diagnosis not present

## 2019-06-07 DIAGNOSIS — G8918 Other acute postprocedural pain: Secondary | ICD-10-CM | POA: Diagnosis not present

## 2019-06-07 DIAGNOSIS — F419 Anxiety disorder, unspecified: Secondary | ICD-10-CM | POA: Diagnosis not present

## 2019-06-07 DIAGNOSIS — Z96651 Presence of right artificial knee joint: Secondary | ICD-10-CM

## 2019-06-07 DIAGNOSIS — E785 Hyperlipidemia, unspecified: Secondary | ICD-10-CM | POA: Diagnosis not present

## 2019-06-07 DIAGNOSIS — I255 Ischemic cardiomyopathy: Secondary | ICD-10-CM | POA: Insufficient documentation

## 2019-06-07 DIAGNOSIS — N183 Chronic kidney disease, stage 3 (moderate): Secondary | ICD-10-CM | POA: Diagnosis not present

## 2019-06-07 DIAGNOSIS — E109 Type 1 diabetes mellitus without complications: Secondary | ICD-10-CM | POA: Diagnosis not present

## 2019-06-07 HISTORY — PX: TOTAL KNEE ARTHROPLASTY: SHX125

## 2019-06-07 LAB — GLUCOSE, CAPILLARY
Glucose-Capillary: 111 mg/dL — ABNORMAL HIGH (ref 70–99)
Glucose-Capillary: 79 mg/dL (ref 70–99)
Glucose-Capillary: 240 mg/dL — ABNORMAL HIGH (ref 70–99)

## 2019-06-07 LAB — BASIC METABOLIC PANEL
Anion gap: 9 (ref 5–15)
BUN: 38 mg/dL — ABNORMAL HIGH (ref 8–23)
CO2: 23 mmol/L (ref 22–32)
Calcium: 9.1 mg/dL (ref 8.9–10.3)
Chloride: 106 mmol/L (ref 98–111)
Creatinine, Ser: 2.14 mg/dL — ABNORMAL HIGH (ref 0.44–1.00)
GFR calc Af Amer: 26 mL/min — ABNORMAL LOW (ref >60)
GFR calc non Af Amer: 22 mL/min — ABNORMAL LOW (ref >60)
Glucose, Bld: 106 mg/dL — ABNORMAL HIGH (ref 70–99)
Potassium: 4.5 mmol/L (ref 3.5–5.1)
Sodium: 138 mmol/L (ref 135–145)

## 2019-06-07 LAB — TYPE AND SCREEN
ABO/RH(D): A POS
Antibody Screen: NEGATIVE

## 2019-06-07 SURGERY — ARTHROPLASTY, KNEE, TOTAL
Anesthesia: Regional | Site: Knee | Laterality: Right

## 2019-06-07 MED ORDER — ROPIVACAINE HCL 7.5 MG/ML IJ SOLN
INTRAMUSCULAR | Status: DC | PRN
  Administered 2019-06-07: 20 mL via PERINEURAL

## 2019-06-07 MED ORDER — MENTHOL 3 MG MT LOZG: 1.0000 | LOZENGE | OROMUCOSAL | Status: DC | PRN

## 2019-06-07 MED ORDER — ASPIRIN 81 MG PO CHEW
81.0000 mg | CHEWABLE_TABLET | Freq: Two times a day (BID) | ORAL | Status: DC
  Administered 2019-06-07 – 2019-06-09 (×4): 81 mg via ORAL
  Filled 2019-06-07 (×4): qty 1

## 2019-06-07 MED ORDER — SODIUM CHLORIDE (PF) 0.9 % IJ SOLN
INTRAMUSCULAR | Status: AC
  Filled 2019-06-07: qty 50

## 2019-06-07 MED ORDER — VANCOMYCIN HCL 1000 MG IV SOLR
INTRAVENOUS | Status: AC
  Filled 2019-06-07: qty 1000

## 2019-06-07 MED ORDER — DIPHENHYDRAMINE HCL 12.5 MG/5ML PO ELIX: 12.5000 mg | ORAL_SOLUTION | ORAL | Status: DC | PRN

## 2019-06-07 MED ORDER — HYDROCODONE-ACETAMINOPHEN 5-325 MG PO TABS
1.0000 | ORAL_TABLET | ORAL | Status: DC | PRN
  Administered 2019-06-07 – 2019-06-09 (×3): 2 via ORAL
  Filled 2019-06-07 (×3): qty 2

## 2019-06-07 MED ORDER — LACTATED RINGERS IV SOLN
INTRAVENOUS | Status: DC | PRN
  Administered 2019-06-07 (×2): via INTRAVENOUS

## 2019-06-07 MED ORDER — SACUBITRIL-VALSARTAN 49-51 MG PO TABS
1.0000 | ORAL_TABLET | Freq: Two times a day (BID) | ORAL | Status: DC
  Administered 2019-06-08 – 2019-06-09 (×3): 1 via ORAL
  Filled 2019-06-07 (×4): qty 1

## 2019-06-07 MED ORDER — TRANEXAMIC ACID-NACL 1000-0.7 MG/100ML-% IV SOLN
1000.0000 mg | INTRAVENOUS | Status: AC
  Administered 2019-06-07: 13:00:00 1000 mg via INTRAVENOUS
  Filled 2019-06-07: qty 100

## 2019-06-07 MED ORDER — ACETAMINOPHEN 10 MG/ML IV SOLN: 1000.0000 mg | Freq: Once | INTRAVENOUS | Status: DC | PRN

## 2019-06-07 MED ORDER — CEFAZOLIN SODIUM-DEXTROSE 2-4 GM/100ML-% IV SOLN
2.0000 g | Freq: Once | INTRAVENOUS | Status: DC
  Filled 2019-06-07: qty 100

## 2019-06-07 MED ORDER — DEXAMETHASONE SODIUM PHOSPHATE 10 MG/ML IJ SOLN
10.0000 mg | Freq: Once | INTRAMUSCULAR | Status: AC
  Administered 2019-06-07: 13:00:00 5 mg via INTRAVENOUS

## 2019-06-07 MED ORDER — PROPOFOL 10 MG/ML IV BOLUS
INTRAVENOUS | Status: AC
  Filled 2019-06-07: qty 80

## 2019-06-07 MED ORDER — SODIUM CHLORIDE 0.9 % IR SOLN
Status: DC | PRN
  Administered 2019-06-07: 1000 mL

## 2019-06-07 MED ORDER — FENTANYL CITRATE (PF) 100 MCG/2ML IJ SOLN
INTRAMUSCULAR | Status: DC | PRN
  Administered 2019-06-07: 50 ug via INTRAVENOUS

## 2019-06-07 MED ORDER — AMLODIPINE BESYLATE 5 MG PO TABS
5.0000 mg | ORAL_TABLET | Freq: Every day | ORAL | Status: DC
  Administered 2019-06-08 – 2019-06-09 (×2): 5 mg via ORAL
  Filled 2019-06-07 (×2): qty 1

## 2019-06-07 MED ORDER — METHOCARBAMOL 500 MG IVPB - SIMPLE MED
500.0000 mg | Freq: Four times a day (QID) | INTRAVENOUS | Status: DC | PRN
  Filled 2019-06-07: qty 50

## 2019-06-07 MED ORDER — TRANEXAMIC ACID-NACL 1000-0.7 MG/100ML-% IV SOLN
1000.0000 mg | Freq: Once | INTRAVENOUS | Status: AC
  Administered 2019-06-07: 1000 mg via INTRAVENOUS
  Filled 2019-06-07: qty 100

## 2019-06-07 MED ORDER — KETOROLAC TROMETHAMINE 30 MG/ML IJ SOLN
INTRAMUSCULAR | Status: AC
  Filled 2019-06-07: qty 1

## 2019-06-07 MED ORDER — CARVEDILOL 25 MG PO TABS
25.0000 mg | ORAL_TABLET | Freq: Two times a day (BID) | ORAL | Status: DC
  Administered 2019-06-08 – 2019-06-09 (×3): 25 mg via ORAL
  Filled 2019-06-07 (×3): qty 1

## 2019-06-07 MED ORDER — METOCLOPRAMIDE HCL 5 MG PO TABS: 5.0000 mg | ORAL_TABLET | Freq: Three times a day (TID) | ORAL | Status: DC | PRN

## 2019-06-07 MED ORDER — ONDANSETRON HCL 4 MG/2ML IJ SOLN: 4.0000 mg | Freq: Four times a day (QID) | INTRAMUSCULAR | Status: DC | PRN

## 2019-06-07 MED ORDER — FENTANYL CITRATE (PF) 100 MCG/2ML IJ SOLN: 25.0000 ug | INTRAMUSCULAR | Status: DC | PRN

## 2019-06-07 MED ORDER — SODIUM CHLORIDE 0.9 % IV SOLN
INTRAVENOUS | Status: DC
  Administered 2019-06-07: 22:00:00 via INTRAVENOUS

## 2019-06-07 MED ORDER — BUPIVACAINE HCL (PF) 0.25 % IJ SOLN
INTRAMUSCULAR | Status: AC
  Filled 2019-06-07: qty 30

## 2019-06-07 MED ORDER — SODIUM CHLORIDE (PF) 0.9 % IJ SOLN
INTRAMUSCULAR | Status: DC | PRN
  Administered 2019-06-07: 30 mL

## 2019-06-07 MED ORDER — 0.9 % SODIUM CHLORIDE (POUR BTL) OPTIME
TOPICAL | Status: DC | PRN
  Administered 2019-06-07: 1000 mL

## 2019-06-07 MED ORDER — ACETAMINOPHEN 325 MG PO TABS: 325.0000 mg | ORAL_TABLET | Freq: Four times a day (QID) | ORAL | Status: DC | PRN

## 2019-06-07 MED ORDER — METOCLOPRAMIDE HCL 5 MG/ML IJ SOLN: 5.0000 mg | Freq: Three times a day (TID) | INTRAMUSCULAR | Status: DC | PRN

## 2019-06-07 MED ORDER — STERILE WATER FOR IRRIGATION IR SOLN
Status: DC | PRN
  Administered 2019-06-07: 2000 mL

## 2019-06-07 MED ORDER — METHOCARBAMOL 500 MG PO TABS
500.0000 mg | ORAL_TABLET | Freq: Four times a day (QID) | ORAL | Status: DC | PRN
  Administered 2019-06-07 – 2019-06-09 (×4): 500 mg via ORAL
  Filled 2019-06-07 (×4): qty 1

## 2019-06-07 MED ORDER — VANCOMYCIN HCL IN DEXTROSE 1-5 GM/200ML-% IV SOLN
1000.0000 mg | INTRAVENOUS | Status: DC
  Filled 2019-06-07: qty 200

## 2019-06-07 MED ORDER — CLONAZEPAM 0.5 MG PO TABS
0.5000 mg | ORAL_TABLET | Freq: Three times a day (TID) | ORAL | Status: DC | PRN
  Administered 2019-06-08 (×2): 0.5 mg via ORAL
  Filled 2019-06-07 (×2): qty 1

## 2019-06-07 MED ORDER — ONDANSETRON HCL 4 MG PO TABS: 4.0000 mg | ORAL_TABLET | Freq: Four times a day (QID) | ORAL | Status: DC | PRN

## 2019-06-07 MED ORDER — FERROUS SULFATE 325 (65 FE) MG PO TABS
325.0000 mg | ORAL_TABLET | Freq: Two times a day (BID) | ORAL | Status: DC
  Administered 2019-06-07 – 2019-06-09 (×4): 325 mg via ORAL
  Filled 2019-06-07 (×4): qty 1

## 2019-06-07 MED ORDER — CEFAZOLIN SODIUM-DEXTROSE 2-4 GM/100ML-% IV SOLN
INTRAVENOUS | Status: AC
  Filled 2019-06-07: qty 100

## 2019-06-07 MED ORDER — PRAVASTATIN SODIUM 20 MG PO TABS
40.0000 mg | ORAL_TABLET | Freq: Every day | ORAL | Status: DC
  Administered 2019-06-07 – 2019-06-08 (×2): 40 mg via ORAL
  Filled 2019-06-07 (×2): qty 2

## 2019-06-07 MED ORDER — CHLORHEXIDINE GLUCONATE 4 % EX LIQD: 60.0000 mL | Freq: Once | CUTANEOUS | Status: DC

## 2019-06-07 MED ORDER — PHENYLEPHRINE 40 MCG/ML (10ML) SYRINGE FOR IV PUSH (FOR BLOOD PRESSURE SUPPORT)
PREFILLED_SYRINGE | INTRAVENOUS | Status: DC | PRN
  Administered 2019-06-07 (×2): 80 ug via INTRAVENOUS
  Administered 2019-06-07: 120 ug via INTRAVENOUS

## 2019-06-07 MED ORDER — ALUM & MAG HYDROXIDE-SIMETH 200-200-20 MG/5ML PO SUSP: 15.0000 mL | ORAL | Status: DC | PRN

## 2019-06-07 MED ORDER — POLYETHYLENE GLYCOL 3350 17 G PO PACK
17.0000 g | PACK | Freq: Two times a day (BID) | ORAL | Status: DC
  Administered 2019-06-08 – 2019-06-09 (×2): 17 g via ORAL
  Filled 2019-06-07 (×2): qty 1

## 2019-06-07 MED ORDER — ONDANSETRON HCL 4 MG/2ML IJ SOLN: 4.0000 mg | Freq: Once | INTRAMUSCULAR | Status: DC | PRN

## 2019-06-07 MED ORDER — DOCUSATE SODIUM 100 MG PO CAPS
100.0000 mg | ORAL_CAPSULE | Freq: Two times a day (BID) | ORAL | Status: DC
  Administered 2019-06-07 – 2019-06-09 (×4): 100 mg via ORAL
  Filled 2019-06-07 (×4): qty 1

## 2019-06-07 MED ORDER — CEFAZOLIN SODIUM-DEXTROSE 2-3 GM-%(50ML) IV SOLR
INTRAVENOUS | Status: DC | PRN
  Administered 2019-06-07: 2 g via INTRAVENOUS

## 2019-06-07 MED ORDER — BUPIVACAINE HCL (PF) 0.25 % IJ SOLN
INTRAMUSCULAR | Status: DC | PRN
  Administered 2019-06-07: 30 mL via INTRA_ARTICULAR

## 2019-06-07 MED ORDER — FLUTICASONE PROPIONATE 50 MCG/ACT NA SUSP
1.0000 | Freq: Every day | NASAL | Status: DC
  Administered 2019-06-08 – 2019-06-09 (×2): 1 via NASAL
  Filled 2019-06-07: qty 16

## 2019-06-07 MED ORDER — BUPIVACAINE IN DEXTROSE 0.75-8.25 % IT SOLN
INTRATHECAL | Status: DC | PRN
  Administered 2019-06-07: 1.8 mL via INTRATHECAL

## 2019-06-07 MED ORDER — POVIDONE-IODINE 10 % EX SWAB
2.0000 "application " | Freq: Once | CUTANEOUS | Status: AC
  Administered 2019-06-07: 2 via TOPICAL

## 2019-06-07 MED ORDER — ALLOPURINOL 100 MG PO TABS
100.0000 mg | ORAL_TABLET | Freq: Every evening | ORAL | Status: DC
  Administered 2019-06-08: 100 mg via ORAL
  Filled 2019-06-07 (×3): qty 1

## 2019-06-07 MED ORDER — POLYVINYL ALCOHOL 1.4 % OP SOLN: 1.0000 [drp] | Freq: Every day | OPHTHALMIC | Status: DC | PRN

## 2019-06-07 MED ORDER — CARBOXYMETHYLCELLUL-GLYCERIN 0.5-0.9 % OP SOLN: 1.0000 [drp] | Freq: Every day | OPHTHALMIC | Status: DC | PRN

## 2019-06-07 MED ORDER — BISACODYL 10 MG RE SUPP: 10.0000 mg | Freq: Every day | RECTAL | Status: DC | PRN

## 2019-06-07 MED ORDER — DEXAMETHASONE SODIUM PHOSPHATE 10 MG/ML IJ SOLN
10.0000 mg | Freq: Once | INTRAMUSCULAR | Status: AC
  Administered 2019-06-08: 10 mg via INTRAVENOUS
  Filled 2019-06-07: qty 1

## 2019-06-07 MED ORDER — MIDAZOLAM HCL 2 MG/2ML IJ SOLN
1.0000 mg | INTRAMUSCULAR | Status: DC
  Filled 2019-06-07: qty 2

## 2019-06-07 MED ORDER — INSULIN GLARGINE 100 UNIT/ML ~~LOC~~ SOLN
40.0000 [IU] | Freq: Every day | SUBCUTANEOUS | Status: DC
  Administered 2019-06-08: 40 [IU] via SUBCUTANEOUS
  Filled 2019-06-07 (×3): qty 0.4

## 2019-06-07 MED ORDER — SODIUM CHLORIDE 0.9 % IV SOLN
INTRAVENOUS | Status: DC
  Administered 2019-06-07: 09:00:00 via INTRAVENOUS

## 2019-06-07 MED ORDER — MORPHINE SULFATE (PF) 2 MG/ML IV SOLN
0.5000 mg | INTRAVENOUS | Status: DC | PRN
  Administered 2019-06-08: 1 mg via INTRAVENOUS
  Filled 2019-06-07: qty 1

## 2019-06-07 MED ORDER — FENTANYL CITRATE (PF) 100 MCG/2ML IJ SOLN
50.0000 ug | INTRAMUSCULAR | Status: DC
  Administered 2019-06-07 (×3): 50 ug via INTRAVENOUS
  Filled 2019-06-07: qty 2

## 2019-06-07 MED ORDER — HYDROCODONE-ACETAMINOPHEN 7.5-325 MG PO TABS
1.0000 | ORAL_TABLET | ORAL | Status: DC | PRN
  Administered 2019-06-07 – 2019-06-09 (×6): 2 via ORAL
  Filled 2019-06-07 (×6): qty 2

## 2019-06-07 MED ORDER — KETOROLAC TROMETHAMINE 30 MG/ML IJ SOLN
INTRAMUSCULAR | Status: DC | PRN
  Administered 2019-06-07: 30 mg via INTRA_ARTICULAR

## 2019-06-07 MED ORDER — MAGNESIUM CITRATE PO SOLN: 1.0000 | Freq: Once | ORAL | Status: DC | PRN

## 2019-06-07 MED ORDER — PANTOPRAZOLE SODIUM 20 MG PO TBEC
20.0000 mg | DELAYED_RELEASE_TABLET | Freq: Every day | ORAL | Status: DC
  Administered 2019-06-08 – 2019-06-09 (×2): 20 mg via ORAL
  Filled 2019-06-07 (×3): qty 1

## 2019-06-07 MED ORDER — PROPOFOL 500 MG/50ML IV EMUL
INTRAVENOUS | Status: DC | PRN
  Administered 2019-06-07: 75 ug/kg/min via INTRAVENOUS

## 2019-06-07 MED ORDER — PHENOL 1.4 % MT LIQD: 1.0000 | OROMUCOSAL | Status: DC | PRN

## 2019-06-07 MED ORDER — TOBRAMYCIN SULFATE 1.2 G IJ SOLR
INTRAMUSCULAR | Status: AC
  Filled 2019-06-07: qty 1.2

## 2019-06-07 MED ORDER — FUROSEMIDE 20 MG PO TABS: 20.0000 mg | ORAL_TABLET | Freq: Every day | ORAL | Status: DC | PRN

## 2019-06-07 MED ORDER — PROPOFOL 10 MG/ML IV BOLUS
INTRAVENOUS | Status: DC | PRN
  Administered 2019-06-07 (×2): 20 mg via INTRAVENOUS

## 2019-06-07 MED ORDER — ONDANSETRON HCL 4 MG/2ML IJ SOLN
INTRAMUSCULAR | Status: DC | PRN
  Administered 2019-06-07: 4 mg via INTRAVENOUS

## 2019-06-07 SURGICAL SUPPLY — 63 items
ADH SKN CLS APL DERMABOND .7 (GAUZE/BANDAGES/DRESSINGS) ×1
ATTUNE MED ANAT PAT 35 KNEE (Knees) ×2 IMPLANT
ATTUNE PSFEM RTSZ4 NARCEM KNEE (Femur) ×2 IMPLANT
ATTUNE PSRP INSR SZ4 7 KNEE (Insert) ×2 IMPLANT
BAG SPEC THK2 15X12 ZIP CLS (MISCELLANEOUS) ×1
BAG ZIPLOCK 12X15 (MISCELLANEOUS) ×2 IMPLANT
BASEPLATE TIBIAL ROTATING SZ 4 (Knees) ×2 IMPLANT
BLADE SAW SGTL 11.0X1.19X90.0M (BLADE) ×2 IMPLANT
BLADE SAW SGTL 13.0X1.19X90.0M (BLADE) ×2 IMPLANT
BLADE SURG SZ10 CARB STEEL (BLADE) ×4 IMPLANT
BNDG ELASTIC 6X10 VLCR STRL LF (GAUZE/BANDAGES/DRESSINGS) ×2 IMPLANT
BNDG ELASTIC 6X5.8 VLCR STR LF (GAUZE/BANDAGES/DRESSINGS) ×2 IMPLANT
BOWL SMART MIX CTS (DISPOSABLE) ×2 IMPLANT
BSPLAT TIB 4 CMNT ROT PLAT STR (Knees) ×1 IMPLANT
CEMENT HV SMART SET (Cement) ×4 IMPLANT
COVER SURGICAL LIGHT HANDLE (MISCELLANEOUS) ×2 IMPLANT
COVER WAND RF STERILE (DRAPES) IMPLANT
CUFF TOURN SGL QUICK 34 (TOURNIQUET CUFF) ×2
CUFF TRNQT CYL 34X4.125X (TOURNIQUET CUFF) ×1 IMPLANT
DECANTER SPIKE VIAL GLASS SM (MISCELLANEOUS) ×4 IMPLANT
DERMABOND ADVANCED (GAUZE/BANDAGES/DRESSINGS) ×1
DERMABOND ADVANCED .7 DNX12 (GAUZE/BANDAGES/DRESSINGS) ×1 IMPLANT
DRAPE U-SHAPE 47X51 STRL (DRAPES) ×2 IMPLANT
DRESSING AQUACEL AG SP 3.5X10 (GAUZE/BANDAGES/DRESSINGS) ×1 IMPLANT
DRSG AQUACEL AG ADV 3.5X10 (GAUZE/BANDAGES/DRESSINGS) ×2 IMPLANT
DRSG AQUACEL AG SP 3.5X10 (GAUZE/BANDAGES/DRESSINGS) ×2
DURAPREP 26ML APPLICATOR (WOUND CARE) ×4 IMPLANT
ELECT REM PT RETURN 15FT ADLT (MISCELLANEOUS) ×2 IMPLANT
GLOVE BIO SURGEON STRL SZ 6 (GLOVE) ×2 IMPLANT
GLOVE BIOGEL PI IND STRL 6.5 (GLOVE) ×1 IMPLANT
GLOVE BIOGEL PI IND STRL 7.5 (GLOVE) ×1 IMPLANT
GLOVE BIOGEL PI IND STRL 8.5 (GLOVE) ×1 IMPLANT
GLOVE BIOGEL PI INDICATOR 6.5 (GLOVE) ×1
GLOVE BIOGEL PI INDICATOR 7.5 (GLOVE) ×1
GLOVE BIOGEL PI INDICATOR 8.5 (GLOVE) ×1
GLOVE ECLIPSE 8.0 STRL XLNG CF (GLOVE) ×2 IMPLANT
GLOVE ORTHO TXT STRL SZ7.5 (GLOVE) ×2 IMPLANT
GOWN STRL REUS W/ TWL LRG LVL3 (GOWN DISPOSABLE) ×1 IMPLANT
GOWN STRL REUS W/TWL 2XL LVL3 (GOWN DISPOSABLE) ×2 IMPLANT
GOWN STRL REUS W/TWL LRG LVL3 (GOWN DISPOSABLE) ×4 IMPLANT
HANDPIECE INTERPULSE COAX TIP (DISPOSABLE) ×2
HOLDER FOLEY CATH W/STRAP (MISCELLANEOUS) ×2 IMPLANT
KIT TURNOVER KIT A (KITS) IMPLANT
MANIFOLD NEPTUNE II (INSTRUMENTS) ×2 IMPLANT
NDL SAFETY ECLIPSE 18X1.5 (NEEDLE) ×1 IMPLANT
NEEDLE HYPO 18GX1.5 SHARP (NEEDLE) ×2
NS IRRIG 1000ML POUR BTL (IV SOLUTION) ×2 IMPLANT
PACK TOTAL KNEE CUSTOM (KITS) ×2 IMPLANT
PIN DRILL FIX HALF THREAD (BIT) ×2 IMPLANT
PIN FIX SIGMA LCS THRD HI (PIN) ×2 IMPLANT
PROTECTOR NERVE ULNAR (MISCELLANEOUS) ×2 IMPLANT
SET HNDPC FAN SPRY TIP SCT (DISPOSABLE) ×1 IMPLANT
SET PAD KNEE POSITIONER (MISCELLANEOUS) ×2 IMPLANT
SUT MNCRL AB 4-0 PS2 18 (SUTURE) ×2 IMPLANT
SUT STRATAFIX PDS+ 0 24IN (SUTURE) ×2 IMPLANT
SUT VIC AB 1 CT1 36 (SUTURE) ×2 IMPLANT
SUT VIC AB 2-0 CT1 27 (SUTURE) ×3
SUT VIC AB 2-0 CT1 TAPERPNT 27 (SUTURE) ×3 IMPLANT
SYR 3ML LL SCALE MARK (SYRINGE) ×2 IMPLANT
TRAY FOLEY MTR SLVR 14FR STAT (SET/KITS/TRAYS/PACK) ×2 IMPLANT
WATER STERILE IRR 1000ML POUR (IV SOLUTION) ×4 IMPLANT
WRAP KNEE MAXI GEL POST OP (GAUZE/BANDAGES/DRESSINGS) ×2 IMPLANT
YANKAUER SUCT BULB TIP 10FT TU (MISCELLANEOUS) ×2 IMPLANT

## 2019-06-07 NOTE — Anesthesia Procedure Notes (Signed)
Spinal  Patient location during procedure: OR Start time: 06/07/2019 12:12 PM End time: 06/07/2019 12:20 PM Staffing Anesthesiologist: Barnet Glasgow, MD Performed: anesthesiologist  Preanesthetic Checklist Completed: patient identified, site marked, surgical consent, pre-op evaluation, timeout performed, IV checked, risks and benefits discussed and monitors and equipment checked Spinal Block Patient position: sitting Prep: DuraPrep Patient monitoring: blood pressure, continuous pulse ox, cardiac monitor and heart rate Approach: midline Location: L3-4 Injection technique: single-shot Needle Needle type: Pencan  Needle gauge: 24 G Needle length: 9 cm Needle insertion depth: 7 cm Assessment Sensory level: T4 Additional Notes 1 attempt after 2 attempts by CRNA. Pt tolerated procedure well.

## 2019-06-07 NOTE — Evaluation (Signed)
Physical Therapy Evaluation Patient Details Name: Kayla Bell MRN: [id number] DOB: 1946/06/19 Today's Date: 06/07/2019   History of Present Illness  Pt is 73 y.o. female s/p Rt TKA on 06/07/19 with PMH significant for DM, GERD, HTN, HLD, CKD, and arthritis.  Clinical Impression  Jack MARYLYN WILCOCK is a 73 y.o. female POD 0 s/p Rt TKA. Patient reports independence with mobility at baseline with no device. Patient is now limited by functional impairments (see PT problem list below) and requires min assist for transfers and gait with RW. Patient was able to ambulate ~ 75 feet with RW and minimal cues for safety. Patient instructed in exercise to facilitate ROM and circulation to manage edema. Patient will benefit from continued skilled PT interventions to address impairments and progress towards PLOF. Acute PT will follow to progress mobility and stair training in preparation for safe discharge home.      Follow Up Recommendations Follow surgeon's recommendation for DC plan and follow-up therapies    Equipment Recommendations  Rolling walker with 5" wheels(educated pt on where to obtain [facility] or shower seat)    Recommendations for Other Services       Precautions / Restrictions Precautions Precautions: Fall Restrictions Weight Bearing Restrictions: No      Mobility  Bed Mobility Overal bed mobility: Needs Assistance Bed Mobility: Supine to Sit     Supine to sit: Min assist     General bed mobility comments: cues for sequencing and assist to scoot EOB and steady upon upright sitting  Transfers Overall transfer level: Needs assistance Equipment used: Rolling walker (2 wheeled) Transfers: Sit to/from Stand Sit to Stand: Min assist         General transfer comment: cues for safe hand placement on RW and min assist to initiate power up, assist to steady upon rising  Ambulation/Gait Ambulation/Gait assistance: Min assist Gait Distance (Feet): 75 Feet Assistive device:  Rolling walker (2 wheeled) Gait Pattern/deviations: Step-through pattern;Decreased step length - right;Decreased stance time - right;Decreased stride length;Decreased weight shift to right Gait velocity: decreased   General Gait Details: min assist with cues for hand placement, cues for safe management of RW initially with improved use throughout gait  Stairs            Wheelchair Mobility    Modified Rankin (Stroke Patients Only)       Balance Overall balance assessment: Needs assistance Sitting-balance support: No upper extremity supported;Feet supported Sitting balance-Leahy Scale: Good     Standing balance support: Bilateral upper extremity supported;During functional activity Standing balance-Leahy Scale: Fair Standing balance comment: pt requires support for dynamic standing            Pertinent Vitals/Pain Pain Assessment: Faces Faces Pain Scale: Hurts a little bit Pain Location: Rt knee Pain Descriptors / Indicators: Sore;Guarding Pain Intervention(s): Limited activity within patient's tolerance;Monitored during session;Repositioned    Home Living Family/patient expects to be discharged to:: Private residence Living Arrangements: Spouse/significant other Available Help at Discharge: Family Type of Home: House Home Access: Stairs to enter Entrance Stairs-Rails: None Entrance Stairs-Number of Steps: 2 form screened in den/porch into kitchen Home Layout: One level Home Equipment: Environmental consultant - standard      Prior Function Level of Independence: Independent          Hand Dominance        Extremity/Trunk Assessment   Upper Extremity Assessment Upper Extremity Assessment: Overall WFL for tasks assessed    Lower Extremity Assessment Lower Extremity Assessment: Generalized weakness;RLE deficits/detail RLE  Deficits / Details: pt with moderate quad activation in supine, no buckling in WB       Communication   Communication: No difficulties   Cognition Arousal/Alertness: Awake/alert Behavior During Therapy: WFL for tasks assessed/performed Overall Cognitive Status: Within Functional Limits for tasks assessed               General Comments      Exercises     Assessment/Plan    PT Assessment Patient needs continued PT services  PT Problem List Decreased strength;Decreased balance;Decreased range of motion;Decreased activity tolerance;Decreased mobility       PT Treatment Interventions DME instruction;Functional mobility training;Balance training;Patient/family education;Modalities;Gait training;Therapeutic activities;Therapeutic exercise;Stair training    PT Goals (Current goals can be found in the Care Plan section)  Acute Rehab PT Goals Patient Stated Goal: to improve independence with mobility PT Goal Formulation: With patient Time For Goal Achievement: 06/14/19 Potential to Achieve Goals: Good    Frequency 7X/week    AM-PAC PT "6 Clicks" Mobility  Outcome Measure Help needed turning from your back to your side while in a flat bed without using bedrails?: A Little Help needed moving from lying on your back to sitting on the side of a flat bed without using bedrails?: A Little Help needed moving to and from a bed to a chair (including a wheelchair)?: A Little Help needed standing up from a chair using your arms (e.g., wheelchair or bedside chair)?: A Little Help needed to walk in hospital room?: A Little Help needed climbing 3-5 steps with a railing? : A Little 6 Click Score: 18    End of Session Equipment Utilized During Treatment: Gait belt Activity Tolerance: Patient tolerated treatment well Patient left: in chair;with call bell/phone within reach;with chair alarm set Nurse Communication: Mobility status PT Visit Diagnosis: Unsteadiness on feet (R26.81);Other abnormalities of gait and mobility (R26.89);Muscle weakness (generalized) (M62.81);Difficulty in walking, not elsewhere classified (R26.2)     Time: [id number] PT Time Calculation (min) (ACUTE ONLY): 30 min   Charges:   PT Evaluation $PT Eval Low Complexity: 1 Low PT Treatments $Therapeutic Exercise: 8-22 mins       Kipp Brood, PT, DPT, [facility] Physical Therapist with [facility]  06/07/2019 7:10 PM

## 2019-06-07 NOTE — Interval H&P Note (Signed)
History and Physical Interval Note:  06/07/2019 9:29 AM  Kayla Bell  has presented today for surgery, with the diagnosis of Right knee osteoarthritis.  The various methods of treatment have been discussed with the patient and family. After consideration of risks, benefits and other options for treatment, the patient has consented to  Procedure(s) with comments: TOTAL KNEE ARTHROPLASTY (Right) - 70 mins as a surgical intervention.  The patient's history has been reviewed, patient examined, no change in status, stable for surgery.  I have reviewed the patient's chart and labs.  Questions were answered to the patient's satisfaction.     Mauri Pole

## 2019-06-07 NOTE — Transfer of Care (Signed)
Immediate Anesthesia Transfer of Care Note  Patient: Kayla Bell  Procedure(s) Performed: TOTAL KNEE ARTHROPLASTY (Right Knee)  Patient Location: PACU  Anesthesia Type:Spinal  Level of Consciousness: awake, alert , oriented and patient cooperative  Airway & Oxygen Therapy: Patient Spontanous Breathing and Patient connected to face mask  Post-op Assessment: Report given to RN and Post -op Vital signs reviewed and stable  Post vital signs: Reviewed and stable  Last Vitals:  Vitals Value Taken Time  BP 116/60 06/07/19 1407  Temp    Pulse 61 06/07/19 1409  Resp 18 06/07/19 1409  SpO2 100 % 06/07/19 1409  Vitals shown include unvalidated device data.  Last Pain:  Vitals:   06/07/19 0832  TempSrc:   PainSc: 8       Patients Stated Pain Goal: 4 ([id number])  Complications: No apparent anesthesia complications

## 2019-06-07 NOTE — Progress Notes (Signed)
Updated pt and family of delay for surgery.

## 2019-06-07 NOTE — Anesthesia Procedure Notes (Addendum)
Anesthesia Regional Block: Adductor canal block   Pre-Anesthetic Checklist: ,, timeout performed, Correct Patient, Correct Site, Correct Laterality, Correct Procedure, Correct Position, site marked, Risks and benefits discussed,  Surgical consent,  Pre-op evaluation,  At surgeon's request and post-op pain management  Laterality: Lower and Right  Prep: chloraprep       Needles:  Injection technique: Single-shot  Needle Type: Echogenic Needle     Needle Length: 9cm  Needle Gauge: 22     Additional Needles:   Procedures:,,,, ultrasound used (permanent image in chart),,,,  Narrative:  Start time: 06/07/2019 10:19 AM End time: 06/07/2019 10:25 AM Injection made incrementally with aspirations every 5 mL.  Performed by: Personally  Anesthesiologist: Barnet Glasgow, MD  Additional Notes: Block assessed prior to surgery. Pt tolerated procedure well.

## 2019-06-07 NOTE — Discharge Instructions (Signed)

## 2019-06-07 NOTE — Op Note (Signed)
NAME:  Kayla Bell                      MEDICAL RECORD NO.:  [id number]                             FACILITY:  [facility]      PHYSICIAN:  Pietro Cassis. Alvan Dame, M.D.  DATE OF BIRTH:  June 27, 1946      DATE OF PROCEDURE:  06/07/2019                                     OPERATIVE REPORT         PREOPERATIVE DIAGNOSIS:  Right knee osteoarthritis.      POSTOPERATIVE DIAGNOSIS:  Right knee osteoarthritis.      FINDINGS:  The patient was noted to have complete loss of cartilage and   bone-on-bone arthritis with associated osteophytes in the lateral and patellofemoral compartments of   the knee with a significant synovitis and associated effusion.  The patient had failed months of conservative treatment including medications, injection therapy, activity modification.     PROCEDURE:  Right total knee replacement.      COMPONENTS USED:  DePuy Attune rotating platform posterior stabilized knee   system, a size 4N femur, 4 tibia, size 7 mm PS AOX insert, and 35 anatomic patellar   button.      SURGEON:  Pietro Cassis. Alvan Dame, M.D.      ASSISTANT:  Griffith Citron, PA-C.      ANESTHESIA:  Regional and Spinal.      SPECIMENS:  None.      COMPLICATION:  None.      DRAINS:  None.  EBL: <100cc      TOURNIQUET TIME:  33 min at 250 mmHg     The patient was stable to the recovery room.      INDICATION FOR PROCEDURE:  KIARNA HEGGER is a 73 y.o. female patient of   mine.  The patient had been seen, evaluated, and treated for months conservatively in the   office with medication, activity modification, and injections.  The patient had   radiographic changes of bone-on-bone arthritis with endplate sclerosis and osteophytes noted.  Based on the radiographic changes and failed conservative measures, the patient   decided to proceed with definitive treatment, total knee replacement.  Risks of infection, DVT, component failure, need for revision surgery, neurovascular injury were reviewed in the office  setting.  The postop course was reviewed stressing the efforts to maximize post-operative satisfaction and function.  Consent was obtained for benefit of pain   relief.      PROCEDURE IN DETAIL:  The patient was brought to the operative theater.   Once adequate anesthesia, preoperative antibiotics, 2 gm of Ancef,1 gm of Tranexamic Acid, and 10 mg of Decadron administered, the patient was positioned supine with a right thigh tourniquet placed.  The  right lower extremity was prepped and draped in sterile fashion.  A time-   out was performed identifying the patient, planned procedure, and the appropriate extremity.      The right lower extremity was placed in the [facility] leg holder.  The leg was   exsanguinated, tourniquet elevated to 250 mmHg.  A midline incision was   made followed by median parapatellar arthrotomy.  Following initial   exposure, attention was first  directed to the patella.  Precut   measurement was noted to be 22 mm.  I resected down to 13 mm and used a   35 anatomic patellar button to restore patellar height as well as cover the cut surface.      The lug holes were drilled and a metal shim was placed to protect the   patella from retractors and saw blade during the procedure.      At this point, attention was now directed to the femur.  The femoral   canal was opened with a drill, irrigated to try to prevent fat emboli.  An   intramedullary rod was passed at 3 degrees valgus, 9 mm of bone was   resected off the distal femur.  Following this resection, the tibia was   subluxated anteriorly.  Using the extramedullary guide, 2 mm of bone was resected off   the proximal lateral tibia.  We confirmed the gap would be   stable medially and laterally with a size 5 spacer block as well as confirmed that the tibial cut was perpendicular in the coronal plane, checking with an alignment rod.      Once this was done, I sized the femur to be a size 4 in the anterior-   posterior  dimension, chose a narrow component based on medial and   lateral dimension.  The size 4 rotation block was then pinned in   position anterior referenced using the C-clamp to set rotation.  The   anterior, posterior, and  chamfer cuts were made without difficulty nor   notching making certain that I was along the anterior cortex to help   with flexion gap stability.      The final box cut was made off the lateral aspect of distal femur.      At this point, the tibia was sized to be a size 4.  The size 4 tray was   then pinned in position through the medial third of the tubercle,   drilled, and keel punched.  Trial reduction was now carried with a 4 femur,  4 tibia, a size 7 mm PS insert, and the 35 anatomic patella botton.  The knee was brought to full extension with good flexion stability with the patella   tracking through the trochlea without application of pressure.  Given   all these findings the trial components removed.  Final components were   opened and cement was mixed.  The knee was irrigated with normal saline solution and pulse lavage.  The synovial lining was   then injected with 30 cc of 0.25% Marcaine with epinephrine, 1 cc of Toradol and 30 cc of NS for a total of 61 cc.     Final implants were then cemented onto cleaned and dried cut surfaces of bone with the knee brought to extension with a size 7 mm PS trial insert.      Once the cement had fully cured, excess cement was removed   throughout the knee.  I confirmed that I was satisfied with the range of   motion and stability, and the final size 7 mm PS AOX insert was chosen.  It was   placed into the knee.      The tourniquet had been let down at 33 minutes.  No significant   hemostasis was required.  The extensor mechanism was then reapproximated using #1 Vicryl and #1 Stratafix sutures with the knee   in flexion.  The  remaining wound was closed with 2-0 Vicryl and running 4-0 Monocryl.   The knee was cleaned,  dried, dressed sterilely using Dermabond and   Aquacel dressing.  The patient was then   brought to recovery room in stable condition, tolerating the procedure   well.   Please note that Physician Assistant, Griffith Citron, PA-C was present for the entirety of the case, and was utilized for pre-operative positioning, peri-operative retractor management, general facilitation of the procedure and for primary wound closure at the end of the case.              Pietro Cassis Alvan Dame, M.D.    06/07/2019 11:23 AM

## 2019-06-07 NOTE — Progress Notes (Signed)
This nurse spoke with Ron, CNRA. States Alvan Dame verbalized trying ancef with patient slowly. This will discontinue Vanc order. Primary Nurse made aware

## 2019-06-07 NOTE — Progress Notes (Signed)
AssistedDr. Houser with right, ultrasound guided, adductor canal block. Side rails up, monitors on throughout procedure. See vital signs in flow sheet. Tolerated Procedure well.  

## 2019-06-07 NOTE — Anesthesia Postprocedure Evaluation (Signed)
Anesthesia Post Note  Patient: Kayla Bell  Procedure(s) Performed: TOTAL KNEE ARTHROPLASTY (Right Knee)     Patient location during evaluation: Nursing Unit Anesthesia Type: Regional Level of consciousness: oriented and awake and alert Pain management: pain level controlled Vital Signs Assessment: post-procedure vital signs reviewed and stable Respiratory status: spontaneous breathing and respiratory function stable Cardiovascular status: blood pressure returned to baseline and stable Postop Assessment: no headache, no backache, no apparent nausea or vomiting and patient able to bend at knees Anesthetic complications: no    Last Vitals:  Vitals:   06/07/19 1407 06/07/19 1415  BP: 116/60 (!) 133/59  Pulse: 62 (!) 58  Resp: 18 16  Temp: 36.4 C   SpO2: 100% 100%    Last Pain:  Vitals:   06/07/19 1407  TempSrc:   PainSc: 0-No pain                 Barnet Glasgow

## 2019-06-08 ENCOUNTER — Encounter (HOSPITAL_COMMUNITY): Payer: Self-pay | Admitting: Orthopedic Surgery

## 2019-06-08 DIAGNOSIS — N183 Chronic kidney disease, stage 3 (moderate): Secondary | ICD-10-CM | POA: Diagnosis not present

## 2019-06-08 DIAGNOSIS — I5032 Chronic diastolic (congestive) heart failure: Secondary | ICD-10-CM | POA: Diagnosis not present

## 2019-06-08 DIAGNOSIS — M1711 Unilateral primary osteoarthritis, right knee: Secondary | ICD-10-CM | POA: Diagnosis not present

## 2019-06-08 DIAGNOSIS — I130 Hypertensive heart and chronic kidney disease with heart failure and stage 1 through stage 4 chronic kidney disease, or unspecified chronic kidney disease: Secondary | ICD-10-CM | POA: Diagnosis not present

## 2019-06-08 DIAGNOSIS — M109 Gout, unspecified: Secondary | ICD-10-CM | POA: Diagnosis not present

## 2019-06-08 DIAGNOSIS — Z7982 Long term (current) use of aspirin: Secondary | ICD-10-CM | POA: Diagnosis not present

## 2019-06-08 DIAGNOSIS — Z794 Long term (current) use of insulin: Secondary | ICD-10-CM | POA: Diagnosis not present

## 2019-06-08 DIAGNOSIS — Z79899 Other long term (current) drug therapy: Secondary | ICD-10-CM | POA: Diagnosis not present

## 2019-06-08 DIAGNOSIS — K219 Gastro-esophageal reflux disease without esophagitis: Secondary | ICD-10-CM | POA: Diagnosis not present

## 2019-06-08 DIAGNOSIS — I255 Ischemic cardiomyopathy: Secondary | ICD-10-CM | POA: Diagnosis not present

## 2019-06-08 DIAGNOSIS — E785 Hyperlipidemia, unspecified: Secondary | ICD-10-CM | POA: Diagnosis not present

## 2019-06-08 LAB — CBC
HCT: 34.7 % — ABNORMAL LOW (ref 36.0–46.0)
Hemoglobin: 10.5 g/dL — ABNORMAL LOW (ref 12.0–15.0)
MCH: 26.8 pg (ref 26.0–34.0)
MCHC: 30.3 g/dL (ref 30.0–36.0)
MCV: 88.5 fL (ref 80.0–100.0)
Platelets: 253 10*3/uL (ref 150–400)
RBC: 3.92 MIL/uL (ref 3.87–5.11)
RDW: 14.7 % (ref 11.5–15.5)
WBC: 9.6 10*3/uL (ref 4.0–10.5)
nRBC: 0 % (ref 0.0–0.2)

## 2019-06-08 LAB — GLUCOSE, CAPILLARY
Glucose-Capillary: 226 mg/dL — ABNORMAL HIGH (ref 70–99)
Glucose-Capillary: 257 mg/dL — ABNORMAL HIGH (ref 70–99)
Glucose-Capillary: 285 mg/dL — ABNORMAL HIGH (ref 70–99)
Glucose-Capillary: 235 mg/dL — ABNORMAL HIGH (ref 70–99)

## 2019-06-08 LAB — BASIC METABOLIC PANEL
Anion gap: 9 (ref 5–15)
BUN: 35 mg/dL — ABNORMAL HIGH (ref 8–23)
CO2: 21 mmol/L — ABNORMAL LOW (ref 22–32)
Calcium: 8.4 mg/dL — ABNORMAL LOW (ref 8.9–10.3)
Chloride: 106 mmol/L (ref 98–111)
Creatinine, Ser: 1.92 mg/dL — ABNORMAL HIGH (ref 0.44–1.00)
GFR calc Af Amer: 30 mL/min — ABNORMAL LOW (ref >60)
GFR calc non Af Amer: 26 mL/min — ABNORMAL LOW (ref >60)
Glucose, Bld: 238 mg/dL — ABNORMAL HIGH (ref 70–99)
Potassium: 5.6 mmol/L — ABNORMAL HIGH (ref 3.5–5.1)
Sodium: 136 mmol/L (ref 135–145)

## 2019-06-08 MED ORDER — ASPIRIN 81 MG PO CHEW: 81.0000 mg | CHEWABLE_TABLET | Freq: Two times a day (BID) | ORAL | 0 refills | Status: AC

## 2019-06-08 MED ORDER — METHOCARBAMOL 500 MG PO TABS: 500.0000 mg | ORAL_TABLET | Freq: Four times a day (QID) | ORAL | 0 refills | Status: DC | PRN

## 2019-06-08 MED ORDER — FUROSEMIDE 10 MG/ML IJ SOLN
10.0000 mg | Freq: Once | INTRAMUSCULAR | Status: AC
  Administered 2019-06-08: 10 mg via INTRAVENOUS
  Filled 2019-06-08: qty 2

## 2019-06-08 MED ORDER — HYDROCODONE-ACETAMINOPHEN 7.5-325 MG PO TABS: 1.0000 | ORAL_TABLET | ORAL | 0 refills | Status: DC | PRN

## 2019-06-08 MED ORDER — DOCUSATE SODIUM 100 MG PO CAPS: 100.0000 mg | ORAL_CAPSULE | Freq: Two times a day (BID) | ORAL | 0 refills | Status: DC

## 2019-06-08 MED ORDER — POLYETHYLENE GLYCOL 3350 17 G PO PACK: 17.0000 g | PACK | Freq: Two times a day (BID) | ORAL | 0 refills | Status: DC

## 2019-06-08 MED ORDER — FERROUS SULFATE 325 (65 FE) MG PO TABS: 325.0000 mg | ORAL_TABLET | Freq: Three times a day (TID) | ORAL | 0 refills | Status: DC

## 2019-06-08 NOTE — Progress Notes (Signed)
Physical Therapy Treatment Patient Details Name: Kayla Bell MRN: [id number] DOB: 03/13/1946 Today's Date: 06/08/2019    History of Present Illness Pt is 73 y.o. female s/p Rt TKA on 06/07/19 with PMH significant for DM, GERD, HTN, HLD, CKD, and arthritis.    PT Comments    Pt continues to progress steadily with mobility and therex.  Pt hopeful for dc home tomorrow.   Follow Up Recommendations  Follow surgeon's recommendation for DC plan and follow-up therapies     Equipment Recommendations  Rolling walker with 5" wheels    Recommendations for Other Services       Precautions / Restrictions Precautions Precautions: Fall Restrictions Weight Bearing Restrictions: No    Mobility  Bed Mobility Overal bed mobility: Needs Assistance Bed Mobility: Supine to Sit;Sit to Supine     Supine to sit: Supervision Sit to supine: Min guard   General bed mobility comments: cues for sequence and use of L LE to self assist  Transfers Overall transfer level: Needs assistance Equipment used: Rolling walker (2 wheeled) Transfers: Sit to/from Stand Sit to Stand: Min guard;Supervision         General transfer comment: cues for LE management and use of UEs to self assist  Ambulation/Gait Ambulation/Gait assistance: Min assist;Min guard Gait Distance (Feet): 180 Feet Assistive device: Rolling walker (2 wheeled) Gait Pattern/deviations: Step-to pattern;Step-through pattern;Decreased step length - right;Decreased step length - left;Shuffle;Trunk flexed Gait velocity: decreased   General Gait Details: cues for posture, position from RW and sequence   Stairs             Wheelchair Mobility    Modified Rankin (Stroke Patients Only)       Balance Overall balance assessment: Needs assistance Sitting-balance support: No upper extremity supported;Feet supported Sitting balance-Leahy Scale: Good     Standing balance support: Bilateral upper extremity  supported;During functional activity Standing balance-Leahy Scale: Fair                              Cognition Arousal/Alertness: Awake/alert Behavior During Therapy: WFL for tasks assessed/performed Overall Cognitive Status: Within Functional Limits for tasks assessed                                        Exercises Total Joint Exercises Ankle Circles/Pumps: AROM;Both;15 reps;Supine Quad Sets: AROM;10 reps;Both;Supine Heel Slides: AAROM;Right;10 reps;Supine Straight Leg Raises: AAROM;AROM;Right;10 reps;Supine    General Comments        Pertinent Vitals/Pain Pain Assessment: 0-10 Pain Score: 6  Pain Location: Rt knee Pain Descriptors / Indicators: Sore;Guarding Pain Intervention(s): Limited activity within patient's tolerance;Premedicated before session;Monitored during session;Ice applied    Home Living                      Prior Function            PT Goals (current goals can now be found in the care plan section) Acute Rehab PT Goals Patient Stated Goal: to improve independence with mobility PT Goal Formulation: With patient Time For Goal Achievement: 06/14/19 Potential to Achieve Goals: Good Progress towards PT goals: Progressing toward goals    Frequency    7X/week      PT Plan Current plan remains appropriate    Co-evaluation              AM-PAC PT "  6 Clicks" Mobility   Outcome Measure  Help needed turning from your back to your side while in a flat bed without using bedrails?: A Little Help needed moving from lying on your back to sitting on the side of a flat bed without using bedrails?: A Little Help needed moving to and from a bed to a chair (including a wheelchair)?: A Little Help needed standing up from a chair using your arms (e.g., wheelchair or bedside chair)?: A Little Help needed to walk in hospital room?: A Little Help needed climbing 3-5 steps with a railing? : A Little 6 Click Score: 18     End of Session Equipment Utilized During Treatment: Gait belt Activity Tolerance: Patient tolerated treatment well Patient left: in bed;with call bell/phone within reach Nurse Communication: Mobility status PT Visit Diagnosis: Unsteadiness on feet (R26.81);Other abnormalities of gait and mobility (R26.89);Muscle weakness (generalized) (M62.81);Difficulty in walking, not elsewhere classified (R26.2)     Time: [id number] PT Time Calculation (min) (ACUTE ONLY): 33 min  Charges:  $Gait Training: 8-22 mins $Therapeutic Exercise: 8-22 mins                     [place] Pager [phone] Office [phone]    Abrina Petz 06/08/2019, 2:29 PM

## 2019-06-08 NOTE — Progress Notes (Signed)
Physical Therapy Treatment Patient Details Name: Kayla Bell MRN: [id number] DOB: 1946/01/20 Today's Date: 06/08/2019    History of Present Illness Pt is 73 y.o. female s/p Rt TKA on 06/07/19 with PMH significant for DM, GERD, HTN, HLD, CKD, and arthritis.    PT Comments    Pt motivated and progressing well with mobility.  Follow Up Recommendations  Follow surgeon's recommendation for DC plan and follow-up therapies     Equipment Recommendations  Rolling walker with 5" wheels    Recommendations for Other Services       Precautions / Restrictions Precautions Precautions: Fall Restrictions Weight Bearing Restrictions: No    Mobility  Bed Mobility Overal bed mobility: Needs Assistance Bed Mobility: Supine to Sit     Supine to sit: Min guard     General bed mobility comments: cues for sequence and use of L LE to self assist  Transfers Overall transfer level: Needs assistance Equipment used: Rolling walker (2 wheeled) Transfers: Sit to/from Stand Sit to Stand: Min guard         General transfer comment: cues for LE management and use of UEs to self assist  Ambulation/Gait Ambulation/Gait assistance: Min assist;Min guard Gait Distance (Feet): 78 Feet(and 15' into bathroom) Assistive device: Rolling walker (2 wheeled) Gait Pattern/deviations: Step-to pattern;Step-through pattern;Decreased step length - right;Decreased step length - left;Shuffle;Trunk flexed Gait velocity: decreased   General Gait Details: cues for posture, position from RW and sequence; Initial mild buckling and antalgic gait on R LE but marked improvement with increased distance ambulated   Stairs             Wheelchair Mobility    Modified Rankin (Stroke Patients Only)       Balance Overall balance assessment: Needs assistance Sitting-balance support: No upper extremity supported;Feet supported Sitting balance-Leahy Scale: Good     Standing balance support: Bilateral  upper extremity supported;During functional activity Standing balance-Leahy Scale: Fair                              Cognition Arousal/Alertness: Awake/alert Behavior During Therapy: WFL for tasks assessed/performed Overall Cognitive Status: Within Functional Limits for tasks assessed                                        Exercises Total Joint Exercises Ankle Circles/Pumps: AROM;Both;15 reps;Supine Quad Sets: AROM;10 reps;Both;Supine Heel Slides: AAROM;Right;10 reps;Supine Straight Leg Raises: AAROM;AROM;Right;10 reps;Supine Goniometric ROM: AAROM R knee -8 - 75    General Comments        Pertinent Vitals/Pain Pain Assessment: 0-10 Pain Score: 7  Pain Location: Rt knee Pain Descriptors / Indicators: Sore;Guarding Pain Intervention(s): Limited activity within patient's tolerance;Monitored during session;Premedicated before session;Ice applied    Home Living                      Prior Function            PT Goals (current goals can now be found in the care plan section) Acute Rehab PT Goals Patient Stated Goal: to improve independence with mobility PT Goal Formulation: With patient Time For Goal Achievement: 06/14/19 Potential to Achieve Goals: Good Progress towards PT goals: Progressing toward goals    Frequency    7X/week      PT Plan Current plan remains appropriate    Co-evaluation  AM-PAC PT "6 Clicks" Mobility   Outcome Measure  Help needed turning from your back to your side while in a flat bed without using bedrails?: A Little Help needed moving from lying on your back to sitting on the side of a flat bed without using bedrails?: A Little Help needed moving to and from a bed to a chair (including a wheelchair)?: A Little Help needed standing up from a chair using your arms (e.g., wheelchair or bedside chair)?: A Little Help needed to walk in hospital room?: A Little Help needed climbing 3-5  steps with a railing? : A Little 6 Click Score: 18    End of Session Equipment Utilized During Treatment: Gait belt Activity Tolerance: Patient tolerated treatment well Patient left: in chair;with call bell/phone within reach;with chair alarm set Nurse Communication: Mobility status PT Visit Diagnosis: Unsteadiness on feet (R26.81);Other abnormalities of gait and mobility (R26.89);Muscle weakness (generalized) (M62.81);Difficulty in walking, not elsewhere classified (R26.2)     Time: [id number] PT Time Calculation (min) (ACUTE ONLY): 33 min  Charges:  $Gait Training: 8-22 mins $Therapeutic Exercise: 8-22 mins                     [place] Pager [phone] Office [phone]    Kayla Bell 06/08/2019, 9:06 AM

## 2019-06-08 NOTE — Care Management Obs Status (Signed)
[place] NOTIFICATION   Patient Details  Name: Kayla Bell MRN: [id number] Date of Birth: March 19, 1946   Medicare Observation Status Notification Given:  Yes    Leeroy Cha, RN 06/08/2019, 9:54 AM

## 2019-06-08 NOTE — Plan of Care (Signed)
Plan of care reviewed and discussed with the patient. 

## 2019-06-08 NOTE — Progress Notes (Signed)
Patient ID: Kayla Bell, female   DOB: 10-30-1945, 73 y.o.   MRN: [id number] Subjective: 1 Day Post-Op Procedure(s) (LRB): TOTAL KNEE ARTHROPLASTY (Right)    Patient reports pain as moderate. Very tough night until about 5 am No other events  Objective:   VITALS:   Vitals:   06/08/19 0150 06/08/19 0614  BP: 138/63 135/66  Pulse: 72 71  Resp: 16 16  Temp: 98 F (36.7 C) 97.6 F (36.4 C)  SpO2: 99% 100%    Neurovascular intact Incision: dressing C/D/I  LABS Recent Labs    06/08/19 0255  HGB 10.5*  HCT 34.7*  WBC 9.6  PLT 253    Recent Labs    06/07/19 0826 06/08/19 0255  NA 138 136  K 4.5 5.6*  BUN 38* 35*  CREATININE 2.14* 1.92*  GLUCOSE 106* 238*    No results for input(s): LABPT, INR in the last 72 hours.   Assessment/Plan: 1 Day Post-Op Procedure(s) (LRB): TOTAL KNEE ARTHROPLASTY (Right)   Advance diet Up with therapy Plan for discharge tomorrow   Due to her renal impairment and rise in Cr and Potassium I will keep her in hospital for an additional night to follow labs Repeat labs in am  In addition the extra night will allow for better pain control efforts to assure safe home discharge

## 2019-06-09 DIAGNOSIS — M1711 Unilateral primary osteoarthritis, right knee: Secondary | ICD-10-CM | POA: Diagnosis not present

## 2019-06-09 DIAGNOSIS — E785 Hyperlipidemia, unspecified: Secondary | ICD-10-CM | POA: Diagnosis not present

## 2019-06-09 DIAGNOSIS — Z79899 Other long term (current) drug therapy: Secondary | ICD-10-CM | POA: Diagnosis not present

## 2019-06-09 DIAGNOSIS — I130 Hypertensive heart and chronic kidney disease with heart failure and stage 1 through stage 4 chronic kidney disease, or unspecified chronic kidney disease: Secondary | ICD-10-CM | POA: Diagnosis not present

## 2019-06-09 DIAGNOSIS — M109 Gout, unspecified: Secondary | ICD-10-CM | POA: Diagnosis not present

## 2019-06-09 DIAGNOSIS — I255 Ischemic cardiomyopathy: Secondary | ICD-10-CM | POA: Diagnosis not present

## 2019-06-09 DIAGNOSIS — Z794 Long term (current) use of insulin: Secondary | ICD-10-CM | POA: Diagnosis not present

## 2019-06-09 DIAGNOSIS — K219 Gastro-esophageal reflux disease without esophagitis: Secondary | ICD-10-CM | POA: Diagnosis not present

## 2019-06-09 DIAGNOSIS — Z96651 Presence of right artificial knee joint: Secondary | ICD-10-CM | POA: Diagnosis not present

## 2019-06-09 DIAGNOSIS — I5032 Chronic diastolic (congestive) heart failure: Secondary | ICD-10-CM | POA: Diagnosis not present

## 2019-06-09 DIAGNOSIS — N183 Chronic kidney disease, stage 3 (moderate): Secondary | ICD-10-CM | POA: Diagnosis not present

## 2019-06-09 DIAGNOSIS — Z7982 Long term (current) use of aspirin: Secondary | ICD-10-CM | POA: Diagnosis not present

## 2019-06-09 LAB — CBC
HCT: 31.7 % — ABNORMAL LOW (ref 36.0–46.0)
Hemoglobin: 9.9 g/dL — ABNORMAL LOW (ref 12.0–15.0)
MCH: 27.3 pg (ref 26.0–34.0)
MCHC: 31.2 g/dL (ref 30.0–36.0)
MCV: 87.6 fL (ref 80.0–100.0)
Platelets: 243 10*3/uL (ref 150–400)
RBC: 3.62 MIL/uL — ABNORMAL LOW (ref 3.87–5.11)
RDW: 14.6 % (ref 11.5–15.5)
WBC: 11.5 10*3/uL — ABNORMAL HIGH (ref 4.0–10.5)
nRBC: 0 % (ref 0.0–0.2)

## 2019-06-09 LAB — BASIC METABOLIC PANEL
Anion gap: 8 (ref 5–15)
BUN: 48 mg/dL — ABNORMAL HIGH (ref 8–23)
CO2: 23 mmol/L (ref 22–32)
Calcium: 8.7 mg/dL — ABNORMAL LOW (ref 8.9–10.3)
Chloride: 106 mmol/L (ref 98–111)
Creatinine, Ser: 1.83 mg/dL — ABNORMAL HIGH (ref 0.44–1.00)
GFR calc Af Amer: 31 mL/min — ABNORMAL LOW (ref >60)
GFR calc non Af Amer: 27 mL/min — ABNORMAL LOW (ref >60)
Glucose, Bld: 200 mg/dL — ABNORMAL HIGH (ref 70–99)
Potassium: 5.3 mmol/L — ABNORMAL HIGH (ref 3.5–5.1)
Sodium: 137 mmol/L (ref 135–145)

## 2019-06-09 LAB — GLUCOSE, CAPILLARY
Glucose-Capillary: 151 mg/dL — ABNORMAL HIGH (ref 70–99)
Glucose-Capillary: 132 mg/dL — ABNORMAL HIGH (ref 70–99)

## 2019-06-09 NOTE — Plan of Care (Signed)
  Problem: Education: ?Goal: Knowledge of General Education information will improve ?Description: Including pain rating scale, medication(s)/side effects and non-pharmacologic comfort measures ?Outcome: Progressing ?  ?Problem: Clinical Measurements: ?Goal: Will remain free from infection ?Outcome: Progressing ?  ?Problem: Activity: ?Goal: Risk for activity intolerance will decrease ?Outcome: Progressing ?  ?Problem: Coping: ?Goal: Level of anxiety will decrease ?Outcome: Progressing ?  ?Problem: Safety: ?Goal: Ability to remain free from injury will improve ?Outcome: Progressing ?  ?

## 2019-06-09 NOTE — Progress Notes (Signed)
Physical Therapy Treatment Patient Details Name: Kayla Bell MRN: [id number] DOB: 08/05/1946 Today's Date: 06/09/2019    History of Present Illness Pt is 73 y.o. female s/p Rt TKA on 06/07/19 with PMH significant for DM, GERD, HTN, HLD, CKD, and arthritis.    PT Comments    Pt continues motivated and progressing well.  This am pt performed home therex and negotiated stairs - written instruction provided.   Follow Up Recommendations  Follow surgeon's recommendation for DC plan and follow-up therapies     Equipment Recommendations  Rolling walker with 5" wheels    Recommendations for Other Services       Precautions / Restrictions Precautions Precautions: Fall Restrictions Weight Bearing Restrictions: No    Mobility  Bed Mobility Overal bed mobility: Needs Assistance Bed Mobility: Supine to Sit     Supine to sit: Supervision     General bed mobility comments: Increased time but no physical assist  Transfers Overall transfer level: Needs assistance Equipment used: Rolling walker (2 wheeled) Transfers: Sit to/from Stand Sit to Stand: Supervision         General transfer comment: cues for LE management and use of UEs to self assist  Ambulation/Gait Ambulation/Gait assistance: Min guard;Supervision Gait Distance (Feet): 150 Feet Assistive device: Rolling walker (2 wheeled) Gait Pattern/deviations: Step-to pattern;Step-through pattern;Decreased step length - right;Decreased step length - left;Shuffle;Trunk flexed Gait velocity: decreased   General Gait Details: cues for posture, position from RW and sequence   Stairs Stairs: Yes Stairs assistance: Min assist Stair Management: No rails;Step to pattern;Backwards;With walker Number of Stairs: 2 General stair comments: cues for sequence and foot/RW placement; written instruction provided   Wheelchair Mobility    Modified Rankin (Stroke Patients Only)       Balance Overall balance assessment:  Needs assistance Sitting-balance support: No upper extremity supported;Feet supported Sitting balance-Leahy Scale: Good     Standing balance support: Bilateral upper extremity supported;During functional activity Standing balance-Leahy Scale: Fair Standing balance comment: one episode balance loss with pt standing and attempting to zip up robe with both hands off RW                            Cognition Arousal/Alertness: Awake/alert Behavior During Therapy: WFL for tasks assessed/performed Overall Cognitive Status: Within Functional Limits for tasks assessed                                        Exercises Total Joint Exercises Ankle Circles/Pumps: AROM;Both;15 reps;Supine Quad Sets: AROM;10 reps;Both;Supine Heel Slides: AAROM;Right;Supine;15 reps Straight Leg Raises: AAROM;AROM;Right;Supine;20 reps Goniometric ROM: AAROM R knee -8 - 75    General Comments        Pertinent Vitals/Pain Pain Assessment: 0-10 Pain Score: 6  Pain Location: Rt knee Pain Descriptors / Indicators: Aching;Sore Pain Intervention(s): Limited activity within patient's tolerance;Monitored during session;Premedicated before session;Ice applied    Home Living                      Prior Function            PT Goals (current goals can now be found in the care plan section) Acute Rehab PT Goals Patient Stated Goal: to improve independence with mobility PT Goal Formulation: With patient Time For Goal Achievement: 06/14/19 Potential to Achieve Goals: Good Progress towards PT goals: Progressing toward goals  Frequency    7X/week      PT Plan Current plan remains appropriate    Co-evaluation              AM-PAC PT "6 Clicks" Mobility   Outcome Measure  Help needed turning from your back to your side while in a flat bed without using bedrails?: A Little Help needed moving from lying on your back to sitting on the side of a flat bed without using  bedrails?: A Little Help needed moving to and from a bed to a chair (including a wheelchair)?: A Little Help needed standing up from a chair using your arms (e.g., wheelchair or bedside chair)?: A Little Help needed to walk in hospital room?: A Little Help needed climbing 3-5 steps with a railing? : A Little 6 Click Score: 18    End of Session Equipment Utilized During Treatment: Gait belt Activity Tolerance: Patient tolerated treatment well Patient left: in chair;with call bell/phone within reach;with chair alarm set Nurse Communication: Mobility status PT Visit Diagnosis: Unsteadiness on feet (R26.81);Other abnormalities of gait and mobility (R26.89);Muscle weakness (generalized) (M62.81);Difficulty in walking, not elsewhere classified (R26.2)     Time: [id number] PT Time Calculation (min) (ACUTE ONLY): 34 min  Charges:  $Gait Training: 8-22 mins $Therapeutic Exercise: 8-22 mins                     [place] Pager [phone] Office [phone] ]   Debe Coder 06/09/2019, 11:22 AM

## 2019-06-09 NOTE — Plan of Care (Signed)
  Problem: Skin Integrity: Goal: Will show signs of wound healing Outcome: Adequate for Discharge   Problem: Education: Goal: Knowledge of General Education information will improve Description: Including pain rating scale, medication(s)/side effects and non-pharmacologic comfort measures Outcome: Adequate for Discharge   Problem: Health Behavior/Discharge Planning: Goal: Ability to manage health-related needs will improve Outcome: Adequate for Discharge   Problem: Clinical Measurements: Goal: Ability to maintain clinical measurements within normal limits will improve Outcome: Adequate for Discharge Goal: Will remain free from infection Outcome: Adequate for Discharge Goal: Diagnostic test results will improve Outcome: Adequate for Discharge Goal: Respiratory complications will improve Outcome: Adequate for Discharge   Problem: Activity: Goal: Risk for activity intolerance will decrease Outcome: Adequate for Discharge   Problem: Coping: Goal: Level of anxiety will decrease Outcome: Adequate for Discharge   Problem: Elimination: Goal: Will not experience complications related to bowel motility Outcome: Adequate for Discharge   Problem: Pain Managment: Goal: General experience of comfort will improve Outcome: Adequate for Discharge   Problem: Safety: Goal: Ability to remain free from injury will improve Outcome: Adequate for Discharge   Problem: Skin Integrity: Goal: Risk for impaired skin integrity will decrease Outcome: Adequate for Discharge

## 2019-06-09 NOTE — Progress Notes (Signed)
Orthopedics Progress Note  Subjective: Stable overnight. Ready for discharge home  Objective:  Vitals:   06/08/19 2113 06/09/19 0630  BP: (!) 146/68 135/66  Pulse: 74 75  Resp: 16 16  Temp: 97.8 F (36.6 C) 97.9 F (36.6 C)  SpO2: 100% 100%    General: Awake and alert  Musculoskeletal: knee dressing CDI. No swelling. Neg Homans Neurovascularly intact  Lab Results  Component Value Date   WBC 11.5 (H) 06/09/2019   HGB 9.9 (L) 06/09/2019   HCT 31.7 (L) 06/09/2019   MCV 87.6 06/09/2019   PLT 243 06/09/2019       Component Value Date/Time   NA 137 06/09/2019 0242   K 5.3 (H) 06/09/2019 0242   CL 106 06/09/2019 0242   CO2 23 06/09/2019 0242   GLUCOSE 200 (H) 06/09/2019 0242   BUN 48 (H) 06/09/2019 0242   CREATININE 1.83 (H) 06/09/2019 0242   CALCIUM 8.7 (L) 06/09/2019 0242   GFRNONAA 27 (L) 06/09/2019 0242   GFRAA 31 (L) 06/09/2019 0242    No results found for: INR, PROTIME  Assessment/Plan: POD #2 s/p Procedure(s): TOTAL KNEE ARTHROPLASTY D/C home  Remo Lipps R. Veverly Fells, MD 06/09/2019 7:29 AM

## 2019-06-09 NOTE — TOC Initial Note (Signed)
Transition of Care [facility]) - Initial/Assessment Note    Patient Details  Name: Kayla Bell MRN: [id number] Date of Birth: 1945/12/06  Transition of Care [facility]) CM/SW Contact:    Joaquin Courts, RN Phone Number: 06/09/2019, 11:26 AM  Clinical Narrative:                 CM spoke with patient at bedside, patient plans to dc home with OPPT with an appointment on 06/11/19. Patient reports has rolling walker and 3-in-1 at home.  Expected Discharge Plan: Home/Self Care Barriers to Discharge: No Barriers Identified   Patient Goals and CMS Choice Patient states their goals for this hospitalization and ongoing recovery are:: to go home      Expected Discharge Plan and Services Expected Discharge Plan: Home/Self Care   Discharge Planning Services: CM Consult   Living arrangements for the past 2 months: Single Family Home Expected Discharge Date: 06/09/19               DME Arranged: N/A DME Agency: NA       HH Arranged: NA [place] Agency: NA        Prior Living Arrangements/Services Living arrangements for the past 2 months: Single Family Home Lives with:: Spouse Patient language and need for interpreter reviewed:: Yes Do you feel safe going back to the place where you live?: Yes      Need for Family Participation in Patient Care: Yes (Comment) Care giver support system in place?: Yes (comment)   Criminal Activity/Legal Involvement Pertinent to Current Situation/Hospitalization: No - Comment as needed  Activities of Daily Living Home Assistive Devices/Equipment: Eyeglasses, CBG Meter ADL Screening (condition at time of admission) Patient's cognitive ability adequate to safely complete daily activities?: Yes Is the patient deaf or have difficulty hearing?: No Does the patient have difficulty seeing, even when wearing glasses/contacts?: No Does the patient have difficulty concentrating, remembering, or making decisions?: No Patient able to express need for assistance  with ADLs?: Yes Does the patient have difficulty dressing or bathing?: No Independently performs ADLs?: Yes (appropriate for developmental age) Does the patient have difficulty walking or climbing stairs?: No Weakness of Legs: Right Weakness of Arms/Hands: None  Permission Sought/Granted                  Emotional Assessment Appearance:: Appears stated age Attitude/Demeanor/Rapport: Engaged Affect (typically observed): Accepting Orientation: : Oriented to Place, Oriented to  Time, Oriented to Situation, Oriented to Self   Psych Involvement: No (comment)  Admission diagnosis:  Right knee osteoarthritis Patient Active Problem List   Diagnosis Date Noted  . S/P right TKA 06/07/2019  . Status post total knee replacement, right 06/07/2019  . Chronic diastolic heart failure ([place]) 02/24/2017  . Cardiomyopathy [facility])    PCP:  Manon Hilding, MD Pharmacy:   [facility] [address], [place] [address] Phone: [phone] Fax: [phone]  [facility] [phone] - [place], [place] - [place] [address] [place] [address] Phone: [phone] Fax: [phone]     Social Determinants of Health (SDOH) Interventions    Readmission Risk Interventions No flowsheet data found.

## 2019-06-11 DIAGNOSIS — Z9889 Other specified postprocedural states: Secondary | ICD-10-CM | POA: Diagnosis not present

## 2019-06-11 DIAGNOSIS — M1711 Unilateral primary osteoarthritis, right knee: Secondary | ICD-10-CM | POA: Diagnosis not present

## 2019-06-11 DIAGNOSIS — R262 Difficulty in walking, not elsewhere classified: Secondary | ICD-10-CM | POA: Diagnosis not present

## 2019-06-11 NOTE — Discharge Summary (Signed)
Physician Discharge Summary  Patient ID: Kayla Bell MRN: [id number] DOB/AGE: Oct 21, 1945 73 y.o.  Admit date: 06/07/2019 Discharge date: 06/09/2019   Procedures:  Procedure(s) (LRB): TOTAL KNEE ARTHROPLASTY (Right)  Attending Physician:  Dr. Paralee Cancel   Admission Diagnoses:   Right knee primary OA / pain  Discharge Diagnoses:  Active Problems:   S/P right TKA   Status post total knee replacement, right  Past Medical History:  Diagnosis Date  . Anemia   . Anxiety   . Arthritis    hands,  . Cardiomyopathy ([place])   . Chronic kidney disease, stage 3 ([place])   . Depression   . Essential hypertension   . GERD (gastroesophageal reflux disease)   . Gout   . Hyperlipidemia   . Type 2 diabetes mellitus (HCC)     HPI:     Kayla Bell, 73 y.o. female, has a history of pain and functional disability in the right knee due to arthritis and has failed non-surgical conservative treatments for greater than 12 weeks to includeNSAID's and/or analgesics, corticosteriod injections and activity modification.  Onset of symptoms was gradual, starting 2-3 years ago with gradually worsening course since that time. The patient noted no past surgery on the right knee(s).  Patient currently rates pain in the right knee(s) at 10 out of 10 with activity. Patient has night pain, worsening of pain with activity and weight bearing, pain that interferes with activities of daily living, pain with passive range of motion, crepitus and joint swelling.  Patient has evidence of periarticular osteophytes and joint space narrowing by imaging studies.  There is no active infection.  Risks, benefits and expectations were discussed with the patient.  Risks including but not limited to the risk of anesthesia, blood clots, nerve damage, blood vessel damage, failure of the prosthesis, infection and up to and including death.  Patient understand the risks, benefits and expectations and wishes to proceed with  surgery.   PCP: Manon Hilding, MD   Discharged Condition: good  Hospital Course:  Patient underwent the above stated procedure on 06/07/2019. Patient tolerated the procedure well and brought to the recovery room in good condition and subsequently to the floor.  POD #1 BP: 135/66 ; Pulse: 71 ; Temp: 97.6 F (36.4 C) ; Resp: 16 Patient reports pain as moderate. Very tough night until about 5 am. No other events. Neurovascular intact and incision: dressing C/D/I.   LABS  Basename    HGB     10.5  HCT     34.7   POD #2  BP: 135/66 ; Pulse: 75 ; Temp: 97.9 F (36.6 C) ; Resp: 16 Stable overnight. Ready for discharge home. General: Awake and alert.  Musculoskeletal: knee dressing CDI. No swelling. Neg Homans.  Neurovascularly intact.  LABS  Basename    HGB     9.9  HCT     31.7    Discharge Exam: General appearance: alert, cooperative and no distress Extremities: Homans sign is negative, no sign of DVT, no edema, redness or tenderness in the calves or thighs and no ulcers, gangrene or trophic changes  Disposition:  Home with follow up in 2 weeks   Follow-up Information    Paralee Cancel, MD. Schedule an appointment as soon as possible for a visit in 2 weeks.   Specialty: Orthopedic Surgery Contact information: [address] [place] [address] [id number]           Discharge Instructions  Call MD / Call 911   Complete by: As directed    If you experience chest pain or shortness of breath, CALL 911 and be transported to the hospital emergency room.  If you develope a fever above 101 F, pus (white drainage) or increased drainage or redness at the wound, or calf pain, call your surgeon's office.   Call MD / Call 911   Complete by: As directed    If you experience chest pain or shortness of breath, CALL 911 and be transported to the hospital emergency room.  If you develope a fever above 101 F, pus (white drainage) or increased drainage or redness at  the wound, or calf pain, call your surgeon's office.   Change dressing   Complete by: As directed    Maintain surgical dressing until follow up in the clinic. If the edges start to pull up, may reinforce with tape. If the dressing is no longer working, may remove and cover with gauze and tape, but must keep the area dry and clean.  Call with any questions or concerns.   Constipation Prevention   Complete by: As directed    Drink plenty of fluids.  Prune juice may be helpful.  You may use a stool softener, such as Colace (over the counter) 100 mg twice a day.  Use MiraLax (over the counter) for constipation as needed.   Constipation Prevention   Complete by: As directed    Drink plenty of fluids.  Prune juice may be helpful.  You may use a stool softener, such as Colace (over the counter) 100 mg twice a day.  Use MiraLax (over the counter) for constipation as needed.   Diet - low sodium heart healthy   Complete by: As directed    Diet - low sodium heart healthy   Complete by: As directed    Discharge instructions   Complete by: As directed    Maintain surgical dressing until follow up in the clinic. If the edges start to pull up, may reinforce with tape. If the dressing is no longer working, may remove and cover with gauze and tape, but must keep the area dry and clean.  Follow up in 2 weeks at [facility]. Call with any questions or concerns.   Increase activity slowly as tolerated   Complete by: As directed    Weight bearing as tolerated with assist device (walker, cane, etc) as directed, use it as long as suggested by your surgeon or therapist, typically at least 4-6 weeks.   Increase activity slowly as tolerated   Complete by: As directed    TED hose   Complete by: As directed    Use stockings (TED hose) for 2 weeks on both leg(s).  You may remove them at night for sleeping.      Allergies as of 06/09/2019      Reactions   Penicillins Hives   Did it involve swelling of  the face/tongue/throat, SOB, or low BP? No Did it involve sudden or severe rash/hives, skin peeling, or any reaction on the inside of your mouth or nose? No Did you need to seek medical attention at a hospital or doctor's office? No When did it last happen?10-15 years If all above answers are "NO", may proceed with cephalosporin use.      Medication List    STOP taking these medications   acetaminophen 500 MG tablet Commonly known as: TYLENOL   aspirin 81 MG tablet Replaced by: aspirin 81 MG  chewable tablet   traMADol 50 MG tablet Commonly known as: ULTRAM     TAKE these medications   allopurinol 100 MG tablet Commonly known as: ZYLOPRIM Take 100 mg by mouth every evening.   amLODipine 5 MG tablet Commonly known as: NORVASC Take 5 mg by mouth daily.   aspirin 81 MG chewable tablet Commonly known as: Aspirin Childrens Chew 1 tablet (81 mg total) by mouth 2 (two) times daily. Take for 4 weeks, then resume regular dose. Replaces: aspirin 81 MG tablet   carvedilol 25 MG tablet Commonly known as: COREG Take 25 mg by mouth 2 (two) times daily with a meal.   clonazePAM 0.5 MG tablet Commonly known as: KLONOPIN Take 0.5 mg by mouth every 8 (eight) hours as needed for anxiety.   docusate sodium 100 MG capsule Commonly known as: Colace Take 1 capsule (100 mg total) by mouth 2 (two) times daily.   ferrous sulfate 325 (65 FE) MG tablet Commonly known as: FerrouSul Take 1 tablet (325 mg total) by mouth 3 (three) times daily with meals for 14 days.   fluticasone 50 MCG/ACT nasal spray Commonly known as: FLONASE Place 1 spray into both nostrils daily.   furosemide 20 MG tablet Commonly known as: LASIX Take 1 tablet (20 mg total) by mouth daily as needed (based on fluid and weight).   HYDROcodone-acetaminophen 7.5-325 MG tablet Commonly known as: Norco Take 1-2 tablets by mouth every 4 (four) hours as needed for moderate pain.   insulin glargine 100 UNIT/ML  injection Commonly known as: LANTUS Inject 40 Units into the skin daily at 6 PM.   lansoprazole 15 MG capsule Commonly known as: PREVACID Take 15 mg by mouth daily at 12 noon.   LUBRICATING EYE DROPS OP Place 1 drop into both eyes daily as needed (dry eyes).   methocarbamol 500 MG tablet Commonly known as: Robaxin Take 1 tablet (500 mg total) by mouth every 6 (six) hours as needed for muscle spasms.   polyethylene glycol 17 g packet Commonly known as: MIRALAX / GLYCOLAX Take 17 g by mouth 2 (two) times daily.   pravastatin 40 MG tablet Commonly known as: PRAVACHOL Take 40 mg by mouth at bedtime.   sacubitril-valsartan 49-51 MG Commonly known as: Entresto Take 1 tablet by mouth 2 (two) times daily. Patient has voucher for 1st 30 days free   Vitamin D (Cholecalciferol) 50 MCG (2000 UT) Caps Take 2,000 Units by mouth daily.            Discharge Care Instructions  (From admission, onward)         Start     Ordered   06/08/19 0000  Change dressing    Comments: Maintain surgical dressing until follow up in the clinic. If the edges start to pull up, may reinforce with tape. If the dressing is no longer working, may remove and cover with gauze and tape, but must keep the area dry and clean.  Call with any questions or concerns.   06/08/19 0813           Signed: [place]. Wade Asebedo   PA-C  06/11/2019, 9:04 PM

## 2019-06-13 DIAGNOSIS — M1711 Unilateral primary osteoarthritis, right knee: Secondary | ICD-10-CM | POA: Diagnosis not present

## 2019-06-13 DIAGNOSIS — Z9889 Other specified postprocedural states: Secondary | ICD-10-CM | POA: Diagnosis not present

## 2019-06-13 DIAGNOSIS — R262 Difficulty in walking, not elsewhere classified: Secondary | ICD-10-CM | POA: Diagnosis not present

## 2019-06-18 DIAGNOSIS — R262 Difficulty in walking, not elsewhere classified: Secondary | ICD-10-CM | POA: Diagnosis not present

## 2019-06-18 DIAGNOSIS — M1711 Unilateral primary osteoarthritis, right knee: Secondary | ICD-10-CM | POA: Diagnosis not present

## 2019-06-18 DIAGNOSIS — Z9889 Other specified postprocedural states: Secondary | ICD-10-CM | POA: Diagnosis not present

## 2019-06-18 NOTE — Addendum Note (Signed)
Addendum  created 06/18/19 0751 by Barnet Glasgow, MD   Clinical Note Signed, Intraprocedure Blocks edited

## 2019-06-21 DIAGNOSIS — M1711 Unilateral primary osteoarthritis, right knee: Secondary | ICD-10-CM | POA: Diagnosis not present

## 2019-06-21 DIAGNOSIS — R262 Difficulty in walking, not elsewhere classified: Secondary | ICD-10-CM | POA: Diagnosis not present

## 2019-06-21 DIAGNOSIS — Z9889 Other specified postprocedural states: Secondary | ICD-10-CM | POA: Diagnosis not present

## 2019-06-25 DIAGNOSIS — R262 Difficulty in walking, not elsewhere classified: Secondary | ICD-10-CM | POA: Diagnosis not present

## 2019-06-25 DIAGNOSIS — M1711 Unilateral primary osteoarthritis, right knee: Secondary | ICD-10-CM | POA: Diagnosis not present

## 2019-06-25 DIAGNOSIS — Z9889 Other specified postprocedural states: Secondary | ICD-10-CM | POA: Diagnosis not present

## 2019-06-28 DIAGNOSIS — M1711 Unilateral primary osteoarthritis, right knee: Secondary | ICD-10-CM | POA: Diagnosis not present

## 2019-06-28 DIAGNOSIS — Z9889 Other specified postprocedural states: Secondary | ICD-10-CM | POA: Diagnosis not present

## 2019-06-28 DIAGNOSIS — R262 Difficulty in walking, not elsewhere classified: Secondary | ICD-10-CM | POA: Diagnosis not present

## 2019-07-02 DIAGNOSIS — Z9889 Other specified postprocedural states: Secondary | ICD-10-CM | POA: Diagnosis not present

## 2019-07-02 DIAGNOSIS — M1711 Unilateral primary osteoarthritis, right knee: Secondary | ICD-10-CM | POA: Diagnosis not present

## 2019-07-02 DIAGNOSIS — R262 Difficulty in walking, not elsewhere classified: Secondary | ICD-10-CM | POA: Diagnosis not present

## 2019-07-05 DIAGNOSIS — R262 Difficulty in walking, not elsewhere classified: Secondary | ICD-10-CM | POA: Diagnosis not present

## 2019-07-05 DIAGNOSIS — Z9889 Other specified postprocedural states: Secondary | ICD-10-CM | POA: Diagnosis not present

## 2019-07-05 DIAGNOSIS — M1711 Unilateral primary osteoarthritis, right knee: Secondary | ICD-10-CM | POA: Diagnosis not present

## 2019-07-09 DIAGNOSIS — M1711 Unilateral primary osteoarthritis, right knee: Secondary | ICD-10-CM | POA: Diagnosis not present

## 2019-07-09 DIAGNOSIS — R262 Difficulty in walking, not elsewhere classified: Secondary | ICD-10-CM | POA: Diagnosis not present

## 2019-07-09 DIAGNOSIS — Z9889 Other specified postprocedural states: Secondary | ICD-10-CM | POA: Diagnosis not present

## 2019-07-12 DIAGNOSIS — R262 Difficulty in walking, not elsewhere classified: Secondary | ICD-10-CM | POA: Diagnosis not present

## 2019-07-12 DIAGNOSIS — M1711 Unilateral primary osteoarthritis, right knee: Secondary | ICD-10-CM | POA: Diagnosis not present

## 2019-07-12 DIAGNOSIS — Z9889 Other specified postprocedural states: Secondary | ICD-10-CM | POA: Diagnosis not present

## 2019-07-16 DIAGNOSIS — R262 Difficulty in walking, not elsewhere classified: Secondary | ICD-10-CM | POA: Diagnosis not present

## 2019-07-16 DIAGNOSIS — Z9889 Other specified postprocedural states: Secondary | ICD-10-CM | POA: Diagnosis not present

## 2019-07-16 DIAGNOSIS — M1711 Unilateral primary osteoarthritis, right knee: Secondary | ICD-10-CM | POA: Diagnosis not present

## 2019-07-19 DIAGNOSIS — R262 Difficulty in walking, not elsewhere classified: Secondary | ICD-10-CM | POA: Diagnosis not present

## 2019-07-19 DIAGNOSIS — M1711 Unilateral primary osteoarthritis, right knee: Secondary | ICD-10-CM | POA: Diagnosis not present

## 2019-07-19 DIAGNOSIS — Z9889 Other specified postprocedural states: Secondary | ICD-10-CM | POA: Diagnosis not present

## 2019-07-23 DIAGNOSIS — Z96651 Presence of right artificial knee joint: Secondary | ICD-10-CM | POA: Diagnosis not present

## 2019-07-23 DIAGNOSIS — Z471 Aftercare following joint replacement surgery: Secondary | ICD-10-CM | POA: Diagnosis not present

## 2019-07-24 DIAGNOSIS — R262 Difficulty in walking, not elsewhere classified: Secondary | ICD-10-CM | POA: Diagnosis not present

## 2019-07-24 DIAGNOSIS — M1711 Unilateral primary osteoarthritis, right knee: Secondary | ICD-10-CM | POA: Diagnosis not present

## 2019-07-24 DIAGNOSIS — Z9889 Other specified postprocedural states: Secondary | ICD-10-CM | POA: Diagnosis not present

## 2019-07-26 DIAGNOSIS — Z9889 Other specified postprocedural states: Secondary | ICD-10-CM | POA: Diagnosis not present

## 2019-07-26 DIAGNOSIS — M1711 Unilateral primary osteoarthritis, right knee: Secondary | ICD-10-CM | POA: Diagnosis not present

## 2019-07-26 DIAGNOSIS — R262 Difficulty in walking, not elsewhere classified: Secondary | ICD-10-CM | POA: Diagnosis not present

## 2019-07-30 DIAGNOSIS — R262 Difficulty in walking, not elsewhere classified: Secondary | ICD-10-CM | POA: Diagnosis not present

## 2019-07-30 DIAGNOSIS — Z9889 Other specified postprocedural states: Secondary | ICD-10-CM | POA: Diagnosis not present

## 2019-07-30 DIAGNOSIS — M1711 Unilateral primary osteoarthritis, right knee: Secondary | ICD-10-CM | POA: Diagnosis not present

## 2019-08-02 DIAGNOSIS — Z9889 Other specified postprocedural states: Secondary | ICD-10-CM | POA: Diagnosis not present

## 2019-08-02 DIAGNOSIS — R262 Difficulty in walking, not elsewhere classified: Secondary | ICD-10-CM | POA: Diagnosis not present

## 2019-08-02 DIAGNOSIS — M1711 Unilateral primary osteoarthritis, right knee: Secondary | ICD-10-CM | POA: Diagnosis not present

## 2019-08-06 ENCOUNTER — Telehealth: Payer: Self-pay | Admitting: Cardiology

## 2019-08-06 DIAGNOSIS — Z9889 Other specified postprocedural states: Secondary | ICD-10-CM | POA: Diagnosis not present

## 2019-08-06 DIAGNOSIS — M1711 Unilateral primary osteoarthritis, right knee: Secondary | ICD-10-CM | POA: Diagnosis not present

## 2019-08-06 DIAGNOSIS — R262 Difficulty in walking, not elsewhere classified: Secondary | ICD-10-CM | POA: Diagnosis not present

## 2019-08-06 NOTE — Telephone Encounter (Signed)
Advised that paperwork can be dropped off with proof of income and insurance information.. Advised to fill out all patient portion.

## 2019-08-06 NOTE — Telephone Encounter (Signed)
Patient called asking if she can come by and have someone fill out her [employer] paperwork

## 2019-08-08 DIAGNOSIS — M1711 Unilateral primary osteoarthritis, right knee: Secondary | ICD-10-CM | POA: Diagnosis not present

## 2019-08-08 DIAGNOSIS — Z9889 Other specified postprocedural states: Secondary | ICD-10-CM | POA: Diagnosis not present

## 2019-08-08 DIAGNOSIS — R262 Difficulty in walking, not elsewhere classified: Secondary | ICD-10-CM | POA: Diagnosis not present

## 2019-08-13 DIAGNOSIS — M1711 Unilateral primary osteoarthritis, right knee: Secondary | ICD-10-CM | POA: Diagnosis not present

## 2019-08-13 DIAGNOSIS — Z9889 Other specified postprocedural states: Secondary | ICD-10-CM | POA: Diagnosis not present

## 2019-08-13 DIAGNOSIS — R262 Difficulty in walking, not elsewhere classified: Secondary | ICD-10-CM | POA: Diagnosis not present

## 2019-08-16 DIAGNOSIS — R262 Difficulty in walking, not elsewhere classified: Secondary | ICD-10-CM | POA: Diagnosis not present

## 2019-08-16 DIAGNOSIS — M1711 Unilateral primary osteoarthritis, right knee: Secondary | ICD-10-CM | POA: Diagnosis not present

## 2019-08-16 DIAGNOSIS — Z9889 Other specified postprocedural states: Secondary | ICD-10-CM | POA: Diagnosis not present

## 2019-09-19 DIAGNOSIS — E1165 Type 2 diabetes mellitus with hyperglycemia: Secondary | ICD-10-CM | POA: Diagnosis not present

## 2019-09-19 DIAGNOSIS — I10 Essential (primary) hypertension: Secondary | ICD-10-CM | POA: Diagnosis not present

## 2019-09-19 DIAGNOSIS — E1122 Type 2 diabetes mellitus with diabetic chronic kidney disease: Secondary | ICD-10-CM | POA: Diagnosis not present

## 2019-09-19 DIAGNOSIS — N189 Chronic kidney disease, unspecified: Secondary | ICD-10-CM | POA: Diagnosis not present

## 2019-09-19 DIAGNOSIS — E782 Mixed hyperlipidemia: Secondary | ICD-10-CM | POA: Diagnosis not present

## 2019-09-20 ENCOUNTER — Telehealth: Payer: Self-pay | Admitting: *Deleted

## 2019-09-20 ENCOUNTER — Other Ambulatory Visit: Payer: Self-pay | Admitting: *Deleted

## 2019-09-20 MED ORDER — ENTRESTO 49-51 MG PO TABS: 1.0000 | ORAL_TABLET | Freq: Two times a day (BID) | ORAL | 0 refills | Status: DC

## 2019-09-20 MED ORDER — ENTRESTO 49-51 MG PO TABS: 1.0000 | ORAL_TABLET | Freq: Two times a day (BID) | ORAL | 3 refills | Status: DC

## 2019-09-20 NOTE — Telephone Encounter (Signed)
Patient advised that entresto PA application from November 2020 is unavailable and apology given Advised that we could do a new application Patient agrees and will come to the office today at 4:00 pm to complete new patient assistance application for entresto

## 2019-09-20 NOTE — Telephone Encounter (Signed)
-----   Message from Chanda Busing sent at 09/20/2019  8:54 AM EST ----- Regarding: ENTRESTO APPLICATION Patient called wanting to know if you can check on her application for Entresto.   Please call patient.

## 2019-09-26 DIAGNOSIS — E1122 Type 2 diabetes mellitus with diabetic chronic kidney disease: Secondary | ICD-10-CM | POA: Diagnosis not present

## 2019-09-26 DIAGNOSIS — D509 Iron deficiency anemia, unspecified: Secondary | ICD-10-CM | POA: Diagnosis not present

## 2019-09-26 DIAGNOSIS — I10 Essential (primary) hypertension: Secondary | ICD-10-CM | POA: Diagnosis not present

## 2019-09-26 DIAGNOSIS — I5022 Chronic systolic (congestive) heart failure: Secondary | ICD-10-CM | POA: Diagnosis not present

## 2019-09-26 DIAGNOSIS — E782 Mixed hyperlipidemia: Secondary | ICD-10-CM | POA: Diagnosis not present

## 2019-10-04 ENCOUNTER — Encounter: Payer: Self-pay | Admitting: *Deleted

## 2019-10-04 ENCOUNTER — Other Ambulatory Visit: Payer: Self-pay | Admitting: *Deleted

## 2019-10-04 MED ORDER — ENTRESTO 49-51 MG PO TABS: 1.0000 | ORAL_TABLET | Freq: Two times a day (BID) | ORAL | 3 refills | Status: DC

## 2019-10-04 NOTE — Telephone Encounter (Signed)
This encounter was created in error - please disregard.

## 2019-10-12 DIAGNOSIS — I10 Essential (primary) hypertension: Secondary | ICD-10-CM | POA: Diagnosis not present

## 2019-10-12 DIAGNOSIS — E1165 Type 2 diabetes mellitus with hyperglycemia: Secondary | ICD-10-CM | POA: Diagnosis not present

## 2019-10-12 DIAGNOSIS — E7849 Other hyperlipidemia: Secondary | ICD-10-CM | POA: Diagnosis not present

## 2019-10-17 ENCOUNTER — Telehealth: Payer: Self-pay | Admitting: Cardiology

## 2019-10-17 MED ORDER — ENTRESTO 49-51 MG PO TABS: 1.0000 | ORAL_TABLET | Freq: Two times a day (BID) | ORAL | 0 refills | Status: DC

## 2019-10-17 NOTE — Telephone Encounter (Signed)
Requesting entresto samples due to low supply at home and NPAF says it will take a few weeks to get medication to her. Samples provided. Patient aware.

## 2019-10-17 NOTE — Telephone Encounter (Signed)
Kayla Bell has several questions about medication

## 2019-10-25 ENCOUNTER — Ambulatory Visit: Payer: Medicare Other | Attending: Internal Medicine

## 2019-10-25 ENCOUNTER — Other Ambulatory Visit: Payer: Self-pay

## 2019-10-25 DIAGNOSIS — Z23 Encounter for immunization: Secondary | ICD-10-CM

## 2019-10-25 NOTE — Progress Notes (Signed)
   Covid-19 Vaccination Clinic  Name:  Kayla Bell    MRN: [id number] DOB: [birth year]  10/25/2019  [name] was observed post Covid-19 immunization for 15 minutes without incidence. She was provided with Vaccine Information Sheet and instruction to access the V-Safe system.   [name] was instructed to call 911 with any severe reactions post vaccine: Marland Kitchen Difficulty breathing  . Swelling of your face and throat  . A fast heartbeat  . A bad rash all over your body  . Dizziness and weakness    Immunizations Administered    Name Date Dose VIS Date Route   Moderna COVID-19 Vaccine 10/25/2019  1:31 PM 0.5 mL 08/14/2019 Intramuscular   Manufacturer: Moderna   Lot: [id number]   [id number]

## 2019-11-09 DIAGNOSIS — E7849 Other hyperlipidemia: Secondary | ICD-10-CM | POA: Diagnosis not present

## 2019-11-09 DIAGNOSIS — I10 Essential (primary) hypertension: Secondary | ICD-10-CM | POA: Diagnosis not present

## 2019-11-20 DIAGNOSIS — E11649 Type 2 diabetes mellitus with hypoglycemia without coma: Secondary | ICD-10-CM | POA: Diagnosis not present

## 2019-11-20 DIAGNOSIS — I10 Essential (primary) hypertension: Secondary | ICD-10-CM | POA: Diagnosis not present

## 2019-11-20 DIAGNOSIS — M1A9XX1 Chronic gout, unspecified, with tophus (tophi): Secondary | ICD-10-CM | POA: Diagnosis not present

## 2019-11-20 DIAGNOSIS — E782 Mixed hyperlipidemia: Secondary | ICD-10-CM | POA: Diagnosis not present

## 2019-11-20 DIAGNOSIS — E1165 Type 2 diabetes mellitus with hyperglycemia: Secondary | ICD-10-CM | POA: Diagnosis not present

## 2019-11-20 DIAGNOSIS — E1122 Type 2 diabetes mellitus with diabetic chronic kidney disease: Secondary | ICD-10-CM | POA: Diagnosis not present

## 2019-11-26 ENCOUNTER — Ambulatory Visit: Payer: Medicare Other | Attending: Internal Medicine

## 2019-11-26 DIAGNOSIS — Z23 Encounter for immunization: Secondary | ICD-10-CM

## 2019-11-26 NOTE — Progress Notes (Signed)
   Covid-19 Vaccination Clinic  Name:  Kayla Bell    MRN: [id number] DOB: Apr 04, 1946  11/26/2019  [name] was observed post Covid-19 immunization for 15 minutes without incident. She was provided with Vaccine Information Sheet and instruction to access the V-Safe system.   [name] was instructed to call 911 with any severe reactions post vaccine: Marland Kitchen Difficulty breathing  . Swelling of face and throat  . A fast heartbeat  . A bad rash all over body  . Dizziness and weakness   Immunizations Administered    Name Date Dose VIS Date Route   Moderna COVID-19 Vaccine 11/26/2019  9:31 AM 0.5 mL 08/14/2019 Intramuscular   Manufacturer: Moderna   Lot: [id number]   [place]: [id number]

## 2019-12-17 DIAGNOSIS — M1990 Unspecified osteoarthritis, unspecified site: Secondary | ICD-10-CM | POA: Diagnosis not present

## 2019-12-17 DIAGNOSIS — M25572 Pain in left ankle and joints of left foot: Secondary | ICD-10-CM | POA: Diagnosis not present

## 2019-12-17 DIAGNOSIS — M109 Gout, unspecified: Secondary | ICD-10-CM | POA: Diagnosis not present

## 2019-12-17 DIAGNOSIS — N183 Chronic kidney disease, stage 3 unspecified: Secondary | ICD-10-CM | POA: Diagnosis not present

## 2019-12-17 DIAGNOSIS — M1A9XX1 Chronic gout, unspecified, with tophus (tophi): Secondary | ICD-10-CM | POA: Diagnosis not present

## 2019-12-17 NOTE — Progress Notes (Addendum)
Cardiology Office Note  Date: 12/18/2019   ID: DESHONDA CRYDERMAN, DOB [birth year], MRN [id number]  PCP:  Manon Hilding, MD  Cardiologist:  Rozann Lesches, MD Electrophysiologist:  None   Chief Complaint: NICM, MR   History of Present Illness: Kayla Bell is a 74 y.o. female with a history of a NICM, MR,chronic systolic heart failure, CKD stage III.    Last saw Dr. Domenic Polite May 18, 2019.  She was here for cardiac clearance r/t pending right total knee arthroplasty.  Patient was experiencing stable NYHA class II dyspnea.  She was tolerating her medications at the time.  Her weight had been stable without orthopnea or PND.  Patient states she has been doing well from a cardiac standpoint.  She denies any progressive anginal or exertional symptoms.  States she has been feeling better on day Entresto.  He is status post right total knee arthroplasty and continues to have some mild pain in her right knee joint.  She does have some mild lower extremity edema.  She is taking amlodipine 5 mg daily.  This may contribute to some of the swelling.  States she has not used Lasix in quite some time.  She is maintaining her weight around 170.  He states he recently had some lab work at Dr. Edythe Lynn office.  She sees rheumatology occasionally for her gout.  She states she recently received some type of infusion by rheumatology to counteract the gout and has not had any flareups since that time.   Past Medical History:  Diagnosis Date  . Anemia   . Anxiety   . Arthritis    hands,  . Cardiomyopathy ([place])   . Chronic kidney disease, stage 3   . Depression   . Essential hypertension   . GERD (gastroesophageal reflux disease)   . Gout   . Hyperlipidemia   . Type 2 diabetes mellitus ([place])     Past Surgical History:  Procedure Laterality Date  . BREAST SURGERY Left   . CHOLECYSTECTOMY    . EYE SURGERY Bilateral   . PARTIAL HYSTERECTOMY    . RIGHT ARM SURGERY Right   . TOTAL KNEE  ARTHROPLASTY Right 06/07/2019   Procedure: TOTAL KNEE ARTHROPLASTY;  Surgeon: Paralee Cancel, MD;  Location: [facility];  Service: Orthopedics;  Laterality: Right;  70 mins    Current Outpatient Medications  Medication Sig Dispense Refill  . allopurinol (ZYLOPRIM) 100 MG tablet Take 150 mg by mouth every evening.     Marland Kitchen amLODipine (NORVASC) 5 MG tablet Take 5 mg by mouth daily.     . carvedilol (COREG) 25 MG tablet Take 25 mg by mouth 2 (two) times daily with a meal.    . clonazePAM (KLONOPIN) 0.5 MG tablet Take 0.5 mg by mouth every 8 (eight) hours as needed for anxiety.     . fluticasone (FLONASE) 50 MCG/ACT nasal spray Place 1 spray into both nostrils daily.    . furosemide (LASIX) 20 MG tablet Take 1 tablet (20 mg total) by mouth daily as needed (based on fluid and weight). 30 tablet 1  . insulin glargine (LANTUS) 100 UNIT/ML injection Inject 45 Units into the skin daily at 6 PM.     . lansoprazole (PREVACID) 15 MG capsule Take 15 mg by mouth daily at 12 noon.    . pravastatin (PRAVACHOL) 40 MG tablet Take 40 mg by mouth at bedtime.     . sacubitril-valsartan (ENTRESTO) 49-51 MG Take 1 tablet by mouth 2 (  two) times daily. 28 tablet 0   No current facility-administered medications for this visit.   Allergies:  Penicillins   Social History: The patient  reports that she has never smoked. She has never used smokeless tobacco. She reports that she does not drink alcohol or use drugs.   Family History: The patient's family history includes Heart disease in her brother, sister, and sister; Heart failure in her son; Kidney disease in her sister.   ROS:  Please see the history of present illness. Otherwise, complete review of systems is positive for none.  All other systems are reviewed and negative.   Physical Exam: VS:  BP 124/64   Pulse 68   Ht 5\' 7"  (1.702 m)   Wt 170 lb 3.2 oz (77.2 kg)   SpO2 98%   BMI 26.66 kg/m , BMI Body mass index is 26.66 kg/m.  Wt Readings from Last 3  Encounters:  12/18/19 170 lb 3.2 oz (77.2 kg)  06/07/19 171 lb 3 oz (77.7 kg)  06/01/19 171 lb 3 oz (77.7 kg)    General: Patient appears comfortable at rest. Neck: Supple, no elevated JVP or carotid bruits, no thyromegaly. Lungs: Clear to auscultation, nonlabored breathing at rest. Cardiac: Regular rate and rhythm, no S3 or significant systolic murmur, no pericardial rub. Extremities: Mild non-pitting edema bilaterally, distal pulses 2+. Skin: Warm and dry. Musculoskeletal: No kyphosis. Neuropsychiatric: Alert and oriented x3, affect grossly appropriate.  ECG: None today   Recent Labwork: 06/09/2019: BUN 48; Creatinine, Ser 1.83; Hemoglobin 9.9; Platelets 243; Potassium 5.3; Sodium 137  No results found for: CHOL, TRIG, HDL, CHOLHDL, VLDL, LDLCALC, LDLDIRECT  Other Studies Reviewed Today:  Echocardiogram 09/28/2018: Study Conclusions  - Left ventricle: The cavity size was normal. Wall thickness was normal. Systolic function was mildly to moderately reduced. The estimated ejection fraction was in the range of 40% to 45%. Diastolic function is indeterminant. Diffuse hypokinesis. - Aortic valve: Mildly calcified annulus. Trileaflet; mildly thickened leaflets. Valve area (VTI): 1.81 cm^2. Valve area (Vmax): 1.73 cm^2. Valve area (Vmean): 1.77 cm^2. - Mitral valve: Mildly calcified annulus. Mildly thickened leaflets . There was mild regurgitation. The MR vena contracta is 0.2 cm. Valve area by continuity equation (using LVOT flow): 1.72 cm^2. - Left atrium: The atrium was mildly to moderately dilated. - Atrial septum: No defect or patent foramen ovale was identified.  Assessment and Plan:  1. Chronic systolic heart failure (HCC)   2. Cardiomyopathy, unspecified type ([place])   3. Mitral valve insufficiency, unspecified etiology   4. Stage 3b chronic kidney disease    1. Chronic systolic heart failure Patient states since she started taking the Entresto she is  feeling much better.  She is currently taking 49/51 mg Entresto p.o. twice daily.  Delene Loll was started on 11/09/2019.  She is currently not on spironolactone and is taking Lasix as needed.  She states she has not taken any as needed Lasix recently.  She does have some mild lower extremity edema.  Some of this edema could be attributed to amlodipine use.  She is tolerating Entresto well.  Continue Entresto at current dosage.  Use Lasix as needed for swelling or weight gain.  She is maintaining her weight around 170.  2. Cardiomyopathy, unspecified type ([place]) Last echocardiogram performed 09/28/2018 showed EF of 40 to 45% with some mild MR.  This was an increase in EF from previous echo in 2019 from 35 to 40% EF.  Will likely need to repeat echocardiogram after next follow-up  when she has been on Entresto for a while.  . 3. Mitral valve insufficiency, unspecified etiology On last echo on 09/28/2018 she had mild MR.  We will monitor with subsequent echocardiograms  4. Stage 3b chronic kidney disease Most recent renal function on 06/09/2019 showed creatinine 1.83, GFR 31, BUN 48.  Patient has not taking Aldactone.  Lasix had been changed to as needed.  Patient states she recently had some lab work at Dr. Edythe Lynn office and was told by Dr. Quintin Alto her renal function had improved.  I have asked the nurse to obtain those results.  Medication Adjustments/Labs and Tests Ordered: Current medicines are reviewed at length with the patient today.  Concerns regarding medicines are outlined above.   Disposition: Follow-up with Dr. Domenic Polite or APP 6 months  Signed, Levell July, NP 12/18/2019 1:06 PM    [facility] at [place], [place],  [address] Phone: [phone]; Fax: [phone]

## 2019-12-18 ENCOUNTER — Other Ambulatory Visit: Payer: Self-pay

## 2019-12-18 ENCOUNTER — Encounter: Payer: Self-pay | Admitting: Family Medicine

## 2019-12-18 ENCOUNTER — Ambulatory Visit (INDEPENDENT_AMBULATORY_CARE_PROVIDER_SITE_OTHER): Payer: Medicare Other | Admitting: Family Medicine

## 2019-12-18 ENCOUNTER — Encounter: Payer: Self-pay | Admitting: *Deleted

## 2019-12-18 VITALS — BP 124/64 | HR 68 | Ht 67.0 in | Wt 170.2 lb

## 2019-12-18 DIAGNOSIS — I429 Cardiomyopathy, unspecified: Secondary | ICD-10-CM | POA: Diagnosis not present

## 2019-12-18 DIAGNOSIS — I5022 Chronic systolic (congestive) heart failure: Secondary | ICD-10-CM

## 2019-12-18 DIAGNOSIS — N1832 Chronic kidney disease, stage 3b: Secondary | ICD-10-CM

## 2019-12-18 DIAGNOSIS — I340 Nonrheumatic mitral (valve) insufficiency: Secondary | ICD-10-CM | POA: Diagnosis not present

## 2019-12-18 NOTE — Patient Instructions (Addendum)
Medication Instructions:   Your physician recommends that you continue on your current medications as directed. Please refer to the Current Medication list given to you today.  Labwork:  NONE  Testing/Procedures:  NONE  Follow-Up:  Your physician recommends that you schedule a follow-up appointment in: 6 months (office). You will receive a reminder letter in the mail in about months reminding you to call and schedule your appointment. If you don't receive this letter, please contact our office.  Any Other Special Instructions Will Be Listed Below (If Applicable).  If you need a refill on your cardiac medications before your next appointment, please call your pharmacy.

## 2020-01-11 DIAGNOSIS — E7849 Other hyperlipidemia: Secondary | ICD-10-CM | POA: Diagnosis not present

## 2020-01-11 DIAGNOSIS — I10 Essential (primary) hypertension: Secondary | ICD-10-CM | POA: Diagnosis not present

## 2020-01-23 DIAGNOSIS — E1165 Type 2 diabetes mellitus with hyperglycemia: Secondary | ICD-10-CM | POA: Diagnosis not present

## 2020-01-23 DIAGNOSIS — I5022 Chronic systolic (congestive) heart failure: Secondary | ICD-10-CM | POA: Diagnosis not present

## 2020-01-23 DIAGNOSIS — I10 Essential (primary) hypertension: Secondary | ICD-10-CM | POA: Diagnosis not present

## 2020-01-23 DIAGNOSIS — E782 Mixed hyperlipidemia: Secondary | ICD-10-CM | POA: Diagnosis not present

## 2020-01-23 DIAGNOSIS — E1122 Type 2 diabetes mellitus with diabetic chronic kidney disease: Secondary | ICD-10-CM | POA: Diagnosis not present

## 2020-02-01 DIAGNOSIS — E1122 Type 2 diabetes mellitus with diabetic chronic kidney disease: Secondary | ICD-10-CM | POA: Diagnosis not present

## 2020-02-01 DIAGNOSIS — I10 Essential (primary) hypertension: Secondary | ICD-10-CM | POA: Diagnosis not present

## 2020-02-01 DIAGNOSIS — I5022 Chronic systolic (congestive) heart failure: Secondary | ICD-10-CM | POA: Diagnosis not present

## 2020-02-01 DIAGNOSIS — M109 Gout, unspecified: Secondary | ICD-10-CM | POA: Diagnosis not present

## 2020-02-01 DIAGNOSIS — D509 Iron deficiency anemia, unspecified: Secondary | ICD-10-CM | POA: Diagnosis not present

## 2020-04-30 DIAGNOSIS — N3001 Acute cystitis with hematuria: Secondary | ICD-10-CM | POA: Diagnosis not present

## 2020-05-13 DIAGNOSIS — E1122 Type 2 diabetes mellitus with diabetic chronic kidney disease: Secondary | ICD-10-CM | POA: Diagnosis not present

## 2020-05-13 DIAGNOSIS — I130 Hypertensive heart and chronic kidney disease with heart failure and stage 1 through stage 4 chronic kidney disease, or unspecified chronic kidney disease: Secondary | ICD-10-CM | POA: Diagnosis not present

## 2020-05-13 DIAGNOSIS — I5022 Chronic systolic (congestive) heart failure: Secondary | ICD-10-CM | POA: Diagnosis not present

## 2020-05-13 DIAGNOSIS — N183 Chronic kidney disease, stage 3 unspecified: Secondary | ICD-10-CM | POA: Diagnosis not present

## 2020-06-12 DIAGNOSIS — I130 Hypertensive heart and chronic kidney disease with heart failure and stage 1 through stage 4 chronic kidney disease, or unspecified chronic kidney disease: Secondary | ICD-10-CM | POA: Diagnosis not present

## 2020-06-12 DIAGNOSIS — I5022 Chronic systolic (congestive) heart failure: Secondary | ICD-10-CM | POA: Diagnosis not present

## 2020-06-12 DIAGNOSIS — N183 Chronic kidney disease, stage 3 unspecified: Secondary | ICD-10-CM | POA: Diagnosis not present

## 2020-06-12 DIAGNOSIS — R300 Dysuria: Secondary | ICD-10-CM | POA: Diagnosis not present

## 2020-06-12 DIAGNOSIS — Z7689 Persons encountering health services in other specified circumstances: Secondary | ICD-10-CM | POA: Diagnosis not present

## 2020-06-12 DIAGNOSIS — E1122 Type 2 diabetes mellitus with diabetic chronic kidney disease: Secondary | ICD-10-CM | POA: Diagnosis not present

## 2020-06-20 ENCOUNTER — Ambulatory Visit: Payer: Medicare Other | Admitting: Cardiology

## 2020-06-23 DIAGNOSIS — E782 Mixed hyperlipidemia: Secondary | ICD-10-CM | POA: Diagnosis not present

## 2020-06-23 DIAGNOSIS — D529 Folate deficiency anemia, unspecified: Secondary | ICD-10-CM | POA: Diagnosis not present

## 2020-06-23 DIAGNOSIS — N183 Chronic kidney disease, stage 3 unspecified: Secondary | ICD-10-CM | POA: Diagnosis not present

## 2020-06-23 DIAGNOSIS — E1122 Type 2 diabetes mellitus with diabetic chronic kidney disease: Secondary | ICD-10-CM | POA: Diagnosis not present

## 2020-06-23 DIAGNOSIS — E875 Hyperkalemia: Secondary | ICD-10-CM | POA: Diagnosis not present

## 2020-06-23 DIAGNOSIS — I10 Essential (primary) hypertension: Secondary | ICD-10-CM | POA: Diagnosis not present

## 2020-06-25 DIAGNOSIS — N183 Chronic kidney disease, stage 3 unspecified: Secondary | ICD-10-CM | POA: Diagnosis not present

## 2020-06-25 DIAGNOSIS — I119 Hypertensive heart disease without heart failure: Secondary | ICD-10-CM | POA: Diagnosis not present

## 2020-06-25 DIAGNOSIS — I10 Essential (primary) hypertension: Secondary | ICD-10-CM | POA: Diagnosis not present

## 2020-06-25 DIAGNOSIS — Z23 Encounter for immunization: Secondary | ICD-10-CM | POA: Diagnosis not present

## 2020-06-25 DIAGNOSIS — I5022 Chronic systolic (congestive) heart failure: Secondary | ICD-10-CM | POA: Diagnosis not present

## 2020-06-25 DIAGNOSIS — D509 Iron deficiency anemia, unspecified: Secondary | ICD-10-CM | POA: Diagnosis not present

## 2020-06-25 DIAGNOSIS — Z0001 Encounter for general adult medical examination with abnormal findings: Secondary | ICD-10-CM | POA: Diagnosis not present

## 2020-06-25 DIAGNOSIS — N3000 Acute cystitis without hematuria: Secondary | ICD-10-CM | POA: Diagnosis not present

## 2020-06-25 DIAGNOSIS — E1122 Type 2 diabetes mellitus with diabetic chronic kidney disease: Secondary | ICD-10-CM | POA: Diagnosis not present

## 2020-07-24 DIAGNOSIS — M25572 Pain in left ankle and joints of left foot: Secondary | ICD-10-CM | POA: Diagnosis not present

## 2020-07-24 DIAGNOSIS — M1A9XX1 Chronic gout, unspecified, with tophus (tophi): Secondary | ICD-10-CM | POA: Diagnosis not present

## 2020-07-24 DIAGNOSIS — N183 Chronic kidney disease, stage 3 unspecified: Secondary | ICD-10-CM | POA: Diagnosis not present

## 2020-07-24 DIAGNOSIS — M109 Gout, unspecified: Secondary | ICD-10-CM | POA: Diagnosis not present

## 2020-07-24 DIAGNOSIS — M1990 Unspecified osteoarthritis, unspecified site: Secondary | ICD-10-CM | POA: Diagnosis not present

## 2020-07-28 DIAGNOSIS — M1A9XX1 Chronic gout, unspecified, with tophus (tophi): Secondary | ICD-10-CM | POA: Diagnosis not present

## 2020-07-28 DIAGNOSIS — E875 Hyperkalemia: Secondary | ICD-10-CM | POA: Diagnosis not present

## 2020-07-30 ENCOUNTER — Encounter: Payer: Self-pay | Admitting: Cardiology

## 2020-07-30 NOTE — Progress Notes (Signed)
Cardiology Office Note  Date: 07/31/2020   ID: Kayla Bell, DOB 07/27/1946, MRN [id number]  PCP:  Manon Hilding, MD  Cardiologist:  Rozann Lesches, MD Electrophysiologist:  None   Chief Complaint  Patient presents with  . Cardiac follow-up    History of Present Illness: Kayla Bell is a 74 y.o. female last seen in April by Mr. Leonides Sake NP.  She presents for a routine visit.  Overall, reports no increasing shortness of breath with activity, no exertional chest pain or palpitations.  We went over her medications which are stable from a cardiac perspective and outlined below.  She reports compliance and no obvious intolerances.  I personally reviewed her ECG today which shows normal sinus rhythm.  We discussed obtaining a follow-up echocardiogram.  LVEF was 40 to 45% by echocardiography last year.  Past Medical History:  Diagnosis Date  . Anemia   . Anxiety   . Arthritis   . Cardiomyopathy ([place])   . Chronic kidney disease, stage 3 ([place])   . Depression   . Essential hypertension   . GERD (gastroesophageal reflux disease)   . Gout   . Hyperlipidemia   . Type 2 diabetes mellitus ([place])     Past Surgical History:  Procedure Laterality Date  . BREAST SURGERY Left   . CHOLECYSTECTOMY    . EYE SURGERY Bilateral   . PARTIAL HYSTERECTOMY    . RIGHT ARM SURGERY Right   . TOTAL KNEE ARTHROPLASTY Right 06/07/2019   Procedure: TOTAL KNEE ARTHROPLASTY;  Surgeon: Paralee Cancel, MD;  [facility]: [facility];  Service: Orthopedics;  Laterality: Right;  70 mins    Current Outpatient Medications  Medication Sig Dispense Refill  . allopurinol (ZYLOPRIM) 100 MG tablet Take 150 mg by mouth every evening.     Marland Kitchen amLODipine (NORVASC) 5 MG tablet Take 5 mg by mouth daily.     . carvedilol (COREG) 25 MG tablet Take 25 mg by mouth 2 (two) times daily with a meal.    . clonazePAM (KLONOPIN) 0.5 MG tablet Take 0.5 mg by mouth every 8 (eight) hours as needed for anxiety.     .  fluticasone (FLONASE) 50 MCG/ACT nasal spray Place 1 spray into both nostrils daily.    . furosemide (LASIX) 20 MG tablet Take 1 tablet (20 mg total) by mouth daily as needed (based on fluid and weight). 30 tablet 1  . insulin glargine (LANTUS) 100 UNIT/ML injection Inject 45 Units into the skin daily at 6 PM.     . lansoprazole (PREVACID) 15 MG capsule Take 15 mg by mouth daily at 12 noon.    . pravastatin (PRAVACHOL) 40 MG tablet Take 40 mg by mouth at bedtime.     . sacubitril-valsartan (ENTRESTO) 49-51 MG Take 1 tablet by mouth 2 (two) times daily. 28 tablet 0   No current facility-administered medications for this visit.   Allergies:  Penicillins   ROS: No syncope.  Physical Exam: VS:  BP 128/68   Pulse 71   Ht 5\' 6"  (1.676 m)   Wt 170 lb (77.1 kg)   SpO2 97%   BMI 27.44 kg/m , BMI Body mass index is 27.44 kg/m.  Wt Readings from Last 3 Encounters:  07/31/20 170 lb (77.1 kg)  12/18/19 170 lb 3.2 oz (77.2 kg)  06/07/19 171 lb 3 oz (77.7 kg)    General: Patient appears comfortable at rest. HEENT: Conjunctiva and lids normal, wearing a mask. Neck: Supple, no elevated JVP or  carotid bruits, no thyromegaly. Lungs: Clear to auscultation, nonlabored breathing at rest. Cardiac: Regular rate and rhythm, no S3, soft systolic murmur, no pericardial rub. Abdomen: Soft, nontender, bowel sounds present. Extremities: No pitting edema.  ECG:  An ECG dated 05/18/2019 was personally reviewed today and demonstrated:  Sinus rhythm with PVC.  Recent Labwork:  No interval lab work for review today.  Other Studies Reviewed Today:  Echocardiogram 09/28/2018: - Left ventricle: The cavity size was normal. Wall thickness was  normal. Systolic function was mildly to moderately reduced. The  estimated ejection fraction was in the range of 40% to 45%.  Diastolic function is indeterminant. Diffuse hypokinesis.  - Aortic valve: Mildly calcified annulus. Trileaflet; mildly  thickened  leaflets. Valve area (VTI): 1.81 cm^2. Valve area  (Vmax): 1.73 cm^2. Valve area (Vmean): 1.77 cm^2.  - Mitral valve: Mildly calcified annulus. Mildly thickened leaflets  . There was mild regurgitation. The MR vena contracta is 0.2 cm.  Valve area by continuity equation (using LVOT flow): 1.72 cm^2.  - Left atrium: The atrium was mildly to moderately dilated.  - Atrial septum: No defect or patent foramen ovale was identified.   Assessment and Plan:  1.  Nonischemic cardiomyopathy, LVEF 40 to 45% by echocardiogram last year.  She is clinically stable at this time with no evidence of fluid overload.  Continue treatment with Coreg, Entresto, and Lasix.  Follow-up echocardiogram will be obtained.  2.  Essential hypertension, she is also on Norvasc.  Blood pressure is adequately controlled today.  3.  Mitral regurgitation, mild by echocardiogram last year.  4.  CKD stage IIIb, she continues to follow Dr. Quintin Alto.  Medication Adjustments/Labs and Tests Ordered: Current medicines are reviewed at length with the patient today.  Concerns regarding medicines are outlined above.   Tests Ordered: Orders Placed This Encounter  Procedures  . EKG 12-Lead  . ECHOCARDIOGRAM COMPLETE    Medication Changes: No orders of the defined types were placed in this encounter.   Disposition:  Follow up 6 months in the [place] office.  Signed, Satira Sark, MD, [facility] 07/31/2020 10:07 AM    [facility] at [place], [place], [place] [address] Phone: [phone]; Fax: [phone]

## 2020-07-31 ENCOUNTER — Ambulatory Visit: Payer: Medicare Other | Admitting: Cardiology

## 2020-07-31 ENCOUNTER — Encounter: Payer: Self-pay | Admitting: Cardiology

## 2020-07-31 ENCOUNTER — Other Ambulatory Visit: Payer: Self-pay | Admitting: *Deleted

## 2020-07-31 VITALS — BP 128/68 | HR 71 | Ht 66.0 in | Wt 170.0 lb

## 2020-07-31 DIAGNOSIS — I429 Cardiomyopathy, unspecified: Secondary | ICD-10-CM

## 2020-07-31 DIAGNOSIS — N1832 Chronic kidney disease, stage 3b: Secondary | ICD-10-CM | POA: Diagnosis not present

## 2020-07-31 DIAGNOSIS — I10 Essential (primary) hypertension: Secondary | ICD-10-CM | POA: Diagnosis not present

## 2020-07-31 MED ORDER — ENTRESTO 49-51 MG PO TABS: 1.0000 | ORAL_TABLET | Freq: Two times a day (BID) | ORAL | 0 refills | Status: DC

## 2020-07-31 NOTE — Patient Instructions (Addendum)
Medication Instructions:    Your physician recommends that you continue on your current medications as directed. Please refer to the Current Medication list given to you today.  Labwork:  None  Testing/Procedures: Your physician has requested that you have an echocardiogram. Echocardiography is a painless test that uses sound waves to create images of your heart. It provides your doctor with information about the size and shape of your heart and how well your heart's chambers and valves are working. This procedure takes approximately one hour. There are no restrictions for this procedure.  Follow-Up:  Your physician recommends that you schedule a follow-up appointment in: 6 months.  Any Other Special Instructions Will Be Listed Below (If Applicable).  If you need a refill on your cardiac medications before your next appointment, please call your pharmacy. 

## 2020-08-04 ENCOUNTER — Telehealth: Payer: Self-pay | Admitting: Cardiology

## 2020-08-04 NOTE — Telephone Encounter (Signed)
Patient called stating that she has not received (ENTRESTO) 49-51 MG  States that she has run out of her medication.

## 2020-08-04 NOTE — Telephone Encounter (Signed)
Reports she received her entresto this morning after she called office.

## 2020-08-05 ENCOUNTER — Other Ambulatory Visit: Payer: Self-pay | Admitting: *Deleted

## 2020-08-05 MED ORDER — ENTRESTO 49-51 MG PO TABS: 1.0000 | ORAL_TABLET | Freq: Two times a day (BID) | ORAL | 0 refills | Status: DC

## 2020-08-12 DIAGNOSIS — N3000 Acute cystitis without hematuria: Secondary | ICD-10-CM | POA: Diagnosis not present

## 2020-08-28 ENCOUNTER — Other Ambulatory Visit: Payer: Medicare Other

## 2020-09-01 ENCOUNTER — Other Ambulatory Visit: Payer: Self-pay | Admitting: *Deleted

## 2020-09-01 MED ORDER — ENTRESTO 49-51 MG PO TABS: 1.0000 | ORAL_TABLET | Freq: Two times a day (BID) | ORAL | 3 refills | Status: DC

## 2020-09-03 DIAGNOSIS — H2513 Age-related nuclear cataract, bilateral: Secondary | ICD-10-CM | POA: Diagnosis not present

## 2020-09-03 DIAGNOSIS — H40033 Anatomical narrow angle, bilateral: Secondary | ICD-10-CM | POA: Diagnosis not present

## 2020-09-18 ENCOUNTER — Other Ambulatory Visit: Payer: Medicare Other

## 2020-09-24 ENCOUNTER — Telehealth: Payer: Self-pay | Admitting: *Deleted

## 2020-09-24 NOTE — Telephone Encounter (Signed)
Faxed notification received from [employer] that [facility] patient assistance approved through 09/12/2021

## 2020-10-08 ENCOUNTER — Ambulatory Visit (INDEPENDENT_AMBULATORY_CARE_PROVIDER_SITE_OTHER): Payer: Medicare Other

## 2020-10-08 ENCOUNTER — Other Ambulatory Visit: Payer: Self-pay

## 2020-10-08 DIAGNOSIS — I429 Cardiomyopathy, unspecified: Secondary | ICD-10-CM | POA: Diagnosis not present

## 2020-10-08 DIAGNOSIS — I517 Cardiomegaly: Secondary | ICD-10-CM | POA: Diagnosis not present

## 2020-10-08 DIAGNOSIS — I058 Other rheumatic mitral valve diseases: Secondary | ICD-10-CM | POA: Diagnosis not present

## 2020-10-08 DIAGNOSIS — I358 Other nonrheumatic aortic valve disorders: Secondary | ICD-10-CM | POA: Diagnosis not present

## 2020-10-08 LAB — ECHOCARDIOGRAM COMPLETE
Area-P 1/2: 3.42 cm2
Calc EF: 43 %
MV M vel: 5.64 m/s
MV Peak grad: 127.2 mmHg
Radius: 0.3 cm
S' Lateral: 4.39 cm
Single Plane A2C EF: 43.9 %
Single Plane A4C EF: 44.2 %

## 2020-10-10 ENCOUNTER — Telehealth: Payer: Self-pay | Admitting: *Deleted

## 2020-10-10 NOTE — Telephone Encounter (Signed)
-----   Message from Satira Sark, MD sent at 10/08/2020  2:18 PM EST ----- Results reviewed.  LVEF stable in the range of 40 to 45%.  Would continue with current medications and follow-up plan.

## 2020-10-10 NOTE — Telephone Encounter (Signed)
Patient informed. Copy sent to PCP °

## 2020-10-24 ENCOUNTER — Encounter: Payer: Self-pay | Admitting: Cardiology

## 2020-10-24 DIAGNOSIS — I10 Essential (primary) hypertension: Secondary | ICD-10-CM | POA: Diagnosis not present

## 2020-10-24 DIAGNOSIS — Z1159 Encounter for screening for other viral diseases: Secondary | ICD-10-CM | POA: Diagnosis not present

## 2020-10-24 DIAGNOSIS — N183 Chronic kidney disease, stage 3 unspecified: Secondary | ICD-10-CM | POA: Diagnosis not present

## 2020-10-24 DIAGNOSIS — E1122 Type 2 diabetes mellitus with diabetic chronic kidney disease: Secondary | ICD-10-CM | POA: Diagnosis not present

## 2020-10-24 DIAGNOSIS — E875 Hyperkalemia: Secondary | ICD-10-CM | POA: Diagnosis not present

## 2020-10-24 DIAGNOSIS — E7849 Other hyperlipidemia: Secondary | ICD-10-CM | POA: Diagnosis not present

## 2020-10-24 DIAGNOSIS — E782 Mixed hyperlipidemia: Secondary | ICD-10-CM | POA: Diagnosis not present

## 2020-10-28 DIAGNOSIS — E7849 Other hyperlipidemia: Secondary | ICD-10-CM | POA: Diagnosis not present

## 2020-10-28 DIAGNOSIS — I43 Cardiomyopathy in diseases classified elsewhere: Secondary | ICD-10-CM | POA: Diagnosis not present

## 2020-10-28 DIAGNOSIS — I10 Essential (primary) hypertension: Secondary | ICD-10-CM | POA: Diagnosis not present

## 2020-10-28 DIAGNOSIS — I130 Hypertensive heart and chronic kidney disease with heart failure and stage 1 through stage 4 chronic kidney disease, or unspecified chronic kidney disease: Secondary | ICD-10-CM | POA: Diagnosis not present

## 2020-10-28 DIAGNOSIS — I5022 Chronic systolic (congestive) heart failure: Secondary | ICD-10-CM | POA: Diagnosis not present

## 2020-10-28 DIAGNOSIS — E1122 Type 2 diabetes mellitus with diabetic chronic kidney disease: Secondary | ICD-10-CM | POA: Diagnosis not present

## 2020-10-28 DIAGNOSIS — E11649 Type 2 diabetes mellitus with hypoglycemia without coma: Secondary | ICD-10-CM | POA: Diagnosis not present

## 2020-10-28 DIAGNOSIS — I119 Hypertensive heart disease without heart failure: Secondary | ICD-10-CM | POA: Diagnosis not present

## 2020-11-13 DIAGNOSIS — R300 Dysuria: Secondary | ICD-10-CM | POA: Diagnosis not present

## 2020-11-13 DIAGNOSIS — Z7689 Persons encountering health services in other specified circumstances: Secondary | ICD-10-CM | POA: Diagnosis not present

## 2020-11-13 DIAGNOSIS — R350 Frequency of micturition: Secondary | ICD-10-CM | POA: Diagnosis not present

## 2021-01-10 DIAGNOSIS — N183 Chronic kidney disease, stage 3 unspecified: Secondary | ICD-10-CM | POA: Diagnosis not present

## 2021-01-10 DIAGNOSIS — I130 Hypertensive heart and chronic kidney disease with heart failure and stage 1 through stage 4 chronic kidney disease, or unspecified chronic kidney disease: Secondary | ICD-10-CM | POA: Diagnosis not present

## 2021-01-10 DIAGNOSIS — E1122 Type 2 diabetes mellitus with diabetic chronic kidney disease: Secondary | ICD-10-CM | POA: Diagnosis not present

## 2021-01-10 DIAGNOSIS — I5022 Chronic systolic (congestive) heart failure: Secondary | ICD-10-CM | POA: Diagnosis not present

## 2021-01-28 ENCOUNTER — Ambulatory Visit: Payer: Medicare Other | Admitting: Cardiology

## 2021-01-28 ENCOUNTER — Encounter: Payer: Self-pay | Admitting: *Deleted

## 2021-01-28 ENCOUNTER — Encounter: Payer: Self-pay | Admitting: Cardiology

## 2021-01-28 VITALS — BP 124/60 | HR 74 | Ht 66.0 in | Wt 178.0 lb

## 2021-01-28 DIAGNOSIS — I10 Essential (primary) hypertension: Secondary | ICD-10-CM | POA: Diagnosis not present

## 2021-01-28 DIAGNOSIS — I429 Cardiomyopathy, unspecified: Secondary | ICD-10-CM

## 2021-01-28 DIAGNOSIS — N1832 Chronic kidney disease, stage 3b: Secondary | ICD-10-CM

## 2021-01-28 NOTE — Progress Notes (Signed)
Cardiology Office Note  Date: 01/28/2021   ID: Kayla Bell, DOB [birth year], MRN [id number]  PCP:  Manon Hilding, MD  Cardiologist:  Rozann Lesches, MD Electrophysiologist:  None   Chief Complaint  Patient presents with  . Cardiac follow-up    History of Present Illness: Kayla Bell is a 75 y.o. female last seen in November 2021.  She is here for a follow-up visit.  Reports no exertional chest pain and NYHA class II dyspnea with typical ADLs.  She has had no palpitations or syncope.  Follow-up echocardiogram in January revealed stable LVEF in the range of 40 to 45% with normal RV contraction and mild mitral regurgitation.  I discussed this with her today.  We went over her medications.  She has done well on Entresto and Coreg.  We are requesting her last lab work from Dr. Quintin Alto.  Would like to add Aldactone and possibly SGLT2 inhibitor depending on her baseline renal function.  Past Medical History:  Diagnosis Date  . Anemia   . Anxiety   . Arthritis   . Cardiomyopathy ([place])   . Chronic kidney disease, stage 3 ([place])   . Depression   . Essential hypertension   . GERD (gastroesophageal reflux disease)   . Gout   . Hyperlipidemia   . Type 2 diabetes mellitus ([place])     Past Surgical History:  Procedure Laterality Date  . BREAST SURGERY Left   . CHOLECYSTECTOMY    . EYE SURGERY Bilateral   . PARTIAL HYSTERECTOMY    . RIGHT ARM SURGERY Right   . TOTAL KNEE ARTHROPLASTY Right 06/07/2019   Procedure: TOTAL KNEE ARTHROPLASTY;  Surgeon: Paralee Cancel, MD;  Location: [facility];  Service: Orthopedics;  Laterality: Right;  70 mins    Current Outpatient Medications  Medication Sig Dispense Refill  . allopurinol (ZYLOPRIM) 100 MG tablet Take 150 mg by mouth every evening.     Marland Kitchen amLODipine (NORVASC) 5 MG tablet Take 5 mg by mouth daily.     . carvedilol (COREG) 25 MG tablet Take 25 mg by mouth 2 (two) times daily with a meal.    . clonazePAM (KLONOPIN) 0.5 MG  tablet Take 0.5 mg by mouth every 8 (eight) hours as needed for anxiety.     . fluticasone (FLONASE) 50 MCG/ACT nasal spray Place 1 spray into both nostrils daily.    . furosemide (LASIX) 20 MG tablet Take 1 tablet (20 mg total) by mouth daily as needed (based on fluid and weight). 30 tablet 1  . insulin glargine (LANTUS) 100 UNIT/ML injection Inject 45 Units into the skin daily at 6 PM.     . lansoprazole (PREVACID) 15 MG capsule Take 15 mg by mouth daily at 12 noon.    . pravastatin (PRAVACHOL) 40 MG tablet Take 40 mg by mouth at bedtime.     . sacubitril-valsartan (ENTRESTO) 49-51 MG Take 1 tablet by mouth 2 (two) times daily. 180 tablet 3   No current facility-administered medications for this visit.   Allergies:  Penicillins   ROS: No palpitations or syncope.  Physical Exam: VS:  BP 124/60   Pulse 74   Ht '5\' 6"'$  (1.676 m)   Wt 178 lb (80.7 kg)   SpO2 98%   BMI 28.73 kg/m , BMI Body mass index is 28.73 kg/m.  Wt Readings from Last 3 Encounters:  01/28/21 178 lb (80.7 kg)  07/31/20 170 lb (77.1 kg)  12/18/19 170 lb 3.2 oz (77.2 kg)  General: Patient appears comfortable at rest. HEENT: Conjunctiva and lids normal, wearing a mask. Neck: Supple, no elevated JVP or carotid bruits, no thyromegaly. Lungs: Clear to auscultation, nonlabored breathing at rest. Cardiac: Regular rate and rhythm, no S3, 1/6 systolic murmur, no pericardial rub. Extremities: No pitting edema.  ECG:  An ECG dated 07/31/2020 was personally reviewed today and demonstrated:  Sinus rhythm.  Recent Labwork:  No interval lab work for review today.  Other Studies Reviewed Today:  Echocardiogram 10/08/2020: 1. Left ventricular ejection fraction, by estimation, is 40 to 45%. The  left ventricle has mildly decreased function. The left ventricle  demonstrates regional wall motion abnormalities (see scoring  diagram/findings for description). The left ventricular  internal cavity size was mildly dilated.  There is mild left ventricular  hypertrophy. Left ventricular diastolic parameters are indeterminate.  Abnormal global longitudinal strain of -12.0%.  2. Right ventricular systolic function is normal. The right ventricular  size is normal. There is normal pulmonary artery systolic pressure. The  estimated right ventricular systolic pressure is 99991111 mmHg.  3. The mitral valve is grossly normal. Mild mitral valve regurgitation.  4. The aortic valve is tricuspid. There is mild calcification of the  aortic valve. Aortic valve regurgitation is not visualized.  5. The inferior vena cava is normal in size with greater than 50%  respiratory variability, suggesting right atrial pressure of 3 mmHg.   Assessment and Plan:  1.  Nonischemic cardiomyopathy with chronic systolic heart failure.  She appears euvolemic on examination today, only uses Lasix as needed.  Her weight is up from the last visit, but no leg swelling and lungs are clear.  Plan to continue Coreg and Entresto.  Requesting interval lab work from PCP.  Would like to consider addition of Aldactone and possibly SGLT2 inhibitor depending on her renal function.  2.  Essential hypertension, blood pressure is well controlled today.  She is also on Norvasc.  3.  CKD stage IIIb, requesting interval lab work for review.  Medication Adjustments/Labs and Tests Ordered: Current medicines are reviewed at length with the patient today.  Concerns regarding medicines are outlined above.   Tests Ordered: No orders of the defined types were placed in this encounter.   Medication Changes: No orders of the defined types were placed in this encounter.   Disposition:  Follow up 6 months.  Signed, Satira Sark, MD, [facility] 01/28/2021 1:22 PM    [facility] at [place], [place], [place] [address] Phone: [phone]; Fax: [phone]

## 2021-01-28 NOTE — Patient Instructions (Addendum)
Medication Instructions:   Your physician recommends that you continue on your current medications as directed. Please refer to the Current Medication list given to you today.  We will call you about medication adjustments after your doctor reviews your lab work.  Labwork:  none  Testing/Procedures:  none  Follow-Up:  Your physician recommends that you schedule a follow-up appointment in: 6 months. You will receive a reminder call or letter in the mail in about 4 months reminding you to call and schedule your appointment. If you don't receive this letter, please contact our office.  Any Other Special Instructions Will Be Listed Below (If Applicable).  If you need a refill on your cardiac medications before your next appointment, please call your pharmacy.

## 2021-01-29 ENCOUNTER — Telehealth: Payer: Self-pay | Admitting: *Deleted

## 2021-01-29 ENCOUNTER — Other Ambulatory Visit: Payer: Self-pay | Admitting: *Deleted

## 2021-01-29 DIAGNOSIS — I5022 Chronic systolic (congestive) heart failure: Secondary | ICD-10-CM

## 2021-01-29 DIAGNOSIS — Z79899 Other long term (current) drug therapy: Secondary | ICD-10-CM

## 2021-01-29 MED ORDER — SPIRONOLACTONE 25 MG PO TABS: 12.5000 mg | ORAL_TABLET | Freq: Every day | ORAL | 1 refills | Status: DC

## 2021-01-29 NOTE — Telephone Encounter (Signed)
-----   Message from Satira Sark, MD sent at 01/28/2021  3:08 PM EDT ----- Results reviewed.  Creatinine 1.64 with GFR in the 30s.  I would hold off on starting an SGLT2 inhibitor, but I think we should start low-dose Aldactone 12.5 mg daily.  Check BMET in 7 to 10 days after starting.

## 2021-01-29 NOTE — Telephone Encounter (Signed)
Patient informed and verbalized understanding of plan. Lab order faxed to [facility] 

## 2021-02-10 DIAGNOSIS — Z79899 Other long term (current) drug therapy: Secondary | ICD-10-CM | POA: Diagnosis not present

## 2021-02-10 DIAGNOSIS — I5022 Chronic systolic (congestive) heart failure: Secondary | ICD-10-CM | POA: Diagnosis not present

## 2021-02-12 DIAGNOSIS — Z96651 Presence of right artificial knee joint: Secondary | ICD-10-CM | POA: Diagnosis not present

## 2021-02-12 DIAGNOSIS — M25511 Pain in right shoulder: Secondary | ICD-10-CM | POA: Diagnosis not present

## 2021-02-12 DIAGNOSIS — M109 Gout, unspecified: Secondary | ICD-10-CM | POA: Diagnosis not present

## 2021-02-12 DIAGNOSIS — M25561 Pain in right knee: Secondary | ICD-10-CM | POA: Diagnosis not present

## 2021-02-12 DIAGNOSIS — M1A9XX1 Chronic gout, unspecified, with tophus (tophi): Secondary | ICD-10-CM | POA: Diagnosis not present

## 2021-02-12 DIAGNOSIS — M25512 Pain in left shoulder: Secondary | ICD-10-CM | POA: Diagnosis not present

## 2021-02-12 DIAGNOSIS — M1990 Unspecified osteoarthritis, unspecified site: Secondary | ICD-10-CM | POA: Diagnosis not present

## 2021-02-12 DIAGNOSIS — E119 Type 2 diabetes mellitus without complications: Secondary | ICD-10-CM | POA: Diagnosis not present

## 2021-02-12 DIAGNOSIS — M25562 Pain in left knee: Secondary | ICD-10-CM | POA: Diagnosis not present

## 2021-02-12 DIAGNOSIS — M25461 Effusion, right knee: Secondary | ICD-10-CM | POA: Diagnosis not present

## 2021-02-12 DIAGNOSIS — M25572 Pain in left ankle and joints of left foot: Secondary | ICD-10-CM | POA: Diagnosis not present

## 2021-02-12 DIAGNOSIS — N183 Chronic kidney disease, stage 3 unspecified: Secondary | ICD-10-CM | POA: Diagnosis not present

## 2021-02-19 DIAGNOSIS — E7849 Other hyperlipidemia: Secondary | ICD-10-CM | POA: Diagnosis not present

## 2021-02-19 DIAGNOSIS — E782 Mixed hyperlipidemia: Secondary | ICD-10-CM | POA: Diagnosis not present

## 2021-02-19 DIAGNOSIS — I130 Hypertensive heart and chronic kidney disease with heart failure and stage 1 through stage 4 chronic kidney disease, or unspecified chronic kidney disease: Secondary | ICD-10-CM | POA: Diagnosis not present

## 2021-02-19 DIAGNOSIS — E1122 Type 2 diabetes mellitus with diabetic chronic kidney disease: Secondary | ICD-10-CM | POA: Diagnosis not present

## 2021-02-20 ENCOUNTER — Telehealth: Payer: Self-pay | Admitting: *Deleted

## 2021-02-20 NOTE — Telephone Encounter (Deleted)
-----   Message from Satira Sark, MD sent at 02/18/2021  8:46 AM EDT ----- Results reviewed.  Creatinine 1.84 and potassium 4.8.  Let's initiate Aldactone 12.5 mg daily.  Recheck BMET in 2 weeks.

## 2021-02-20 NOTE — Telephone Encounter (Signed)
-----   Message from Satira Sark, MD sent at 02/18/2021  8:46 AM EDT ----- Results reviewed.  Creatinine 1.84 and potassium 4.8.  Let's initiate Aldactone 12.5 mg daily.  Recheck BMET in 2 weeks.

## 2021-02-20 NOTE — Telephone Encounter (Signed)
Contacted per patient request and advised that lab work isn't needed since she is already on aldactone and lab work was done after initiation of aldactone on 01/29/2021. Verbalized understanding

## 2021-02-20 NOTE — Telephone Encounter (Signed)
Lab results given to patient. Will repeat BMET in 2 weeks.

## 2021-02-24 DIAGNOSIS — E1122 Type 2 diabetes mellitus with diabetic chronic kidney disease: Secondary | ICD-10-CM | POA: Diagnosis not present

## 2021-02-24 DIAGNOSIS — E11649 Type 2 diabetes mellitus with hypoglycemia without coma: Secondary | ICD-10-CM | POA: Diagnosis not present

## 2021-02-24 DIAGNOSIS — E7849 Other hyperlipidemia: Secondary | ICD-10-CM | POA: Diagnosis not present

## 2021-02-24 DIAGNOSIS — I130 Hypertensive heart and chronic kidney disease with heart failure and stage 1 through stage 4 chronic kidney disease, or unspecified chronic kidney disease: Secondary | ICD-10-CM | POA: Diagnosis not present

## 2021-02-24 DIAGNOSIS — N183 Chronic kidney disease, stage 3 unspecified: Secondary | ICD-10-CM | POA: Diagnosis not present

## 2021-02-24 DIAGNOSIS — D509 Iron deficiency anemia, unspecified: Secondary | ICD-10-CM | POA: Diagnosis not present

## 2021-02-24 DIAGNOSIS — I10 Essential (primary) hypertension: Secondary | ICD-10-CM | POA: Diagnosis not present

## 2021-02-24 DIAGNOSIS — I43 Cardiomyopathy in diseases classified elsewhere: Secondary | ICD-10-CM | POA: Diagnosis not present

## 2021-02-24 DIAGNOSIS — I5022 Chronic systolic (congestive) heart failure: Secondary | ICD-10-CM | POA: Diagnosis not present

## 2021-02-25 DIAGNOSIS — I2510 Atherosclerotic heart disease of native coronary artery without angina pectoris: Secondary | ICD-10-CM | POA: Diagnosis not present

## 2021-02-25 DIAGNOSIS — Z041 Encounter for examination and observation following transport accident: Secondary | ICD-10-CM | POA: Diagnosis not present

## 2021-02-25 DIAGNOSIS — R0789 Other chest pain: Secondary | ICD-10-CM | POA: Diagnosis not present

## 2021-02-25 DIAGNOSIS — M7989 Other specified soft tissue disorders: Secondary | ICD-10-CM | POA: Diagnosis not present

## 2021-02-25 DIAGNOSIS — R079 Chest pain, unspecified: Secondary | ICD-10-CM | POA: Diagnosis not present

## 2021-02-25 DIAGNOSIS — R748 Abnormal levels of other serum enzymes: Secondary | ICD-10-CM | POA: Diagnosis not present

## 2021-02-25 DIAGNOSIS — R109 Unspecified abdominal pain: Secondary | ICD-10-CM | POA: Diagnosis not present

## 2021-02-25 DIAGNOSIS — M25561 Pain in right knee: Secondary | ICD-10-CM | POA: Diagnosis not present

## 2021-02-25 DIAGNOSIS — M25552 Pain in left hip: Secondary | ICD-10-CM | POA: Diagnosis not present

## 2021-02-25 DIAGNOSIS — R918 Other nonspecific abnormal finding of lung field: Secondary | ICD-10-CM | POA: Diagnosis not present

## 2021-02-25 DIAGNOSIS — R6889 Other general symptoms and signs: Secondary | ICD-10-CM | POA: Diagnosis not present

## 2021-02-25 DIAGNOSIS — Z96651 Presence of right artificial knee joint: Secondary | ICD-10-CM | POA: Diagnosis not present

## 2021-02-25 DIAGNOSIS — S301XXA Contusion of abdominal wall, initial encounter: Secondary | ICD-10-CM | POA: Diagnosis not present

## 2021-02-25 DIAGNOSIS — M542 Cervicalgia: Secondary | ICD-10-CM | POA: Diagnosis not present

## 2021-02-25 DIAGNOSIS — I700 Atherosclerosis of aorta: Secondary | ICD-10-CM | POA: Diagnosis not present

## 2021-02-25 DIAGNOSIS — N189 Chronic kidney disease, unspecified: Secondary | ICD-10-CM | POA: Diagnosis not present

## 2021-02-25 DIAGNOSIS — R519 Headache, unspecified: Secondary | ICD-10-CM | POA: Diagnosis not present

## 2021-02-25 DIAGNOSIS — R404 Transient alteration of awareness: Secondary | ICD-10-CM | POA: Diagnosis not present

## 2021-02-25 DIAGNOSIS — M25551 Pain in right hip: Secondary | ICD-10-CM | POA: Diagnosis not present

## 2021-02-25 DIAGNOSIS — Z743 Need for continuous supervision: Secondary | ICD-10-CM | POA: Diagnosis not present

## 2021-04-09 DIAGNOSIS — S8000XA Contusion of unspecified knee, initial encounter: Secondary | ICD-10-CM | POA: Diagnosis not present

## 2021-04-09 DIAGNOSIS — Z6823 Body mass index (BMI) 23.0-23.9, adult: Secondary | ICD-10-CM | POA: Diagnosis not present

## 2021-04-09 DIAGNOSIS — S46919A Strain of unspecified muscle, fascia and tendon at shoulder and upper arm level, unspecified arm, initial encounter: Secondary | ICD-10-CM | POA: Diagnosis not present

## 2021-04-09 DIAGNOSIS — I700 Atherosclerosis of aorta: Secondary | ICD-10-CM | POA: Diagnosis not present

## 2021-04-09 DIAGNOSIS — R918 Other nonspecific abnormal finding of lung field: Secondary | ICD-10-CM | POA: Diagnosis not present

## 2021-05-07 DIAGNOSIS — E875 Hyperkalemia: Secondary | ICD-10-CM | POA: Diagnosis not present

## 2021-05-07 DIAGNOSIS — N184 Chronic kidney disease, stage 4 (severe): Secondary | ICD-10-CM | POA: Diagnosis not present

## 2021-05-07 DIAGNOSIS — I5042 Chronic combined systolic (congestive) and diastolic (congestive) heart failure: Secondary | ICD-10-CM | POA: Diagnosis not present

## 2021-05-07 DIAGNOSIS — N1832 Chronic kidney disease, stage 3b: Secondary | ICD-10-CM | POA: Diagnosis not present

## 2021-05-07 DIAGNOSIS — E559 Vitamin D deficiency, unspecified: Secondary | ICD-10-CM | POA: Diagnosis not present

## 2021-05-07 DIAGNOSIS — Z79899 Other long term (current) drug therapy: Secondary | ICD-10-CM | POA: Diagnosis not present

## 2021-05-07 DIAGNOSIS — R809 Proteinuria, unspecified: Secondary | ICD-10-CM | POA: Diagnosis not present

## 2021-05-07 DIAGNOSIS — I129 Hypertensive chronic kidney disease with stage 1 through stage 4 chronic kidney disease, or unspecified chronic kidney disease: Secondary | ICD-10-CM | POA: Diagnosis not present

## 2021-06-18 DIAGNOSIS — N184 Chronic kidney disease, stage 4 (severe): Secondary | ICD-10-CM | POA: Diagnosis not present

## 2021-06-23 DIAGNOSIS — I129 Hypertensive chronic kidney disease with stage 1 through stage 4 chronic kidney disease, or unspecified chronic kidney disease: Secondary | ICD-10-CM | POA: Diagnosis not present

## 2021-06-23 DIAGNOSIS — E875 Hyperkalemia: Secondary | ICD-10-CM | POA: Diagnosis not present

## 2021-06-23 DIAGNOSIS — N184 Chronic kidney disease, stage 4 (severe): Secondary | ICD-10-CM | POA: Diagnosis not present

## 2021-06-24 DIAGNOSIS — N184 Chronic kidney disease, stage 4 (severe): Secondary | ICD-10-CM | POA: Diagnosis not present

## 2021-06-24 DIAGNOSIS — R809 Proteinuria, unspecified: Secondary | ICD-10-CM | POA: Diagnosis not present

## 2021-06-24 DIAGNOSIS — E875 Hyperkalemia: Secondary | ICD-10-CM | POA: Diagnosis not present

## 2021-06-24 DIAGNOSIS — I5042 Chronic combined systolic (congestive) and diastolic (congestive) heart failure: Secondary | ICD-10-CM | POA: Diagnosis not present

## 2021-06-24 DIAGNOSIS — I129 Hypertensive chronic kidney disease with stage 1 through stage 4 chronic kidney disease, or unspecified chronic kidney disease: Secondary | ICD-10-CM | POA: Diagnosis not present

## 2021-06-30 ENCOUNTER — Other Ambulatory Visit: Payer: Self-pay | Admitting: *Deleted

## 2021-06-30 MED ORDER — ENTRESTO 49-51 MG PO TABS: 1.0000 | ORAL_TABLET | Freq: Two times a day (BID) | ORAL | 1 refills | Status: DC

## 2021-07-13 DIAGNOSIS — E7849 Other hyperlipidemia: Secondary | ICD-10-CM | POA: Diagnosis not present

## 2021-07-13 DIAGNOSIS — E1122 Type 2 diabetes mellitus with diabetic chronic kidney disease: Secondary | ICD-10-CM | POA: Diagnosis not present

## 2021-07-13 DIAGNOSIS — N189 Chronic kidney disease, unspecified: Secondary | ICD-10-CM | POA: Diagnosis not present

## 2021-07-13 DIAGNOSIS — I10 Essential (primary) hypertension: Secondary | ICD-10-CM | POA: Diagnosis not present

## 2021-07-13 DIAGNOSIS — E782 Mixed hyperlipidemia: Secondary | ICD-10-CM | POA: Diagnosis not present

## 2021-07-16 DIAGNOSIS — I5022 Chronic systolic (congestive) heart failure: Secondary | ICD-10-CM | POA: Diagnosis not present

## 2021-07-16 DIAGNOSIS — I43 Cardiomyopathy in diseases classified elsewhere: Secondary | ICD-10-CM | POA: Diagnosis not present

## 2021-07-16 DIAGNOSIS — E11649 Type 2 diabetes mellitus with hypoglycemia without coma: Secondary | ICD-10-CM | POA: Diagnosis not present

## 2021-07-16 DIAGNOSIS — Z23 Encounter for immunization: Secondary | ICD-10-CM | POA: Diagnosis not present

## 2021-07-16 DIAGNOSIS — I10 Essential (primary) hypertension: Secondary | ICD-10-CM | POA: Diagnosis not present

## 2021-07-16 DIAGNOSIS — D509 Iron deficiency anemia, unspecified: Secondary | ICD-10-CM | POA: Diagnosis not present

## 2021-07-16 DIAGNOSIS — E1122 Type 2 diabetes mellitus with diabetic chronic kidney disease: Secondary | ICD-10-CM | POA: Diagnosis not present

## 2021-08-12 DIAGNOSIS — E1122 Type 2 diabetes mellitus with diabetic chronic kidney disease: Secondary | ICD-10-CM | POA: Diagnosis not present

## 2021-08-12 DIAGNOSIS — I130 Hypertensive heart and chronic kidney disease with heart failure and stage 1 through stage 4 chronic kidney disease, or unspecified chronic kidney disease: Secondary | ICD-10-CM | POA: Diagnosis not present

## 2021-08-12 DIAGNOSIS — I5022 Chronic systolic (congestive) heart failure: Secondary | ICD-10-CM | POA: Diagnosis not present

## 2021-08-12 DIAGNOSIS — N183 Chronic kidney disease, stage 3 unspecified: Secondary | ICD-10-CM | POA: Diagnosis not present

## 2021-08-25 DIAGNOSIS — E875 Hyperkalemia: Secondary | ICD-10-CM | POA: Diagnosis not present

## 2021-08-25 DIAGNOSIS — I5042 Chronic combined systolic (congestive) and diastolic (congestive) heart failure: Secondary | ICD-10-CM | POA: Diagnosis not present

## 2021-08-25 DIAGNOSIS — I129 Hypertensive chronic kidney disease with stage 1 through stage 4 chronic kidney disease, or unspecified chronic kidney disease: Secondary | ICD-10-CM | POA: Diagnosis not present

## 2021-08-25 DIAGNOSIS — R809 Proteinuria, unspecified: Secondary | ICD-10-CM | POA: Diagnosis not present

## 2021-08-25 DIAGNOSIS — N184 Chronic kidney disease, stage 4 (severe): Secondary | ICD-10-CM | POA: Diagnosis not present

## 2021-08-28 DIAGNOSIS — I5042 Chronic combined systolic (congestive) and diastolic (congestive) heart failure: Secondary | ICD-10-CM | POA: Diagnosis not present

## 2021-08-28 DIAGNOSIS — R809 Proteinuria, unspecified: Secondary | ICD-10-CM | POA: Diagnosis not present

## 2021-08-28 DIAGNOSIS — E211 Secondary hyperparathyroidism, not elsewhere classified: Secondary | ICD-10-CM | POA: Diagnosis not present

## 2021-08-28 DIAGNOSIS — N184 Chronic kidney disease, stage 4 (severe): Secondary | ICD-10-CM | POA: Diagnosis not present

## 2021-08-28 DIAGNOSIS — I129 Hypertensive chronic kidney disease with stage 1 through stage 4 chronic kidney disease, or unspecified chronic kidney disease: Secondary | ICD-10-CM | POA: Diagnosis not present

## 2021-09-03 ENCOUNTER — Ambulatory Visit: Payer: Medicare Other | Admitting: Cardiology

## 2021-09-03 ENCOUNTER — Encounter: Payer: Self-pay | Admitting: Cardiology

## 2021-09-03 VITALS — BP 124/72 | HR 72 | Ht 66.0 in | Wt 171.8 lb

## 2021-09-03 DIAGNOSIS — I10 Essential (primary) hypertension: Secondary | ICD-10-CM | POA: Diagnosis not present

## 2021-09-03 DIAGNOSIS — I429 Cardiomyopathy, unspecified: Secondary | ICD-10-CM

## 2021-09-03 DIAGNOSIS — N184 Chronic kidney disease, stage 4 (severe): Secondary | ICD-10-CM | POA: Diagnosis not present

## 2021-09-03 DIAGNOSIS — I5020 Unspecified systolic (congestive) heart failure: Secondary | ICD-10-CM

## 2021-09-03 NOTE — Patient Instructions (Addendum)
Medication Instructions:  Your physician recommends that you continue on your current medications as directed. Please refer to the Current Medication list given to you today.  Labwork: none  Testing/Procedures: Your physician has requested that you have an echocardiogram in January 2023. Echocardiography is a painless test that uses sound waves to create images of your heart. It provides your doctor with information about the size and shape of your heart and how well your hearts chambers and valves are working. This procedure takes approximately one hour. There are no restrictions for this procedure.  Follow-Up: Your physician recommends that you schedule a follow-up appointment in: 6 months  Any Other Special Instructions Will Be Listed Below (If Applicable).  If you need a refill on your cardiac medications before your next appointment, please call your pharmacy.

## 2021-09-03 NOTE — Progress Notes (Signed)
Cardiology Office Note  Date: 09/03/2021   ID: Kayla, Bell Mar 28, 1946, MRN [id number]  PCP:  Manon Hilding, MD  Cardiologist:  Rozann Lesches, MD Electrophysiologist:  None   Chief Complaint  Patient presents with   Cardiac follow-up    History of Present Illness: Kayla Bell is a 75 y.o. female last seen in May.  She is here for a follow-up visit.  Since last encounter she has had to come off of Entresto and Aldactone due to worsening renal function and hyperkalemia.  She continues to follow with Dr. Theador Hawthorne, I reviewed her recent lab work.  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  She is now on low-dose lisinopril.  In terms of symptoms she states that she has been feeling well, NYHA class I-II dyspnea, no palpitations or syncope.  She uses Lasix only occasionally and her weight has been stable.  No orthopnea or PND.  I personally reviewed her ECG today which shows sinus rhythm with PVCs, decreased R wave progression.  Her last echocardiogram in January of this year revealed LVEF 40 to 45%.  Past Medical History:  Diagnosis Date   Anemia    Anxiety    Arthritis    Cardiomyopathy ([place])    Chronic kidney disease, stage 3 (HCC)    Depression    Essential hypertension    GERD (gastroesophageal reflux disease)    Gout    Hyperlipidemia    Type 2 diabetes mellitus ([place])     Past Surgical History:  Procedure Laterality Date   BREAST SURGERY Left    CHOLECYSTECTOMY     EYE SURGERY Bilateral    PARTIAL HYSTERECTOMY     RIGHT ARM SURGERY Right    TOTAL KNEE ARTHROPLASTY Right 06/07/2019   Procedure: TOTAL KNEE ARTHROPLASTY;  Surgeon: Paralee Cancel, MD;  Location: [facility];  Service: Orthopedics;  Laterality: Right;  70 mins    Current Outpatient Medications  Medication Sig Dispense Refill   allopurinol (ZYLOPRIM) 100 MG tablet Take 150 mg by mouth every evening.      amLODipine (NORVASC) 5 MG tablet Take 5 mg by mouth daily.      carvedilol (COREG) 25  MG tablet Take 25 mg by mouth 2 (two) times daily with a meal.     clonazePAM (KLONOPIN) 0.5 MG tablet Take 0.5 mg by mouth every 8 (eight) hours as needed for anxiety.      fluticasone (FLONASE) 50 MCG/ACT nasal spray Place 1 spray into both nostrils daily.     furosemide (LASIX) 20 MG tablet Take 1 tablet (20 mg total) by mouth daily as needed (based on fluid and weight). 30 tablet 1   insulin glargine (LANTUS) 100 UNIT/ML injection Inject 45 Units into the skin daily at 6 PM.      lansoprazole (PREVACID) 15 MG capsule Take 15 mg by mouth daily at 12 noon.     lisinopril (ZESTRIL) 2.5 MG tablet Take 2.5 mg by mouth daily.     pravastatin (PRAVACHOL) 40 MG tablet Take 40 mg by mouth at bedtime.      No current facility-administered medications for this visit.   Allergies:  Penicillins   ROS: No palpitations or syncope.  Physical Exam: VS:  BP 124/72    Pulse 72    Ht 5\' 6"  (1.676 m)    Wt 171 lb 12.8 oz (77.9 kg)    SpO2 98%    BMI 27.73 kg/m , BMI Body mass index is  27.73 kg/m.  Wt Readings from Last 3 Encounters:  09/03/21 171 lb 12.8 oz (77.9 kg)  01/28/21 178 lb (80.7 kg)  07/31/20 170 lb (77.1 kg)    General: Patient appears comfortable at rest. HEENT: Conjunctiva and lids normal, wearing a mask. Neck: Supple, no elevated JVP or carotid bruits, no thyromegaly. Lungs: Clear to auscultation, nonlabored breathing at rest. Cardiac: Regular rate and rhythm, no S3, 1/6 systolic murmur. Abdomen: Soft, nontender, no hepatomegaly, bowel sounds present, no guarding or rebound. Extremities: No pitting edema.  ECG:  An ECG dated 07/31/2020 was personally reviewed today and demonstrated:  Sinus rhythm.  Recent Labwork:  December 2022: Hemoglobin 11.6, platelets 281, potassium 5.0, BUN 32, creatinine 2.22  Other Studies Reviewed Today:  Echocardiogram 10/08/2020:  1. Left ventricular ejection fraction, by estimation, is 40 to 45%. The  left ventricle has mildly decreased function.  The left ventricle  demonstrates regional wall motion abnormalities (see scoring  diagram/findings for description). The left ventricular   internal cavity size was mildly dilated. There is mild left ventricular  hypertrophy. Left ventricular diastolic parameters are indeterminate.  Abnormal global longitudinal strain of -12.0%.   2. Right ventricular systolic function is normal. The right ventricular  size is normal. There is normal pulmonary artery systolic pressure. The  estimated right ventricular systolic pressure is 42.8 mmHg.   3. The mitral valve is grossly normal. Mild mitral valve regurgitation.   4. The aortic valve is tricuspid. There is mild calcification of the  aortic valve. Aortic valve regurgitation is not visualized.   5. The inferior vena cava is normal in size with greater than 50%  respiratory variability, suggesting right atrial pressure of 3 mmHg.   Assessment and Plan:  1.  HFrEF with nonischemic cardiomyopathy.  She is euvolemic, weight stable.  Was not able to stay on Entresto and Aldactone due to worsening renal function and hyperkalemia.  She has now on low-dose lisinopril per Dr. Theador Hawthorne and otherwise continues on Coreg with as needed Lasix.  We will obtain a follow-up echocardiogram in January.  2.  Essential hypertension, blood pressure is well controlled today.  She is also on Norvasc.  3.  CKD stage IIIb-IV.  Continue to follow with Dr. Theador Hawthorne.  Medication Adjustments/Labs and Tests Ordered: Current medicines are reviewed at length with the patient today.  Concerns regarding medicines are outlined above.   Tests Ordered: Orders Placed This Encounter  Procedures   EKG 12-Lead   ECHOCARDIOGRAM COMPLETE    Medication Changes: No orders of the defined types were placed in this encounter.   Disposition:  Follow up  6 months.  Signed, Satira Sark, MD, [facility] 09/03/2021 4:24 PM    [facility] [place], [place], [place] [address] Phone: [phone]; Fax: [phone]

## 2021-10-13 ENCOUNTER — Other Ambulatory Visit: Payer: Self-pay

## 2021-10-13 ENCOUNTER — Ambulatory Visit (INDEPENDENT_AMBULATORY_CARE_PROVIDER_SITE_OTHER): Payer: Medicare Other

## 2021-10-13 DIAGNOSIS — I428 Other cardiomyopathies: Secondary | ICD-10-CM | POA: Diagnosis not present

## 2021-10-13 DIAGNOSIS — I429 Cardiomyopathy, unspecified: Secondary | ICD-10-CM

## 2021-10-13 LAB — ECHOCARDIOGRAM COMPLETE
AR max vel: 2.46 cm2
AV Area VTI: 2.33 cm2
AV Area mean vel: 2.62 cm2
AV Mean grad: 4 mmHg
AV Peak grad: 7.6 mmHg
Ao pk vel: 1.38 m/s
Area-P 1/2: 2.61 cm2
Calc EF: 46.4 %
MV M vel: 5.57 m/s
MV Peak grad: 124.2 mmHg
MV VTI: 2.25 cm2
Radius: 0.45 cm
S' Lateral: 4.81 cm
Single Plane A2C EF: 42.9 %
Single Plane A4C EF: 50.3 %

## 2021-10-14 ENCOUNTER — Telehealth: Payer: Self-pay | Admitting: *Deleted

## 2021-10-14 DIAGNOSIS — I5020 Unspecified systolic (congestive) heart failure: Secondary | ICD-10-CM

## 2021-10-14 DIAGNOSIS — I5022 Chronic systolic (congestive) heart failure: Secondary | ICD-10-CM

## 2021-10-14 DIAGNOSIS — I429 Cardiomyopathy, unspecified: Secondary | ICD-10-CM

## 2021-10-14 DIAGNOSIS — R931 Abnormal findings on diagnostic imaging of heart and coronary circulation: Secondary | ICD-10-CM

## 2021-10-14 DIAGNOSIS — I340 Nonrheumatic mitral (valve) insufficiency: Secondary | ICD-10-CM

## 2021-10-14 DIAGNOSIS — N1832 Chronic kidney disease, stage 3b: Secondary | ICD-10-CM

## 2021-10-14 DIAGNOSIS — I10 Essential (primary) hypertension: Secondary | ICD-10-CM

## 2021-10-14 NOTE — Telephone Encounter (Signed)
-----   Message from Satira Sark, MD sent at 10/13/2021  5:23 PM EST ----- Results reviewed.  Echocardiogram shows some improvement in LVEF, now 45 to 50% range.  Mitral regurgitation is moderate at this point.  Strain measurements suggest that we should consider work-up to exclude amyloidosis (condition that can sometimes cause infiltration of the heart muscle and LV dysfunction).  This would involve ordering SPEP, UPEP, and serum free light chain analysis.  There is also an imaging study that can be done through radiology at [facility] called a PYP scan.  Please check with heart failure team nurse about how they handle ordering these studies so that the correct orders are placed in Epic.  I can follow-up on the results and determine if we need to pursue anything further.

## 2021-10-15 ENCOUNTER — Other Ambulatory Visit: Payer: Self-pay | Admitting: *Deleted

## 2021-10-15 DIAGNOSIS — R931 Abnormal findings on diagnostic imaging of heart and coronary circulation: Secondary | ICD-10-CM

## 2021-10-15 DIAGNOSIS — N1832 Chronic kidney disease, stage 3b: Secondary | ICD-10-CM

## 2021-10-15 DIAGNOSIS — I5020 Unspecified systolic (congestive) heart failure: Secondary | ICD-10-CM

## 2021-10-15 DIAGNOSIS — I340 Nonrheumatic mitral (valve) insufficiency: Secondary | ICD-10-CM

## 2021-10-15 DIAGNOSIS — I10 Essential (primary) hypertension: Secondary | ICD-10-CM

## 2021-10-15 DIAGNOSIS — I429 Cardiomyopathy, unspecified: Secondary | ICD-10-CM

## 2021-10-15 DIAGNOSIS — I5022 Chronic systolic (congestive) heart failure: Secondary | ICD-10-CM

## 2021-10-15 NOTE — Telephone Encounter (Signed)
Patient informed and verbalized understanding of plan. Copy sent to PCP 

## 2021-10-19 ENCOUNTER — Other Ambulatory Visit: Payer: Self-pay

## 2021-10-19 ENCOUNTER — Telehealth: Payer: Self-pay | Admitting: Cardiology

## 2021-10-19 ENCOUNTER — Other Ambulatory Visit (HOSPITAL_COMMUNITY)
Admission: RE | Admit: 2021-10-19 | Discharge: 2021-10-19 | Disposition: A | Discharge status: Home / Self Care | Payer: Medicare Other | Source: Ambulatory Visit | Attending: Cardiology | Admitting: Cardiology

## 2021-10-19 DIAGNOSIS — I5022 Chronic systolic (congestive) heart failure: Secondary | ICD-10-CM | POA: Diagnosis not present

## 2021-10-19 DIAGNOSIS — I10 Essential (primary) hypertension: Secondary | ICD-10-CM | POA: Insufficient documentation

## 2021-10-19 DIAGNOSIS — I340 Nonrheumatic mitral (valve) insufficiency: Secondary | ICD-10-CM | POA: Diagnosis not present

## 2021-10-19 DIAGNOSIS — R931 Abnormal findings on diagnostic imaging of heart and coronary circulation: Secondary | ICD-10-CM | POA: Diagnosis not present

## 2021-10-19 DIAGNOSIS — I5020 Unspecified systolic (congestive) heart failure: Secondary | ICD-10-CM | POA: Insufficient documentation

## 2021-10-19 DIAGNOSIS — I429 Cardiomyopathy, unspecified: Secondary | ICD-10-CM | POA: Diagnosis not present

## 2021-10-19 DIAGNOSIS — N1832 Chronic kidney disease, stage 3b: Secondary | ICD-10-CM | POA: Insufficient documentation

## 2021-10-19 NOTE — Telephone Encounter (Signed)
Patient is calling stating the hospital gave her a jug to collect her urine for 24 hours and they advised her to call to see if she needs to return it to the hospital or bring it to the [place] office. Please advise.

## 2021-10-19 NOTE — Telephone Encounter (Signed)
Informed patient that she will need to take back to [facility] to be processed.

## 2021-10-20 LAB — PROTEIN ELECTROPHORESIS, SERUM
A/G Ratio: 1.1 (ref 0.7–1.7)
Albumin ELP: 3.8 g/dL (ref 2.9–4.4)
Alpha-1-Globulin: 0.2 g/dL (ref 0.0–0.4)
Alpha-2-Globulin: 1 g/dL (ref 0.4–1.0)
Beta Globulin: 1.1 g/dL (ref 0.7–1.3)
Gamma Globulin: 1 g/dL (ref 0.4–1.8)
Globulin, Total: 3.4 g/dL (ref 2.2–3.9)
Total Protein ELP: 7.2 g/dL (ref 6.0–8.5)

## 2021-10-20 LAB — MISC LABCORP TEST (SEND OUT): Labcorp test code: 121210

## 2021-10-20 NOTE — Telephone Encounter (Signed)
Contacted and discussed in detail the reason for PYP scan to rule out cardiac amylodosis which could cause stiffness in the heart muscle from the amyloid deposits (protein).  Verbalized understanding.

## 2021-10-20 NOTE — Telephone Encounter (Signed)
Patient is following up. She would like to discuss the reasoning behind ordering the PYP.

## 2021-10-22 ENCOUNTER — Telehealth: Payer: Self-pay | Admitting: *Deleted

## 2021-10-22 ENCOUNTER — Telehealth (HOSPITAL_COMMUNITY): Payer: Self-pay | Admitting: *Deleted

## 2021-10-22 NOTE — Telephone Encounter (Signed)
Patient informed. Copy sent to PCP °

## 2021-10-22 NOTE — Telephone Encounter (Signed)
Close encounter 

## 2021-10-22 NOTE — Telephone Encounter (Signed)
-----   Message from Satira Sark, MD sent at 10/20/2021  5:10 PM EST ----- Results reviewed.  This component of her lab work does not suggest a monoclonal protein spike to suggest AL amyloidosis.  Continue with work-up in place.

## 2021-10-23 ENCOUNTER — Other Ambulatory Visit: Payer: Self-pay

## 2021-10-23 ENCOUNTER — Ambulatory Visit (HOSPITAL_COMMUNITY)
Admission: RE | Admit: 2021-10-23 | Discharge: 2021-10-23 | Disposition: A | Discharge status: Home / Self Care | Payer: Medicare Other | Source: Ambulatory Visit | Attending: Internal Medicine | Admitting: Internal Medicine

## 2021-10-23 DIAGNOSIS — I429 Cardiomyopathy, unspecified: Secondary | ICD-10-CM | POA: Diagnosis not present

## 2021-10-23 DIAGNOSIS — I5020 Unspecified systolic (congestive) heart failure: Secondary | ICD-10-CM

## 2021-10-23 DIAGNOSIS — N1832 Chronic kidney disease, stage 3b: Secondary | ICD-10-CM | POA: Diagnosis not present

## 2021-10-23 DIAGNOSIS — R931 Abnormal findings on diagnostic imaging of heart and coronary circulation: Secondary | ICD-10-CM | POA: Diagnosis not present

## 2021-10-23 DIAGNOSIS — I5022 Chronic systolic (congestive) heart failure: Secondary | ICD-10-CM

## 2021-10-23 DIAGNOSIS — I340 Nonrheumatic mitral (valve) insufficiency: Secondary | ICD-10-CM

## 2021-10-23 DIAGNOSIS — I10 Essential (primary) hypertension: Secondary | ICD-10-CM | POA: Diagnosis not present

## 2021-10-23 LAB — UPEP/UIFE/LIGHT CHAINS/TP, 24-HR UR
% BETA, Urine: 30.9 %
ALPHA 1 URINE: 4.3 %
Albumin, U: 49.6 %
Alpha 2, Urine: 3 %
Free Kappa Lt Chains,Ur: 32.3 mg/L (ref 1.17–86.46)
Free Kappa/Lambda Ratio: 7.64 (ref 1.83–14.26)
Free Lambda Lt Chains,Ur: 4.23 mg/L (ref 0.27–15.21)
GAMMA GLOBULIN URINE: 12.2 %
Total Protein, Urine-Ur/day: 336 mg/24 hr — ABNORMAL HIGH (ref 30–150)
Total Protein, Urine: 16.8 mg/dL
Total Volume: 2000

## 2021-10-23 MED ORDER — TECHNETIUM TC 99M PYROPHOSPHATE
21.8000 | Freq: Once | INTRAVENOUS | Status: AC
  Administered 2021-10-23: 21.8 via INTRAVENOUS

## 2021-10-28 ENCOUNTER — Telehealth: Payer: Self-pay | Admitting: *Deleted

## 2021-10-28 DIAGNOSIS — R931 Abnormal findings on diagnostic imaging of heart and coronary circulation: Secondary | ICD-10-CM

## 2021-10-28 DIAGNOSIS — I5032 Chronic diastolic (congestive) heart failure: Secondary | ICD-10-CM

## 2021-10-28 DIAGNOSIS — I429 Cardiomyopathy, unspecified: Secondary | ICD-10-CM

## 2021-10-28 NOTE — Telephone Encounter (Signed)
-----   Message from Kayla Sark, MD sent at 10/27/2021  4:45 PM EST ----- Results reviewed.  PYP scan interpreted as equivocal for TTR amyloidosis.  She did not have a monoclonal spike on recent studies to suggest AL amyloidosis.  Given her recent echocardiographic findings I would see if she would be willing to be evaluated in the advanced heart failure clinic to have them review her studies and determine if we need to pursue any other evaluation of her cardiomyopathy.  Medical therapy has been limited as discussed in my recent notes.

## 2021-10-28 NOTE — Telephone Encounter (Signed)
-----   Message from Satira Sark, MD sent at 10/24/2021  4:58 PM EST ----- Results reviewed. Proteinuria noted, but not monoclonal pattern to suggest AL amyloidosis.

## 2021-10-28 NOTE — Telephone Encounter (Signed)
Patient informed. Copy sent to PCP °

## 2021-10-30 DIAGNOSIS — R809 Proteinuria, unspecified: Secondary | ICD-10-CM | POA: Diagnosis not present

## 2021-10-30 DIAGNOSIS — I129 Hypertensive chronic kidney disease with stage 1 through stage 4 chronic kidney disease, or unspecified chronic kidney disease: Secondary | ICD-10-CM | POA: Diagnosis not present

## 2021-10-30 DIAGNOSIS — N184 Chronic kidney disease, stage 4 (severe): Secondary | ICD-10-CM | POA: Diagnosis not present

## 2021-10-30 DIAGNOSIS — E211 Secondary hyperparathyroidism, not elsewhere classified: Secondary | ICD-10-CM | POA: Diagnosis not present

## 2021-10-30 DIAGNOSIS — I509 Heart failure, unspecified: Secondary | ICD-10-CM | POA: Diagnosis not present

## 2021-11-04 DIAGNOSIS — E211 Secondary hyperparathyroidism, not elsewhere classified: Secondary | ICD-10-CM | POA: Diagnosis not present

## 2021-11-04 DIAGNOSIS — I129 Hypertensive chronic kidney disease with stage 1 through stage 4 chronic kidney disease, or unspecified chronic kidney disease: Secondary | ICD-10-CM | POA: Diagnosis not present

## 2021-11-04 DIAGNOSIS — N184 Chronic kidney disease, stage 4 (severe): Secondary | ICD-10-CM | POA: Diagnosis not present

## 2021-11-04 DIAGNOSIS — I5042 Chronic combined systolic (congestive) and diastolic (congestive) heart failure: Secondary | ICD-10-CM | POA: Diagnosis not present

## 2021-11-04 DIAGNOSIS — E875 Hyperkalemia: Secondary | ICD-10-CM | POA: Diagnosis not present

## 2021-11-04 DIAGNOSIS — R809 Proteinuria, unspecified: Secondary | ICD-10-CM | POA: Diagnosis not present

## 2021-11-10 ENCOUNTER — Telehealth (HOSPITAL_COMMUNITY): Payer: Self-pay | Admitting: Vascular Surgery

## 2021-11-10 NOTE — Telephone Encounter (Signed)
Lvm giving pt new chf appt w/ [facility] 3/14

## 2021-11-11 DIAGNOSIS — N189 Chronic kidney disease, unspecified: Secondary | ICD-10-CM | POA: Diagnosis not present

## 2021-11-11 DIAGNOSIS — E1165 Type 2 diabetes mellitus with hyperglycemia: Secondary | ICD-10-CM | POA: Diagnosis not present

## 2021-11-11 DIAGNOSIS — E781 Pure hyperglyceridemia: Secondary | ICD-10-CM | POA: Diagnosis not present

## 2021-11-11 DIAGNOSIS — I10 Essential (primary) hypertension: Secondary | ICD-10-CM | POA: Diagnosis not present

## 2021-11-13 DIAGNOSIS — I509 Heart failure, unspecified: Secondary | ICD-10-CM | POA: Diagnosis not present

## 2021-11-13 DIAGNOSIS — I129 Hypertensive chronic kidney disease with stage 1 through stage 4 chronic kidney disease, or unspecified chronic kidney disease: Secondary | ICD-10-CM | POA: Diagnosis not present

## 2021-11-13 DIAGNOSIS — E875 Hyperkalemia: Secondary | ICD-10-CM | POA: Diagnosis not present

## 2021-11-13 DIAGNOSIS — N184 Chronic kidney disease, stage 4 (severe): Secondary | ICD-10-CM | POA: Diagnosis not present

## 2021-11-13 DIAGNOSIS — E211 Secondary hyperparathyroidism, not elsewhere classified: Secondary | ICD-10-CM | POA: Diagnosis not present

## 2021-11-13 DIAGNOSIS — R809 Proteinuria, unspecified: Secondary | ICD-10-CM | POA: Diagnosis not present

## 2021-11-16 DIAGNOSIS — D509 Iron deficiency anemia, unspecified: Secondary | ICD-10-CM | POA: Diagnosis not present

## 2021-11-16 DIAGNOSIS — I10 Essential (primary) hypertension: Secondary | ICD-10-CM | POA: Diagnosis not present

## 2021-11-16 DIAGNOSIS — I5022 Chronic systolic (congestive) heart failure: Secondary | ICD-10-CM | POA: Diagnosis not present

## 2021-11-16 DIAGNOSIS — I130 Hypertensive heart and chronic kidney disease with heart failure and stage 1 through stage 4 chronic kidney disease, or unspecified chronic kidney disease: Secondary | ICD-10-CM | POA: Diagnosis not present

## 2021-11-16 DIAGNOSIS — I129 Hypertensive chronic kidney disease with stage 1 through stage 4 chronic kidney disease, or unspecified chronic kidney disease: Secondary | ICD-10-CM | POA: Diagnosis not present

## 2021-11-16 DIAGNOSIS — I43 Cardiomyopathy in diseases classified elsewhere: Secondary | ICD-10-CM | POA: Diagnosis not present

## 2021-11-16 DIAGNOSIS — E1122 Type 2 diabetes mellitus with diabetic chronic kidney disease: Secondary | ICD-10-CM | POA: Diagnosis not present

## 2021-11-16 DIAGNOSIS — E11649 Type 2 diabetes mellitus with hypoglycemia without coma: Secondary | ICD-10-CM | POA: Diagnosis not present

## 2021-11-24 ENCOUNTER — Encounter (HOSPITAL_COMMUNITY): Payer: Medicare Other | Admitting: Cardiology

## 2021-11-25 DIAGNOSIS — N183 Chronic kidney disease, stage 3 unspecified: Secondary | ICD-10-CM | POA: Diagnosis not present

## 2021-11-25 DIAGNOSIS — Z79899 Other long term (current) drug therapy: Secondary | ICD-10-CM | POA: Diagnosis not present

## 2021-11-25 DIAGNOSIS — M1990 Unspecified osteoarthritis, unspecified site: Secondary | ICD-10-CM | POA: Diagnosis not present

## 2021-11-25 DIAGNOSIS — M109 Gout, unspecified: Secondary | ICD-10-CM | POA: Diagnosis not present

## 2021-11-25 DIAGNOSIS — E119 Type 2 diabetes mellitus without complications: Secondary | ICD-10-CM | POA: Diagnosis not present

## 2021-11-25 DIAGNOSIS — M1A9XX1 Chronic gout, unspecified, with tophus (tophi): Secondary | ICD-10-CM | POA: Diagnosis not present

## 2021-12-07 ENCOUNTER — Telehealth: Payer: Self-pay | Admitting: Cardiology

## 2021-12-07 NOTE — Telephone Encounter (Signed)
?  Pt c/o of Chest Pain: STAT if CP now or developed within 24 hours ? ?1. Are you having CP right now? No  ? ?2. Are you experiencing any other symptoms (ex. SOB, nausea, vomiting, sweating)? Coughing  ? ?3. How long have you been experiencing CP?  ? ?4. Is your CP continuous or coming and going?  ? ?5. Have you taken Nitroglycerin?  ? ?Pt said, she is feeling heavy on her chest and she is coughing. She said this is the same feeling she had when she was diagnosed with CHF. She said this is concerning and she doesn't have an appt till 03/29 with Dr. Aundra Dubin, she wanted to get recommendation from Dr. Domenic Polite ??  ?

## 2021-12-09 ENCOUNTER — Encounter (HOSPITAL_COMMUNITY): Payer: Self-pay | Admitting: Cardiology

## 2021-12-09 ENCOUNTER — Ambulatory Visit (HOSPITAL_COMMUNITY)
Admission: RE | Admit: 2021-12-09 | Discharge: 2021-12-09 | Disposition: A | Discharge status: Home / Self Care | Payer: Medicare Other | Source: Ambulatory Visit | Attending: Cardiology | Admitting: Cardiology

## 2021-12-09 ENCOUNTER — Other Ambulatory Visit: Payer: Self-pay

## 2021-12-09 ENCOUNTER — Other Ambulatory Visit (HOSPITAL_COMMUNITY): Payer: Self-pay

## 2021-12-09 VITALS — BP 140/68 | HR 73 | Wt 169.8 lb

## 2021-12-09 DIAGNOSIS — Z79899 Other long term (current) drug therapy: Secondary | ICD-10-CM | POA: Insufficient documentation

## 2021-12-09 DIAGNOSIS — I208 Other forms of angina pectoris: Secondary | ICD-10-CM | POA: Diagnosis not present

## 2021-12-09 DIAGNOSIS — I130 Hypertensive heart and chronic kidney disease with heart failure and stage 1 through stage 4 chronic kidney disease, or unspecified chronic kidney disease: Secondary | ICD-10-CM | POA: Diagnosis not present

## 2021-12-09 DIAGNOSIS — E1122 Type 2 diabetes mellitus with diabetic chronic kidney disease: Secondary | ICD-10-CM | POA: Insufficient documentation

## 2021-12-09 DIAGNOSIS — R0789 Other chest pain: Secondary | ICD-10-CM | POA: Insufficient documentation

## 2021-12-09 DIAGNOSIS — I5032 Chronic diastolic (congestive) heart failure: Secondary | ICD-10-CM | POA: Diagnosis not present

## 2021-12-09 DIAGNOSIS — I429 Cardiomyopathy, unspecified: Secondary | ICD-10-CM | POA: Diagnosis not present

## 2021-12-09 DIAGNOSIS — Z7984 Long term (current) use of oral hypoglycemic drugs: Secondary | ICD-10-CM | POA: Insufficient documentation

## 2021-12-09 DIAGNOSIS — N184 Chronic kidney disease, stage 4 (severe): Secondary | ICD-10-CM

## 2021-12-09 DIAGNOSIS — I2089 Other forms of angina pectoris: Secondary | ICD-10-CM

## 2021-12-09 LAB — CBC
HCT: 36.3 % (ref 36.0–46.0)
Hemoglobin: 11.3 g/dL — ABNORMAL LOW (ref 12.0–15.0)
MCH: 27 pg (ref 26.0–34.0)
MCHC: 31.1 g/dL (ref 30.0–36.0)
MCV: 86.8 fL (ref 80.0–100.0)
Platelets: 249 10*3/uL (ref 150–400)
RBC: 4.18 MIL/uL (ref 3.87–5.11)
RDW: 15.7 % — ABNORMAL HIGH (ref 11.5–15.5)
WBC: 7.4 10*3/uL (ref 4.0–10.5)
nRBC: 0 % (ref 0.0–0.2)

## 2021-12-09 LAB — BASIC METABOLIC PANEL
Anion gap: 7 (ref 5–15)
BUN: 32 mg/dL — ABNORMAL HIGH (ref 8–23)
CO2: 24 mmol/L (ref 22–32)
Calcium: 9 mg/dL (ref 8.9–10.3)
Chloride: 109 mmol/L (ref 98–111)
Creatinine, Ser: 1.91 mg/dL — ABNORMAL HIGH (ref 0.44–1.00)
GFR, Estimated: 27 mL/min — ABNORMAL LOW (ref >60)
Glucose, Bld: 206 mg/dL — ABNORMAL HIGH (ref 70–99)
Potassium: 5.2 mmol/L — ABNORMAL HIGH (ref 3.5–5.1)
Sodium: 140 mmol/L (ref 135–145)

## 2021-12-09 LAB — BRAIN NATRIURETIC PEPTIDE: B Natriuretic Peptide: 152.4 pg/mL — ABNORMAL HIGH (ref 0.0–100.0)

## 2021-12-09 MED ORDER — DAPAGLIFLOZIN PROPANEDIOL 10 MG PO TABS: 10.0000 mg | ORAL_TABLET | Freq: Every day | ORAL | 11 refills | Status: DC

## 2021-12-09 NOTE — Telephone Encounter (Signed)
Reports chest feeling heavy for the past 5 days with coughing rated 10/10. Reports when laying down on left side, the heaviness is worse. Reports playing a game with daughter prior to chest heaviness that may have pulled a muscle in chest. Reports rubbing some pain relieving arthritis cream on chest and felt relief. Reports cough has improved but had runny nose last night. Denies fever, nausea, SOB or dizziness. Reports heaviness continues rated 6/10. Has appointment with Dr. Aundra Dubin today. Advised to keep that appointment and if symptoms get worse, to go to the ED for an evaluation. Verbalized understanding of plan. ?

## 2021-12-09 NOTE — Patient Instructions (Addendum)
Medication Changes: ? ?Stop Lasix ? ?Start Farxiga 10 mg daily ? ?Lab Work: ? ?Labs done today, your results will be available in MyChart, we will contact you for abnormal readings. ? ? ?Testing/Procedures: ? ? ?Repeat blood work in 10 days at [facility] ?Genetic test has been done, this has to be sent to [place] to be processed and can take 1-2 weeks to get results back.  We will let you know the results. ? ?How to Prepare for Your Myoview Test (stress test): ? ?Your medications may be taken with water. ?Nothing to eat or drink, except water, 4 hours prior to arrival time.  NO caffeine/decaffeinated products, or chocolate 12 hours prior to arrival. ?Ladies, please do not wear dresses.  Skirts or pants are approprate, please wear a short sleeve shirt. ?NO perfume, cologne or lotion ?Wear comfortable walking shoes.  NO HEELS! ?Total time is 3 to 4 hours; you may want to bring reading material for the waiting time. ?Please report to [facility] for your test ? ?What to expect after you arrive: ? ?Once you arrive and check in for your appointment an IV will be started in your arm.  Then the Technoligist will inject a small amount of radioactive tracer.  There will be a 1 hour waiting period after this injection.  A series of pictures will be taken of your heart following this waiting period.  You will be prepped for the stress portion of the test.  During the stress portion of your test you will either walk on a treadmill or receive a small, safe amount of radioactive tracer injected in your IV.  After the stress portion, there is a short rest period during which time your heart and blood pressure will be monitored.  After the short rest period the Technologist will begin your second set of pictures.  Your doctor will inform you of your test results within 7-10 business days. ? ?In preparation for your appointment, medication and supplies will be purchased.  Appointment availability is limited, so if you need to  cancel or reschedule please call the office at [phone] 24 hours in advance to avoid a cancellation fee of $100.00 ? ?ONCE APPROVED BY YOUR INSURANCE YOU WILL BE CALLED TO SCHEDULED THE TEST. ? ?Your physician has requested that you have a cardiac MRI. Cardiac MRI uses a computer to create images of your heart as its beating, producing both still and moving pictures of your heart and major blood vessels. For further information please visit [email or web address] Please follow the instruction sheet given to you today for more information. ? ?ONCE APPROVED BY YOUR INSURANCE COMPANY YOU WILL BE CALLED TO SCHEDULE TO TEST ? ? ?Referrals: ? ?none ? ?Special Instructions // Education: ? ?none ? ?Follow-Up in: 2 months  ? ?At the [facility], you and your health needs are our priority. We have a designated team specialized in the treatment of Heart Failure. This Care Team includes your primary Heart Failure Specialized Cardiologist (physician), Advanced Practice Providers (APPs- Physician Assistants and Nurse Practitioners), and Pharmacist who all work together to provide you with the care you need, when you need it.  ? ?You may see any of the following providers on your designated Care Team at your next follow up: ? ?Dr Glori Bickers ?Dr Loralie Champagne ?Darrick Grinder, NP ?Lyda Jester, PA ?Jessica Milford,NP ?Marlyce Huge, PA ?Audry Riles, PharmD ? ? ?Please be sure to bring in all your medications bottles to every appointment.  ? ?  Need to Contact us: ? ?If you have any questions or concerns before your next appointment please send [place] a message through [place] or call our office at [phone].   ? ?TO LEAVE A MESSAGE FOR THE NURSE SELECT OPTION 2, PLEASE LEAVE A MESSAGE INCLUDING: ?YOUR NAME ?DATE OF BIRTH ?CALL BACK NUMBER ?REASON FOR CALL**this is important as we prioritize the call backs ? ?YOU WILL RECEIVE A CALL BACK THE SAME DAY AS LONG AS YOU CALL BEFORE 4:00 PM ? ? ? ? ?

## 2021-12-09 NOTE — Progress Notes (Signed)
Blood collected for TTR genetic testing per Dr McLean.  Order form completed and both shipped by [employer] to [facility].  

## 2021-12-10 NOTE — Progress Notes (Signed)
PCP: Manon Hilding, MD ?Cardiology: Dr. Domenic Polite ?HF Cardiology: Dr. Aundra Dubin ? ?76 y.o. with history of CKD stage 4, chronic HF with mid range EF, type 2 diabetes, and HTN was referred by Dr. Domenic Polite for evaluation of possible cardiac amyloidosis.  Patient has had a long-standing mild cardiomyopathy, with most recent echo in 1/23 showing EF 45-50%, mild LV dilation, mild LVH, GLS -12.8% with apical sparing (?amyloidosis), normal RV, trivial pericardial effusion, moderate MR. She had a PYP scan in 2/23 that was equivocal with Grade 2, H/CL 1-1.5.  Remote ischemic workup was negative (Cardiolite in 2002).  Baseline creatinine is around 2.2. She is off Entresto and spironolactone with elevated creatinine and hyperkalemia.  ? ?Patient does not get short of breath walking on flat ground.  She denies orthopnea/PND.  She gets short of breath walking up hills or inclines.  No diarrhea, abdominal discomfort, constipation.  No symptoms of neuropathy or carpal tunnel syndrome.  She does report on and off central chest heaviness recently.  She recent had an entire day where she felt chest heaviness.  Symptoms are not exertional.   ? ?ECG (personally reviewed): NSR, PVC ? ?Labs (2/23): K 5.7, creatinine 2.2, urine immunofixation negative, SPEP negative.  ?Labs (3/23): K 4.6 ? ?PMH: ?1. CKD stage 4 ?2. HTN ?3. Depression ?4. Gout  ?5. Type 2 diabetes ?6. Chronic HF with mid range EF: Uncertain etiology. ?- Negative stress test around 2002 at [facility].  ?- Echo (9/14): EF 35-40% ?- Echo (1/22): EF 40-45% ?- Echo (1/23): EF 45-50%, mild LV dilation, mild LVH, GLS -12.8% with apical sparing (?amyloidosis), normal RV, trivial pericardial effusion, moderate MR.  ?- PYP scan (2/23): Grade 2, H/CL 1-1.5 (equivocal).  ? ?Social History  ? ?Socioeconomic History  ? Marital status: Married  ?  Spouse name: Not on file  ? Number of children: Not on file  ? Years of education: Not on file  ? Highest education level: Not on file   ?Occupational History  ? Not on file  ?Tobacco Use  ? Smoking status: Never  ? Smokeless tobacco: Never  ?Vaping Use  ? Vaping Use: Never used  ?Substance and Sexual Activity  ? Alcohol use: Never  ?  Alcohol/week: 0.0 standard drinks  ? Drug use: Never  ? Sexual activity: Not on file  ?Other Topics Concern  ? Not on file  ?Social History Narrative  ? Not on file  ? ?Social Determinants of Health  ? ?Financial Resource Strain: Not on file  ?Food Insecurity: Not on file  ?Transportation Needs: Not on file  ?Physical Activity: Not on file  ?Stress: Not on file  ?Social Connections: Not on file  ?Intimate Partner Violence: Not on file  ? ?Family History  ?Problem Relation Age of Onset  ? Kidney disease Sister   ? Heart disease Sister   ? Heart disease Brother   ? Heart failure Son   ? Heart disease Sister   ? ?ROS: All systems reviewed and negative except as per HPI.  ? ?Current Outpatient Medications  ?Medication Sig Dispense Refill  ? allopurinol (ZYLOPRIM) 100 MG tablet Take 150 mg by mouth every evening.     ? amLODipine (NORVASC) 5 MG tablet Take 5 mg by mouth daily.     ? carvedilol (COREG) 25 MG tablet Take 25 mg by mouth 2 (two) times daily with a meal.    ? clonazePAM (KLONOPIN) 0.5 MG tablet Take 0.5 mg by mouth every 8 (eight) hours as  needed for anxiety.     ? dapagliflozin propanediol (FARXIGA) 10 MG TABS tablet Take 1 tablet (10 mg total) by mouth daily before breakfast. 30 tablet 11  ? fluticasone (FLONASE) 50 MCG/ACT nasal spray Place 1 spray into both nostrils daily.    ? Insulin Glargine (LANTUS [place]) Inject 30 Units into the skin daily.    ? lansoprazole (PREVACID) 30 MG capsule Take 30 mg by mouth daily.    ? lisinopril (ZESTRIL) 2.5 MG tablet Take 2.5 mg by mouth daily.    ? pravastatin (PRAVACHOL) 40 MG tablet Take 40 mg by mouth at bedtime.     ? ?No current facility-administered medications for this encounter.  ? ?BP 140/68   Pulse 73   Wt 77 kg (169 lb 12.8 oz)   SpO2 99%   BMI 27.41 kg/m?   ?General: NAD ?Neck: No JVD, no thyromegaly or thyroid nodule.  ?Lungs: Clear to auscultation bilaterally with normal respiratory effort. ?CV: Nondisplaced PMI.  Heart regular S1/S2, no S3/S4, no murmur.  No peripheral edema.  No carotid bruit.  Normal pedal pulses.  ?Abdomen: Soft, nontender, no hepatosplenomegaly, no distention.  ?Skin: Intact without lesions or rashes.  ?Neurologic: Alert and oriented x 3.  ?Psych: Normal affect. ?Extremities: No clubbing or cyanosis.  ?HEENT: Normal.  ? ?Assessment/Plan: ?1. Chronic HF with mid range EF: Cardiomyopathy of uncertain etiology.  She had a remote Cardiolite at [facility] that was negative. Most recent echo in 1/23 showed EF 45-50%, mild LV dilation, mild LVH, GLS -12.8% with apical sparing (?amyloidosis), normal RV, trivial pericardial effusion, moderate MR. PYP scan in 2/23 was equivocal.  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 She does not look volume overloaded on exam. NYHA class II.  ?- I will send off genetic testing for common TTR mutations.  ?- I will arrange for cardiac MRI to assess for cardiac amyloidosis.  ?- Continue Coreg 25 mg bid.  ?- Continue lisinopril 2.5 mg daily (would not increase with elevated creatinine).  ?- She will stop taking Lasix three days/week and will instead start Farxiga or Jardiance 10 mg daily. BMET/BNP today, BMET 10 days.  ?2. Chest pain: Patient has been having atypical chest pain.   ?- I will arrange for ETT-Cardiolite.  ?- High threshold for coronary angiography given CKD stage IV.  ?3. CKD stage IV: BMET today.  ?4. HTN: SBP usually in the 120s at home, will not change regimen.  ? ?Loralie Champagne ?12/10/2021 ? ? ?

## 2021-12-22 ENCOUNTER — Telehealth: Payer: Self-pay | Admitting: Cardiology

## 2021-12-22 ENCOUNTER — Encounter (HOSPITAL_COMMUNITY): Payer: Self-pay | Admitting: Cardiology

## 2021-12-22 ENCOUNTER — Other Ambulatory Visit (HOSPITAL_COMMUNITY)
Admission: RE | Admit: 2021-12-22 | Discharge: 2021-12-22 | Disposition: A | Discharge status: Home / Self Care | Payer: Medicare Other | Source: Ambulatory Visit | Attending: Cardiology | Admitting: Cardiology

## 2021-12-22 DIAGNOSIS — I5022 Chronic systolic (congestive) heart failure: Secondary | ICD-10-CM | POA: Diagnosis not present

## 2021-12-22 LAB — BASIC METABOLIC PANEL
Anion gap: 8 (ref 5–15)
BUN: 34 mg/dL — ABNORMAL HIGH (ref 8–23)
CO2: 25 mmol/L (ref 22–32)
Calcium: 9.2 mg/dL (ref 8.9–10.3)
Chloride: 111 mmol/L (ref 98–111)
Creatinine, Ser: 1.9 mg/dL — ABNORMAL HIGH (ref 0.44–1.00)
GFR, Estimated: 27 mL/min — ABNORMAL LOW (ref >60)
Glucose, Bld: 109 mg/dL — ABNORMAL HIGH (ref 70–99)
Potassium: 4.5 mmol/L (ref 3.5–5.1)
Sodium: 144 mmol/L (ref 135–145)

## 2021-12-22 NOTE — Telephone Encounter (Signed)
Patient is requesting a call to review 4/14 stress test instructions. ?

## 2021-12-24 ENCOUNTER — Telehealth (HOSPITAL_COMMUNITY): Payer: Self-pay | Admitting: *Deleted

## 2021-12-24 ENCOUNTER — Telehealth: Payer: Self-pay | Admitting: Cardiology

## 2021-12-24 NOTE — Telephone Encounter (Signed)
Close encounter 

## 2021-12-24 NOTE — Telephone Encounter (Signed)
Sent message to the [facility] office asking them to reach out to the patient as they preform the lexiscan and would be better placed to answer he questions  ?

## 2021-12-24 NOTE — Telephone Encounter (Signed)
? ?  CMRI auth no pre cert reqd  ?

## 2021-12-24 NOTE — Telephone Encounter (Signed)
Follow Up: ? ? ? ?Patient is calling, concerning her instructions for her test scheduled  for tomorrow. ?

## 2021-12-25 ENCOUNTER — Telehealth (HOSPITAL_COMMUNITY): Payer: Self-pay

## 2021-12-25 ENCOUNTER — Ambulatory Visit (HOSPITAL_COMMUNITY)
Admission: RE | Admit: 2021-12-25 | Discharge: 2021-12-25 | Disposition: A | Discharge status: Home / Self Care | Payer: Medicare Other | Source: Ambulatory Visit | Attending: Cardiovascular Disease | Admitting: Cardiovascular Disease

## 2021-12-25 DIAGNOSIS — I208 Other forms of angina pectoris: Secondary | ICD-10-CM

## 2021-12-25 LAB — MYOCARDIAL PERFUSION IMAGING
LV dias vol: 159 mL (ref 46–106)
LV sys vol: 110 mL
Nuc Stress EF: 31 %
Peak HR: 85 {beats}/min
Rest HR: 68 {beats}/min
Rest Nuclear Isotope Dose: 10.9 mCi
SDS: 0
SRS: 4
SSS: 4
ST Depression (mm): 0 mm
Stress Nuclear Isotope Dose: 32.1 mCi
TID: 1.02

## 2021-12-25 MED ORDER — REGADENOSON 0.4 MG/5ML IV SOLN
0.4000 mg | Freq: Once | INTRAVENOUS | Status: AC
Start: 2021-12-25 — End: 2021-12-25
  Administered 2021-12-25: 0.4 mg via INTRAVENOUS

## 2021-12-25 MED ORDER — TECHNETIUM TC 99M TETROFOSMIN IV KIT
10.9000 | PACK | Freq: Once | INTRAVENOUS | Status: AC | PRN
  Administered 2021-12-25: 10.9 via INTRAVENOUS
  Filled 2021-12-25: qty 11

## 2021-12-25 MED ORDER — TECHNETIUM TC 99M TETROFOSMIN IV KIT
32.1000 | PACK | Freq: Once | INTRAVENOUS | Status: AC | PRN
  Administered 2021-12-25: 32.1 via INTRAVENOUS
  Filled 2021-12-25: qty 33

## 2021-12-25 NOTE — Telephone Encounter (Addendum)
Pt aware, agreeable, and verbalized understanding ? ? ? ?----- Message from Larey Dresser, MD sent at 12/25/2021  2:09 PM EDT ----- ?No evidence for ischemia/infarction. Suspect nonischemic cardiomyopathy.  ?

## 2021-12-30 DIAGNOSIS — N184 Chronic kidney disease, stage 4 (severe): Secondary | ICD-10-CM | POA: Diagnosis not present

## 2022-01-02 DIAGNOSIS — R809 Proteinuria, unspecified: Secondary | ICD-10-CM | POA: Diagnosis not present

## 2022-01-02 DIAGNOSIS — D631 Anemia in chronic kidney disease: Secondary | ICD-10-CM | POA: Diagnosis not present

## 2022-01-02 DIAGNOSIS — E875 Hyperkalemia: Secondary | ICD-10-CM | POA: Diagnosis not present

## 2022-01-02 DIAGNOSIS — I5042 Chronic combined systolic (congestive) and diastolic (congestive) heart failure: Secondary | ICD-10-CM | POA: Diagnosis not present

## 2022-01-02 DIAGNOSIS — I129 Hypertensive chronic kidney disease with stage 1 through stage 4 chronic kidney disease, or unspecified chronic kidney disease: Secondary | ICD-10-CM | POA: Diagnosis not present

## 2022-01-02 DIAGNOSIS — N189 Chronic kidney disease, unspecified: Secondary | ICD-10-CM | POA: Diagnosis not present

## 2022-01-02 DIAGNOSIS — E211 Secondary hyperparathyroidism, not elsewhere classified: Secondary | ICD-10-CM | POA: Diagnosis not present

## 2022-01-02 DIAGNOSIS — N184 Chronic kidney disease, stage 4 (severe): Secondary | ICD-10-CM | POA: Diagnosis not present

## 2022-01-15 ENCOUNTER — Telehealth (HOSPITAL_COMMUNITY): Payer: Self-pay | Admitting: *Deleted

## 2022-01-15 NOTE — Telephone Encounter (Signed)
Patient returning call regarding upcoming cardiac imaging study; pt verbalizes understanding of appt date/time, parking situation and where to check in, and verified current allergies; name and call back number provided for further questions should they arise ? ?Gordy Clement RN Navigator Cardiac Imaging ?[facility] ?[phone] office ?[phone] cell ? ?Denies claustrophobia but does report hx of knee replacement. State her veins are small. ?

## 2022-01-15 NOTE — Telephone Encounter (Signed)
Attempted to call patient regarding upcoming cardiac MRI appointment. Left message on voicemail with name and callback number  Denario Bagot RN Navigator Cardiac Imaging [facility] [phone] Office [phone] Cell  

## 2022-01-18 ENCOUNTER — Ambulatory Visit (HOSPITAL_COMMUNITY)
Admission: RE | Admit: 2022-01-18 | Discharge: 2022-01-18 | Disposition: A | Discharge status: Home / Self Care | Payer: Medicare Other | Source: Ambulatory Visit | Attending: Cardiology | Admitting: Cardiology

## 2022-01-18 DIAGNOSIS — I5032 Chronic diastolic (congestive) heart failure: Secondary | ICD-10-CM

## 2022-01-18 MED ORDER — GADOBUTROL 1 MMOL/ML IV SOLN
8.0000 mL | Freq: Once | INTRAVENOUS | Status: AC | PRN
  Administered 2022-01-18: 8 mL via INTRAVENOUS

## 2022-02-19 ENCOUNTER — Telehealth (HOSPITAL_COMMUNITY): Payer: Self-pay | Admitting: *Deleted

## 2022-02-22 ENCOUNTER — Telehealth (HOSPITAL_COMMUNITY): Payer: Self-pay | Admitting: *Deleted

## 2022-02-22 NOTE — Telephone Encounter (Signed)
Pt left vm stating she keeps getting yeast infections since she started farxiga. Pt asked if she could stop it and if so does she need to restart lasix.   Routed to [employer] (Dr.McLean is on vacation)

## 2022-02-22 NOTE — Telephone Encounter (Signed)
Pt aware and agreeable with plan. Pt said she has labs scheduled at her nephrologist and will have results sent to our office.

## 2022-03-10 ENCOUNTER — Ambulatory Visit (HOSPITAL_COMMUNITY)
Admission: RE | Admit: 2022-03-10 | Discharge: 2022-03-10 | Disposition: A | Discharge status: Home / Self Care | Payer: Medicare Other | Source: Ambulatory Visit | Attending: Cardiology | Admitting: Cardiology

## 2022-03-10 ENCOUNTER — Other Ambulatory Visit (HOSPITAL_COMMUNITY): Payer: Self-pay | Admitting: Cardiology

## 2022-03-10 ENCOUNTER — Encounter (HOSPITAL_COMMUNITY): Payer: Self-pay | Admitting: Cardiology

## 2022-03-10 ENCOUNTER — Encounter (HOSPITAL_COMMUNITY): Payer: Self-pay

## 2022-03-10 VITALS — BP 150/80 | HR 69 | Wt 165.0 lb

## 2022-03-10 DIAGNOSIS — E1122 Type 2 diabetes mellitus with diabetic chronic kidney disease: Secondary | ICD-10-CM | POA: Insufficient documentation

## 2022-03-10 DIAGNOSIS — I130 Hypertensive heart and chronic kidney disease with heart failure and stage 1 through stage 4 chronic kidney disease, or unspecified chronic kidney disease: Secondary | ICD-10-CM | POA: Insufficient documentation

## 2022-03-10 DIAGNOSIS — E875 Hyperkalemia: Secondary | ICD-10-CM | POA: Insufficient documentation

## 2022-03-10 DIAGNOSIS — N184 Chronic kidney disease, stage 4 (severe): Secondary | ICD-10-CM | POA: Insufficient documentation

## 2022-03-10 DIAGNOSIS — I493 Ventricular premature depolarization: Secondary | ICD-10-CM

## 2022-03-10 DIAGNOSIS — Z79899 Other long term (current) drug therapy: Secondary | ICD-10-CM | POA: Insufficient documentation

## 2022-03-10 DIAGNOSIS — R002 Palpitations: Secondary | ICD-10-CM | POA: Insufficient documentation

## 2022-03-10 DIAGNOSIS — I5032 Chronic diastolic (congestive) heart failure: Secondary | ICD-10-CM

## 2022-03-10 DIAGNOSIS — I5022 Chronic systolic (congestive) heart failure: Secondary | ICD-10-CM | POA: Insufficient documentation

## 2022-03-10 LAB — BASIC METABOLIC PANEL
Anion gap: 6 (ref 5–15)
BUN: 37 mg/dL — ABNORMAL HIGH (ref 8–23)
CO2: 23 mmol/L (ref 22–32)
Calcium: 9.3 mg/dL (ref 8.9–10.3)
Chloride: 112 mmol/L — ABNORMAL HIGH (ref 98–111)
Creatinine, Ser: 1.65 mg/dL — ABNORMAL HIGH (ref 0.44–1.00)
GFR, Estimated: 32 mL/min — ABNORMAL LOW (ref >60)
Glucose, Bld: 119 mg/dL — ABNORMAL HIGH (ref 70–99)
Potassium: 5 mmol/L (ref 3.5–5.1)
Sodium: 141 mmol/L (ref 135–145)

## 2022-03-10 LAB — BRAIN NATRIURETIC PEPTIDE: B Natriuretic Peptide: 124.4 pg/mL — ABNORMAL HIGH (ref 0.0–100.0)

## 2022-03-10 MED ORDER — ISOSORB DINITRATE-HYDRALAZINE 20-37.5 MG PO TABS: 1.0000 | ORAL_TABLET | Freq: Three times a day (TID) | ORAL | 3 refills | Status: DC

## 2022-03-10 NOTE — Progress Notes (Signed)
PCP: Manon Hilding, MD Cardiology: Dr. Domenic Polite HF Cardiology: Dr. Aundra Dubin  76 y.o. with history of CKD stage 4, chronic HF with mid range EF, type 2 diabetes, and HTN was referred by Dr. Domenic Polite for evaluation of possible cardiac amyloidosis.  Patient has had a long-standing mild cardiomyopathy, with most recent echo in 1/23 showing EF 45-50%, mild LV dilation, mild LVH, GLS -12.8% with apical sparing (?amyloidosis), normal RV, trivial pericardial effusion, moderate MR. She had a PYP scan in 2/23 that was equivocal with Grade 2, H/CL 1-1.5.  Remote ischemic workup was negative (Cardiolite in 2002).  Baseline creatinine is around 2.2. She is off Entresto and spironolactone with elevated creatinine and hyperkalemia.   Cardiolite in 4/23 showed no ischemia/infarction, PVCs noted.  [employer] gene testing for hereditary TTR cardiac amyloidosis was negative.  Cardiac MRI in 5/23 showed mild LV dilation with EF 37%, severe basal septal hypokinesis, no LVH, normal RV with RVEF 57%, diffuse mid wall LGE in the basal anteroseptum and basal inferoseptum, ECV 41% basal septum and 27% lateral wall.   She stopped [place] due to recurrent yeast infections.   Patient returns for followup of CHF.  She reports occasional palpitations, noticed most at night.  No significant exertional dyspnea with usual ADLs.  She does not take Lasix.  No chest pain.  She does not have a family history of sarcoidosis.  She has a sister who had "heart problems."  Weight down 4 lbs.   ECG (personally reviewed): NSR, PVC, poor RWP  Labs (2/23): K 5.7, creatinine 2.2, urine immunofixation negative, SPEP negative.  Labs (3/23): K 4.6 Labs (4/23): K 5.3, creatinine 2.2  PMH: 1. CKD stage 4 2. HTN 3. Depression 4. Gout  5. Type 2 diabetes 6. Chronic HF with mid range EF: Uncertain etiology. - Negative stress test around 2002 at [facility].  - Echo (9/14): EF 35-40% - Echo (1/22): EF 40-45% - Echo (1/23): EF 45-50%, mild LV  dilation, mild LVH, GLS -12.8% with apical sparing (?amyloidosis), normal RV, trivial pericardial effusion, moderate MR.  - PYP scan (2/23): Grade 2, H/CL 1-1.5 (equivocal). [employer] gene testing for hereditary TTR cardiac amyloidosis was negative.  - Cardiolite (4/23): no ischemia/infarction, PVCs noted. - Cardiac MRI (5/23): mild LV dilation with EF 37%, severe basal septal hypokinesis, no LVH, normal RV with RVEF 57%, diffuse mid wall LGE in the basal anteroseptum and basal inferoseptum, ECV 41% basal septum and 27% lateral wall.  7. Pulmonary nodules: CT chest at [facility] in 6/22 showed "innumerable" pulmonary nodules.   Social History   Socioeconomic History   Marital status: Married    Spouse name: Not on file   Number of children: Not on file   Years of education: Not on file   Highest education level: Not on file  Occupational History   Not on file  Tobacco Use   Smoking status: Never   Smokeless tobacco: Never  Vaping Use   Vaping Use: Never used  Substance and Sexual Activity   Alcohol use: Never    Alcohol/week: 0.0 standard drinks of alcohol   Drug use: Never   Sexual activity: Not on file  Other Topics Concern   Not on file  Social History Narrative   Not on file   Social Determinants of Health   Financial Resource Strain: Not on file  Food Insecurity: Not on file  Transportation Needs: Not on file  Physical Activity: Not on file  Stress: Not on file  Social Connections:  Not on file  Intimate Partner Violence: Not on file   Family History  Problem Relation Age of Onset   Kidney disease Sister    Heart disease Sister    Heart disease Brother    Heart failure Son    Heart disease Sister    ROS: All systems reviewed and negative except as per HPI.   Current Outpatient Medications  Medication Sig Dispense Refill   allopurinol (ZYLOPRIM) 100 MG tablet Take 150 mg by mouth every evening.      amLODipine (NORVASC) 5 MG tablet Take 5 mg by mouth daily.       carvedilol (COREG) 25 MG tablet Take 25 mg by mouth 2 (two) times daily with a meal.     clonazePAM (KLONOPIN) 0.5 MG tablet Take 0.5 mg by mouth every 8 (eight) hours as needed for anxiety.      fluticasone (FLONASE) 50 MCG/ACT nasal spray Place 1 spray into both nostrils daily.     Insulin Glargine (LANTUS [place]) Inject 30 Units into the skin daily.     isosorbide-hydrALAZINE (BIDIL) 20-37.5 MG tablet Take 1 tablet by mouth 3 (three) times daily. 200 tablet 3   lansoprazole (PREVACID) 30 MG capsule Take 30 mg by mouth daily.     lisinopril (ZESTRIL) 2.5 MG tablet Take 2.5 mg by mouth daily.     pravastatin (PRAVACHOL) 40 MG tablet Take 40 mg by mouth at bedtime.      No current facility-administered medications for this encounter.   BP (!) 150/80   Pulse 69   Wt 74.8 kg (165 lb)   SpO2 99%   BMI 26.63 kg/m  General: NAD Neck: No JVD, no thyromegaly or thyroid nodule.  Lungs: Clear to auscultation bilaterally with normal respiratory effort. CV: Nondisplaced PMI.  Heart regular S1/S2, no S3/S4, no murmur.  No peripheral edema.  No carotid bruit.  Normal pedal pulses.  Abdomen: Soft, nontender, no hepatosplenomegaly, no distention.  Skin: Intact without lesions or rashes.  Neurologic: Alert and oriented x 3.  Psych: Normal affect. Extremities: No clubbing or cyanosis.  HEENT: Normal.   Assessment/Plan: 1. 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  Cardiolite in 4/23 with no ischemia or infarction. Most recent echo in 1/23 showed EF 45-50%, mild LV dilation, mild LVH, GLS -12.8% with apical sparing (?amyloidosis), normal RV, trivial pericardial effusion, moderate MR. PYP scan in 2/23 was equivocal, gene testing for hereditary TTR amyloidosis was negative.  Cardiac MRI in 5/23 showed  mild LV dilation with EF 37%, severe basal septal hypokinesis, no LVH, normal RV with RVEF 57%, diffuse mid wall LGE in the basal anteroseptum and basal inferoseptum,  ECV 41% basal septum and 27% lateral wall. This was most suggestive of prior myocarditis or cardiac sarcoidosis, less likely cardiac amyloidosis.  I am concerned for possible sarcoidosis based on CT chest in 6/22 that showed "innumerable" pulmonary nodules.  She does not look volume overloaded on exam. NYHA class II. GDMT limited by CKD stage IV.  - I will send off ACE level and arrange for high resolution CT chest for evaluation for signs of pulmonary sarcoidosis.  I will also arrange for cardiac PET at [facility] to assess for signs of cardiac sarcoidosis.  - Continue Coreg 25 mg bid.  - Continue lisinopril 2.5 mg daily (would not increase with elevated creatinine and K). BMET today.  - She did not tolerate Farxiga due to yeast infections.  - Avoid spironolactone for now with hyperkalemia.  -  I will start her on Bidil 1 tab tid.  2. CKD stage IV: She had a tendency towards mild hyperkalemia.  BMET today.  3. HTN: BP elevated, starting Bidil as above.   4. PVCs: Patient has been noted to have PVCs and reports palpitations.  Could PVCs contribute to cardiomyopathy?  - I will arrange for 7 day Zio monitor to assess PVCs.    Followup in 2 months after the above testing is done.   Loralie Champagne 03/10/2022

## 2022-03-10 NOTE — Patient Instructions (Signed)
Start Bidil 1 tab Three times a day  Labs done today, your results will be available in MyChart, we will contact you for abnormal readings.  Your provider has recommended that  you wear a Zio Patch for 7 days.  This monitor will record your heart rhythm for our review.  IF you have any symptoms while wearing the monitor please press the button.  If you have any issues with the patch or you notice a red or orange light on it please call the company at [phone].  Once you remove the patch please mail it back to the company as soon as possible so we can get the results.  Non-Cardiac CT scanning, (CAT scanning), is a noninvasive, special x-ray that produces cross-sectional images of the body using x-rays and a computer. CT scans help physicians diagnose and treat medical conditions. For some CT exams, a contrast material is used to enhance visibility in the area of the body being studied. CT scans provide greater clarity and reveal more details than regular x-ray exams. ONCE APPROVED BY INSURANCE YOU WILL BE CALLED TO ARRANGE THE TEST  Your provider has recommended you have a Cardiac PET Scan at [facility]. We will get this approved with your insurance company and get it scheduled for you. We will call you with the date and time and instructions. [facility] will call you to review this information the day before the test. ONCE APPROVED BY INSURANCE YOU WILL BE CALLED TO ARRANGE THE TEST.  Your physician recommends that you schedule a follow-up appointment in: 2 months  If you have any questions or concerns before your next appointment please send [place] a message through [place] or call our office at [phone].    TO LEAVE A MESSAGE FOR THE NURSE SELECT OPTION 2, PLEASE LEAVE A MESSAGE INCLUDING: YOUR NAME DATE OF BIRTH CALL BACK NUMBER REASON FOR CALL**this is important as we prioritize the call backs  YOU WILL RECEIVE A CALL BACK THE SAME DAY AS LONG AS YOU CALL BEFORE 4:00 PM  At the [facility], you and your health needs are our priority. As part of our continuing mission to provide you with exceptional heart care, we have created designated Provider Care Teams. These Care Teams include your primary Cardiologist (physician) and Advanced Practice Providers (APPs- Physician Assistants and Nurse Practitioners) who all work together to provide you with the care you need, when you need it.   You may see any of the following providers on your designated Care Team at your next follow up: Dr Glori Bickers Dr Haynes Kerns, NP Lyda Jester, [place] [facility] [place], [place] Audry Riles, PharmD   Please be sure to bring in all your medications bottles to every appointment.

## 2022-03-10 NOTE — Progress Notes (Unsigned)
Zio patch placed onto patient.  All instructions and information reviewed with patient, they verbalize understanding with no questions. 

## 2022-03-11 ENCOUNTER — Ambulatory Visit: Payer: Medicare Other | Admitting: Cardiology

## 2022-03-11 ENCOUNTER — Encounter: Payer: Self-pay | Admitting: Cardiology

## 2022-03-11 VITALS — BP 100/64 | HR 75 | Ht 66.5 in | Wt 167.0 lb

## 2022-03-11 DIAGNOSIS — I5020 Unspecified systolic (congestive) heart failure: Secondary | ICD-10-CM

## 2022-03-11 DIAGNOSIS — I10 Essential (primary) hypertension: Secondary | ICD-10-CM | POA: Diagnosis not present

## 2022-03-11 DIAGNOSIS — N1832 Chronic kidney disease, stage 3b: Secondary | ICD-10-CM | POA: Diagnosis not present

## 2022-03-11 DIAGNOSIS — I493 Ventricular premature depolarization: Secondary | ICD-10-CM

## 2022-03-11 LAB — ANGIOTENSIN CONVERTING ENZYME: Angiotensin-Converting Enzyme: 10 U/L — ABNORMAL LOW (ref 14–82)

## 2022-03-11 NOTE — Progress Notes (Signed)
Cardiology Office Note  Date: 03/11/2022   ID: LARSEN ZETTEL, DOB 1946-04-13, MRN [id number]  PCP:  Manon Hilding, MD  Cardiologist:  Rozann Lesches, MD Electrophysiologist:  None   Chief Complaint  Patient presents with   Cardiac follow-up    History of Present Illness: Kayla Bell is a 76 y.o. female last seen in December 2022.  She has had interval follow-up in the heart failure clinic, I reviewed the note from June 28 by Dr. Aundra Dubin.  I reviewed her medications including recent adjustments, she picks up the BiDil prescription today.  She is wearing a Zio patch which was placed for quantification of PVCs.  Also scheduled to undergo cardiac PET imaging at [facility] for evaluation of possible sarcoidosis.  She has NYHA class II dyspnea at this time, weight has been stable.  No unexplained syncope.  In terms of GDMT, she is not on MRA due to propensity to hyperkalemia.  She was not able to tolerate [place] due to recurrent yeast infections.  Past Medical History:  Diagnosis Date   Anemia    Anxiety    Arthritis    Cardiomyopathy ([place])    Chronic kidney disease, stage 3 (HCC)    Depression    Essential hypertension    GERD (gastroesophageal reflux disease)    Gout    Hyperlipidemia    Type 2 diabetes mellitus ([place])     Past Surgical History:  Procedure Laterality Date   BREAST SURGERY Left    CHOLECYSTECTOMY     EYE SURGERY Bilateral    PARTIAL HYSTERECTOMY     RIGHT ARM SURGERY Right    TOTAL KNEE ARTHROPLASTY Right 06/07/2019   Procedure: TOTAL KNEE ARTHROPLASTY;  Surgeon: Paralee Cancel, MD;  Location: [facility];  Service: Orthopedics;  Laterality: Right;  70 mins    Current Outpatient Medications  Medication Sig Dispense Refill   allopurinol (ZYLOPRIM) 100 MG tablet Take 150 mg by mouth every evening.      amLODipine (NORVASC) 5 MG tablet Take 5 mg by mouth daily.      carvedilol (COREG) 25 MG tablet Take 25 mg by mouth 2 (two) times daily with a meal.      clonazePAM (KLONOPIN) 0.5 MG tablet Take 0.5 mg by mouth every 8 (eight) hours as needed for anxiety.      fluticasone (FLONASE) 50 MCG/ACT nasal spray Place 1 spray into both nostrils daily.     Insulin Glargine (LANTUS [place]) Inject 30 Units into the skin daily.     isosorbide-hydrALAZINE (BIDIL) 20-37.5 MG tablet Take 1 tablet by mouth 3 (three) times daily. 200 tablet 3   lansoprazole (PREVACID) 30 MG capsule Take 30 mg by mouth daily.     lisinopril (ZESTRIL) 2.5 MG tablet Take 2.5 mg by mouth daily.     pravastatin (PRAVACHOL) 40 MG tablet Take 40 mg by mouth at bedtime.      No current facility-administered medications for this visit.   Allergies:  Penicillins   ROS: No orthopnea or PND.  No leg swelling.  Physical Exam: VS:  BP 100/64   Pulse 75   Ht 5' 6.5" (1.689 m)   Wt 167 lb (75.8 kg)   SpO2 97%   BMI 26.55 kg/m , BMI Body mass index is 26.55 kg/m.  Wt Readings from Last 3 Encounters:  03/11/22 167 lb (75.8 kg)  03/10/22 165 lb (74.8 kg)  12/25/21 169 lb (76.7 kg)    General: Patient appears comfortable at  rest. HEENT: Conjunctiva and lids normal, oropharynx clear. Neck: Supple, no elevated JVP or carotid bruits, no thyromegaly. Lungs: Clear to auscultation, nonlabored breathing at rest. Cardiac: Regular rate and rhythm, no S3 or significant systolic murmur, no pericardial rub. Extremities: No pitting edema.  ECG:  An ECG dated 03/10/2022 was personally reviewed today and demonstrated:  Sinus rhythm with PVC, decreased R wave progression.  Recent Labwork: 12/09/2021: Hemoglobin 11.3; Platelets 249 03/10/2022: B Natriuretic Peptide 124.4; BUN 37; Creatinine, Ser 1.65; Potassium 5.0; Sodium 141   Other Studies Reviewed Today:  Echocardiogram 10/13/2021:  1. Left ventricular ejection fraction, by estimation, is 45 to 50%. The  left ventricle has mildly decreased function. The left ventricle  demonstrates regional wall motion abnormalities (see scoring   diagram/findings for description). The left ventricular   internal cavity size was mildly dilated. There is mild left ventricular  hypertrophy. Left ventricular diastolic parameters are consistent with  Grade I diastolic dysfunction (impaired relaxation). Elevated left  ventricular end-diastolic pressure.  Abnormal global longitudinal strain at -12.8% with apparent apical sparing  (consider evaluation for amyloidosis).   2. Right ventricular systolic function is normal. The right ventricular  size is normal. There is normal pulmonary artery systolic pressure. The  estimated right ventricular systolic pressure is 70.6 mmHg.   3. Left atrial size was mildly dilated.   4. There is a trivial pericardial effusion posterior to the left  ventricle.   5. The mitral valve is grossly normal. Moderate mitral valve  regurgitation.   6. The aortic valve is tricuspid. Aortic valve regurgitation is not  visualized. Aortic valve sclerosis/calcification is present, without any  evidence of aortic stenosis. Aortic valve mean gradient measures 4.0 mmHg.   7. The inferior vena cava is normal in size with greater than 50%  respiratory variability, suggesting right atrial pressure of 3 mmHg.   PYP scan to 10/23/2021:   By semi-quantitative assessment scan is consistent with heart uptake equal to rib uptake-Grade 2. Heart to contralateral lung ratio is between 1-1.5, indeterminate for amyloid.   Study is equivocal for TTR amyloidosis (visual score of 2/ratio between 1-1.5).   Prior study not available for comparison.  Lexiscan Myoview 12/25/2021:   Findings are consistent with no prior ischemia. The study is intermediate risk due to LV dysfunction.   No ST deviation was noted from Baseline EKG.  PVCs were noted during infusion.   LV perfusion is abnormal. There is no evidence of ischemia. There is no evidence of infarction. Defect 1: There is a small defect with subtle reduction in uptake present in the apical  apex location(s) that is fixed. There is global LV hypokinesis with no focal wall abnormality in the apex. Consistent with artifact caused by breast attenuation.   Left ventricular function is normal. Nuclear stress EF: 31 %. The left ventricular ejection fraction is moderately decreased (30-44%). End diastolic cavity size is moderately enlarged. End systolic cavity size is moderately enlarged.  Cardiac MRI 01/18/2022: IMPRESSION: 1. Mildly dilated LV with severe basal septal hypokinesis and EF 37%. No LV hypertrophy.   2.  Normal RV size and systolic function, EF 23%.   3. Delayed enhancement showed diffuse mid-wall LGE in the basal anteroseptal wall segment and the basal inferoseptal wall segment.   4. Abnormal T1 signal in the basal septal wall with extracellular volume percentage elevated at 41%. Normal T1 signal in the lateral wall with ECV 27%.   5.  Normal T2 signal in the septal wall.   Nonischemic  cardiomyopathy pattern. The LGE pattern is not classic for cardiac sarcoidosis (no subepicardial or transmural involvement), but sarcoidosis often involves the basal septum. Prior myocarditis is a possibility. The LGE pattern is also not classic for cardiac amyloidosis. ECV percentage is high in the basal septum where there is LGE, but ECV is in normal range in the lateral wall (not diffuse ECV percentage elevation).  Assessment and Plan:  1.  HFrEF, LVEF 37% by cardiac MRI in May.  There is concern for possible cardiac sarcoidosis and she is scheduled to undergo cardiac PET at [facility] for further evaluation.  Previous work-up for amyloidosis was unrevealing.  Still possibility of previous myocarditis.  Plan to continue Coreg, lisinopril, and recent addition of BiDil.  GDMT otherwise limited as discussed above.  Keep follow-up in the heart failure clinic and we will see her thereafter.  2. PVCs, currently wearing Zio patch for quantification.  3.  Essential hypertension, also on  Norvasc.  If blood pressure comes down on BiDil, would reduce or discontinue Norvasc to allow titration.  4.  CKD stage 3b, creatinine most recently 1.65.  Medication Adjustments/Labs and Tests Ordered: Current medicines are reviewed at length with the patient today.  Concerns regarding medicines are outlined above.   Tests Ordered: No orders of the defined types were placed in this encounter.   Medication Changes: No orders of the defined types were placed in this encounter.   Disposition:  Follow up  4 months.  Signed, Satira Sark, MD, [facility] 03/11/2022 4:04 PM    [facility] [place], [place], [place] [address] Phone: [phone]; Fax: [phone]

## 2022-03-11 NOTE — Patient Instructions (Signed)
Medication Instructions:   Your physician recommends that you continue on your current medications as directed. Please refer to the Current Medication list given to you today.  Labwork:  none  Testing/Procedures:  none  Follow-Up:  Your physician recommends that you schedule a follow-up appointment in: 4 months.   Any Other Special Instructions Will Be Listed Below (If Applicable).  If you need a refill on your cardiac medications before your next appointment, please call your pharmacy. 

## 2022-03-19 DIAGNOSIS — E211 Secondary hyperparathyroidism, not elsewhere classified: Secondary | ICD-10-CM | POA: Diagnosis not present

## 2022-03-19 DIAGNOSIS — E875 Hyperkalemia: Secondary | ICD-10-CM | POA: Diagnosis not present

## 2022-03-19 DIAGNOSIS — I129 Hypertensive chronic kidney disease with stage 1 through stage 4 chronic kidney disease, or unspecified chronic kidney disease: Secondary | ICD-10-CM | POA: Diagnosis not present

## 2022-03-19 DIAGNOSIS — R809 Proteinuria, unspecified: Secondary | ICD-10-CM | POA: Diagnosis not present

## 2022-03-19 DIAGNOSIS — N189 Chronic kidney disease, unspecified: Secondary | ICD-10-CM | POA: Diagnosis not present

## 2022-03-19 DIAGNOSIS — N184 Chronic kidney disease, stage 4 (severe): Secondary | ICD-10-CM | POA: Diagnosis not present

## 2022-03-19 DIAGNOSIS — D631 Anemia in chronic kidney disease: Secondary | ICD-10-CM | POA: Diagnosis not present

## 2022-03-19 DIAGNOSIS — I5020 Unspecified systolic (congestive) heart failure: Secondary | ICD-10-CM | POA: Diagnosis not present

## 2022-03-23 ENCOUNTER — Telehealth (HOSPITAL_COMMUNITY): Payer: Self-pay | Admitting: Vascular Surgery

## 2022-03-23 NOTE — Telephone Encounter (Signed)
Left pt message giving CT APPT 7/19@ 930

## 2022-03-24 DIAGNOSIS — I493 Ventricular premature depolarization: Secondary | ICD-10-CM | POA: Diagnosis not present

## 2022-03-25 DIAGNOSIS — E211 Secondary hyperparathyroidism, not elsewhere classified: Secondary | ICD-10-CM | POA: Diagnosis not present

## 2022-03-25 DIAGNOSIS — R809 Proteinuria, unspecified: Secondary | ICD-10-CM | POA: Diagnosis not present

## 2022-03-25 DIAGNOSIS — D631 Anemia in chronic kidney disease: Secondary | ICD-10-CM | POA: Diagnosis not present

## 2022-03-25 DIAGNOSIS — I129 Hypertensive chronic kidney disease with stage 1 through stage 4 chronic kidney disease, or unspecified chronic kidney disease: Secondary | ICD-10-CM | POA: Diagnosis not present

## 2022-03-25 DIAGNOSIS — N184 Chronic kidney disease, stage 4 (severe): Secondary | ICD-10-CM | POA: Diagnosis not present

## 2022-03-25 DIAGNOSIS — N189 Chronic kidney disease, unspecified: Secondary | ICD-10-CM | POA: Diagnosis not present

## 2022-03-25 DIAGNOSIS — I5042 Chronic combined systolic (congestive) and diastolic (congestive) heart failure: Secondary | ICD-10-CM | POA: Diagnosis not present

## 2022-03-30 DIAGNOSIS — E1165 Type 2 diabetes mellitus with hyperglycemia: Secondary | ICD-10-CM | POA: Diagnosis not present

## 2022-03-30 DIAGNOSIS — E7800 Pure hypercholesterolemia, unspecified: Secondary | ICD-10-CM | POA: Diagnosis not present

## 2022-03-30 DIAGNOSIS — E7849 Other hyperlipidemia: Secondary | ICD-10-CM | POA: Diagnosis not present

## 2022-03-30 DIAGNOSIS — I10 Essential (primary) hypertension: Secondary | ICD-10-CM | POA: Diagnosis not present

## 2022-03-30 DIAGNOSIS — E1122 Type 2 diabetes mellitus with diabetic chronic kidney disease: Secondary | ICD-10-CM | POA: Diagnosis not present

## 2022-03-30 DIAGNOSIS — N183 Chronic kidney disease, stage 3 unspecified: Secondary | ICD-10-CM | POA: Diagnosis not present

## 2022-03-30 DIAGNOSIS — E782 Mixed hyperlipidemia: Secondary | ICD-10-CM | POA: Diagnosis not present

## 2022-03-31 ENCOUNTER — Ambulatory Visit (HOSPITAL_COMMUNITY)
Admission: RE | Admit: 2022-03-31 | Discharge: 2022-03-31 | Disposition: A | Discharge status: Home / Self Care | Payer: Medicare Other | Source: Ambulatory Visit | Attending: Cardiology | Admitting: Cardiology

## 2022-03-31 DIAGNOSIS — I700 Atherosclerosis of aorta: Secondary | ICD-10-CM | POA: Diagnosis not present

## 2022-03-31 DIAGNOSIS — I5032 Chronic diastolic (congestive) heart failure: Secondary | ICD-10-CM

## 2022-03-31 DIAGNOSIS — R918 Other nonspecific abnormal finding of lung field: Secondary | ICD-10-CM | POA: Diagnosis not present

## 2022-03-31 DIAGNOSIS — I2510 Atherosclerotic heart disease of native coronary artery without angina pectoris: Secondary | ICD-10-CM | POA: Diagnosis not present

## 2022-04-02 ENCOUNTER — Telehealth (HOSPITAL_COMMUNITY): Payer: Self-pay

## 2022-04-02 DIAGNOSIS — I5022 Chronic systolic (congestive) heart failure: Secondary | ICD-10-CM

## 2022-04-02 NOTE — Telephone Encounter (Addendum)
Pt aware, agreeable, and verbalized understanding  Referral placed   ----- Message from Larey Dresser, MD sent at 04/02/2022 12:13 PM EDT ----- Innumerable bilateral pulmonary nodules.  Stable since prior study so less likely to be cancer.  However, would consider pulmonary sarcoidosis.  She needs evaluation by pulmonary, please arrange consultation.

## 2022-04-07 DIAGNOSIS — I130 Hypertensive heart and chronic kidney disease with heart failure and stage 1 through stage 4 chronic kidney disease, or unspecified chronic kidney disease: Secondary | ICD-10-CM | POA: Diagnosis not present

## 2022-04-07 DIAGNOSIS — I119 Hypertensive heart disease without heart failure: Secondary | ICD-10-CM | POA: Diagnosis not present

## 2022-04-07 DIAGNOSIS — E11649 Type 2 diabetes mellitus with hypoglycemia without coma: Secondary | ICD-10-CM | POA: Diagnosis not present

## 2022-04-07 DIAGNOSIS — D509 Iron deficiency anemia, unspecified: Secondary | ICD-10-CM | POA: Diagnosis not present

## 2022-04-07 DIAGNOSIS — M109 Gout, unspecified: Secondary | ICD-10-CM | POA: Diagnosis not present

## 2022-04-07 DIAGNOSIS — I5022 Chronic systolic (congestive) heart failure: Secondary | ICD-10-CM | POA: Diagnosis not present

## 2022-04-07 DIAGNOSIS — I43 Cardiomyopathy in diseases classified elsewhere: Secondary | ICD-10-CM | POA: Diagnosis not present

## 2022-04-07 DIAGNOSIS — E1122 Type 2 diabetes mellitus with diabetic chronic kidney disease: Secondary | ICD-10-CM | POA: Diagnosis not present

## 2022-04-07 DIAGNOSIS — I129 Hypertensive chronic kidney disease with stage 1 through stage 4 chronic kidney disease, or unspecified chronic kidney disease: Secondary | ICD-10-CM | POA: Diagnosis not present

## 2022-04-07 DIAGNOSIS — E7849 Other hyperlipidemia: Secondary | ICD-10-CM | POA: Diagnosis not present

## 2022-04-07 DIAGNOSIS — I10 Essential (primary) hypertension: Secondary | ICD-10-CM | POA: Diagnosis not present

## 2022-04-12 ENCOUNTER — Encounter: Payer: Self-pay | Admitting: Internal Medicine

## 2022-04-12 ENCOUNTER — Ambulatory Visit: Payer: Medicare Other | Admitting: Internal Medicine

## 2022-04-12 VITALS — BP 138/78 | HR 78 | Temp 98.4°F | Ht 66.0 in | Wt 167.4 lb

## 2022-04-12 DIAGNOSIS — R918 Other nonspecific abnormal finding of lung field: Secondary | ICD-10-CM

## 2022-04-12 DIAGNOSIS — I10 Essential (primary) hypertension: Secondary | ICD-10-CM

## 2022-04-12 DIAGNOSIS — R9389 Abnormal findings on diagnostic imaging of other specified body structures: Secondary | ICD-10-CM | POA: Diagnosis not present

## 2022-04-12 MED ORDER — VALSARTAN 40 MG PO TABS: ORAL_TABLET | ORAL | 2 refills | Status: DC

## 2022-04-12 NOTE — Assessment & Plan Note (Addendum)
D/c ACEi 04/12/2022  Due to cough/ pnds   In the best review of chronic cough to date ( NEJM 2016 375 [id number]) ,  ACEi are now felt to cause cough in up to  20% of pts which is a 4 fold increase from previous reports and does not include the variety of non-specific complaints we see in pulmonary clinic in pts on ACEi but previously attributed to another dx like  Copd/asthma and  include PNDS, throat and chest congestion, "bronchitis", unexplained dyspnea and noct "strangling" sensations, and hoarseness, but also  atypical /refractory GERD symptoms like dysphagia and "bad heartburn"   The only way I know  to prove this is not an "ACEi Case" is a trial off ACEi x a minimum of 6 weeks then regroup.   In her case due to CRI will need repeat creatinine/ K p taking x 10 days so should return here for this and monitor bp closely in meantime/ advised.    Medical decision making was a moderately high  level of complexity in this case because of a chronic condition/problems of unknown origin  And  ,  directly observing portions of ambulatory 02 saturation study/ and generating customized AVS unique to this office visit / new pt eval.                 Each maintenance medication was reviewed in detail including emphasizing most importantly the difference between maintenance and prns and under what circumstances the prns are to be triggered using an action plan format where appropriate. Please see avs for details which were reviewed in writing by both me and my nurse and patient given a written copy highlighted where appropriate with yellow highlighter for the patient's continued care at home along with an updated version of their medications.  Patient was asked to maintain medication reconciliation by comparing this list to the actual medications being used at home and to contact this office right away if there is a conflict or discrepancy.    see avs for instructions unique to this ov

## 2022-04-12 NOTE — Progress Notes (Signed)
Kayla Bell, female    DOB: Dec 31, 1945    MRN: [id number]   Brief patient profile:  64  yobf  never smoker  referred to pulmonary clinic in [place]  04/12/2022 by Dr Aundra Dubin  for MPNs and chronic cough.   History of Present Illness  04/12/2022  Pulmonary/ 1st office eval/ Gerlad Pelzel / [place] [facility]  - on ACEi  Chief Complaint  Patient presents with   Consult    Consult for abnormal CT done at [facility]   Dyspnea:  slowed by R knee ( s/p TKR 3 y  prior to [place] ) more tired than sob with grocery shopping  Cough: x years, dry and with temp changes ( a/c will do it) / assoc nasal congestion  Sleep: bed is flat/ 2 pillows  SABA use: none   No obvious day to day or daytime pattern/variability or assoc excess/ purulent sputum or mucus plugs or hemoptysis or cp or chest tightness, subjective wheeze or overt sinus or hb symptoms.   Sleeping as above  without nocturnal  or early am exacerbation  of respiratory  c/o's or need for noct saba. Also denies any obvious fluctuation of symptoms with weather or environmental changes or other aggravating or alleviating factors except as outlined above   No unusual exposure hx or h/o childhood pna/ asthma or knowledge of premature birth.  Current Allergies, Complete Past Medical History, Past Surgical History, Family History, and Social History were reviewed in [employer] record.  ROS  The following are not active complaints unless bolded Hoarseness, sore throat, dysphagia, dental problems, itching, sneezing,  nasal congestion or discharge of excess mucus or purulent secretions, ear ache,   fever, chills, sweats, unintended wt loss or wt gain, classically pleuritic or exertional cp,  orthopnea pnd or arm/hand swelling  or leg swelling, presyncope, palpitations, abdominal pain, anorexia, nausea, vomiting, diarrhea  or change in bowel habits or change in bladder habits, change in stools or change in urine, dysuria, hematuria,  rash,  arthralgias, visual complaints, headache, numbness, weakness or ataxia or problems with walking or coordination,  change in mood or  memory.           Past Medical History:  Diagnosis Date   Anemia    Anxiety    Arthritis    Cardiomyopathy ([place])    Chronic kidney disease, stage 3 (HCC)    Depression    Essential hypertension    GERD (gastroesophageal reflux disease)    Gout    Hyperlipidemia    Type 2 diabetes mellitus ([place])     Outpatient Medications Prior to Visit  Medication Sig Dispense Refill   allopurinol (ZYLOPRIM) 100 MG tablet Take 150 mg by mouth every evening.      amLODipine (NORVASC) 5 MG tablet Take 5 mg by mouth daily.      carvedilol (COREG) 25 MG tablet Take 25 mg by mouth 2 (two) times daily with a meal.     clonazePAM (KLONOPIN) 0.5 MG tablet Take 0.5 mg by mouth every 8 (eight) hours as needed for anxiety.      fluticasone (FLONASE) 50 MCG/ACT nasal spray Place 1 spray into both nostrils daily.     Insulin Glargine (LANTUS [place]) Inject 30 Units into the skin daily.     isosorbide-hydrALAZINE (BIDIL) 20-37.5 MG tablet Take 1 tablet by mouth 3 (three) times daily. 200 tablet 3   lansoprazole (PREVACID) 30 MG capsule Take 30 mg by mouth daily.     lisinopril (  ZESTRIL) 2.5 MG tablet Take 2.5 mg by mouth daily.     pravastatin (PRAVACHOL) 40 MG tablet Take 40 mg by mouth at bedtime.      No facility-administered medications prior to visit.     Objective:     BP 138/78 (BP Location: Left Arm, Patient Position: Sitting)   Pulse 78   Temp 98.4 F (36.9 C) (Temporal)   Ht 5\' 6"  (1.676 m)   Wt 167 lb 6.4 oz (75.9 kg)   SpO2 99% Comment: ra  BMI 27.02 kg/m   SpO2: 99 % (ra)  Amb pleasant bf nad   HEENT : Oropharynx clear     Nasal turbinates nl    NECK :  without  apparent JVD/ palpable Nodes/TM    LUNGS: no acc muscle use,  Nl contour chest which is clear to A and P bilaterally without cough on insp or exp maneuvers   CV:  RRR  no s3 or murmur or  increase in P2, and no edema   ABD:  soft and nontender with nl inspiratory excursion in the supine position. No bruits or organomegaly appreciated   MS:  Nl gait/ ext warm without deformities Or obvious joint restrictions  calf tenderness, cyanosis or clubbing    SKIN: warm and dry without lesions    NEURO:  alert, approp, nl sensorium with  no motor or cerebellar deficits apparent.   I personally reviewed images and agree with radiology impression as follows:   Chest HRCT  03/31/22  Innumerable bilateral pulmonary nodules are again seen, unchanged compared to prior examination and measuring up to 0.8 cm. These nodules are nonspecific, although interval stability is very reassuring for benign etiology. General differential considerations include sequela of prior atypical infection, pulmonary sarcoidosis, and less common etiologies such as DIPNECH. 2. Cardiomegaly and coronary artery disease         Assessment   No problem-specific Assessment & Plan notes found for this encounter.     Christinia Gully, MD 04/12/2022

## 2022-04-12 NOTE — Assessment & Plan Note (Signed)
Never smoker - 1st noted 02/25/21 up to 9 mm - repeat ct 03/31/22 up to  8 mm with Neg ACE study  - 04/12/2022   Walked on RA  x  3  lap(s) =  approx 450  ft  @ fast pace, stopped due to end of study with lowest 02 sats 97% and no sob   Very likely benign / inflammatory with no evidence of systemic sarcoid clinically and PET scan pending but will not likely shed light on such small nodules   Discussed in detail all the  indications, usual  risks and alternatives  relative to the benefits with patient who agrees to proceed with conservative f/u with re-eval p PET if Pos and o/w return in 3 months

## 2022-04-12 NOTE — Patient Instructions (Addendum)
Stop lisinopril and start valsartan 40 mg one daily in its place and your upper airway symptoms should resolve within 6 weeks  - we will need to recheck your BMET at end of next week here  Call me the day after you have done the PET scan so I can review it  Please schedule a follow up visit in 3 months but call sooner if needed

## 2022-04-20 DIAGNOSIS — R9389 Abnormal findings on diagnostic imaging of other specified body structures: Secondary | ICD-10-CM | POA: Diagnosis not present

## 2022-04-21 LAB — BASIC METABOLIC PANEL
BUN/Creatinine Ratio: 24 (ref 12–28)
BUN: 45 mg/dL — ABNORMAL HIGH (ref 8–27)
CO2: 19 mmol/L — ABNORMAL LOW (ref 20–29)
Calcium: 9.4 mg/dL (ref 8.7–10.3)
Chloride: 107 mmol/L — ABNORMAL HIGH (ref 96–106)
Creatinine, Ser: 1.86 mg/dL — ABNORMAL HIGH (ref 0.57–1.00)
Glucose: 58 mg/dL — ABNORMAL LOW (ref 70–99)
Potassium: 4.8 mmol/L (ref 3.5–5.2)
Sodium: 142 mmol/L (ref 134–144)
eGFR: 28 mL/min/{1.73_m2} — ABNORMAL LOW (ref >59)

## 2022-05-12 ENCOUNTER — Encounter (HOSPITAL_COMMUNITY): Payer: Self-pay | Admitting: Cardiology

## 2022-05-12 ENCOUNTER — Ambulatory Visit (HOSPITAL_COMMUNITY)
Admission: RE | Admit: 2022-05-12 | Discharge: 2022-05-12 | Disposition: A | Discharge status: Home / Self Care | Payer: Medicare Other | Source: Ambulatory Visit | Attending: Cardiology | Admitting: Cardiology

## 2022-05-12 VITALS — BP 140/70 | HR 66 | Wt 165.2 lb

## 2022-05-12 DIAGNOSIS — I5022 Chronic systolic (congestive) heart failure: Secondary | ICD-10-CM

## 2022-05-12 DIAGNOSIS — I5032 Chronic diastolic (congestive) heart failure: Secondary | ICD-10-CM | POA: Insufficient documentation

## 2022-05-12 LAB — BASIC METABOLIC PANEL
Anion gap: 8 (ref 5–15)
BUN: 45 mg/dL — ABNORMAL HIGH (ref 8–23)
CO2: 21 mmol/L — ABNORMAL LOW (ref 22–32)
Calcium: 9.3 mg/dL (ref 8.9–10.3)
Chloride: 112 mmol/L — ABNORMAL HIGH (ref 98–111)
Creatinine, Ser: 1.91 mg/dL — ABNORMAL HIGH (ref 0.44–1.00)
GFR, Estimated: 27 mL/min — ABNORMAL LOW (ref >60)
Glucose, Bld: 61 mg/dL — ABNORMAL LOW (ref 70–99)
Potassium: 4.5 mmol/L (ref 3.5–5.1)
Sodium: 141 mmol/L (ref 135–145)

## 2022-05-12 LAB — BRAIN NATRIURETIC PEPTIDE: B Natriuretic Peptide: 188.3 pg/mL — ABNORMAL HIGH (ref 0.0–100.0)

## 2022-05-12 NOTE — Patient Instructions (Signed)
STOP Amlodipine  Labs done today, your results will be available in MyChart, we will contact you for abnormal readings.  Please follow up with our heart failure pharmacist in 3 weeks  Your physician recommends that you schedule a follow-up appointment in: 3 months.  If you have any questions or concerns before your next appointment please send [place] a message through [place] or call our office at [phone].    TO LEAVE A MESSAGE FOR THE NURSE SELECT OPTION 2, PLEASE LEAVE A MESSAGE INCLUDING: YOUR NAME DATE OF BIRTH CALL BACK NUMBER REASON FOR CALL**this is important as we prioritize the call backs  YOU WILL RECEIVE A CALL BACK THE SAME DAY AS LONG AS YOU CALL BEFORE 4:00 PM  At the [facility], you and your health needs are our priority. As part of our continuing mission to provide you with exceptional heart care, we have created designated Provider Care Teams. These Care Teams include your primary Cardiologist (physician) and Advanced Practice Providers (APPs- Physician Assistants and Nurse Practitioners) who all work together to provide you with the care you need, when you need it.   You may see any of the following providers on your designated Care Team at your next follow up: Dr Glori Bickers Dr Loralie Champagne Dr. Roxana Hires, NP Lyda Jester, [place] [facility] [place], [place] Forestine Na, NP Audry Riles, PharmD   Please be sure to bring in all your medications bottles to every appointment.

## 2022-05-13 NOTE — Progress Notes (Signed)
PCP: Manon Hilding, MD Cardiology: Dr. Domenic Polite HF Cardiology: Dr. Aundra Dubin  76 y.o. with history of CKD stage 4, chronic HF with mid range EF, type 2 diabetes, and HTN was referred by Dr. Domenic Polite for evaluation of possible cardiac amyloidosis.  Patient has had a long-standing mild cardiomyopathy, with most recent echo in 1/23 showing EF 45-50%, mild LV dilation, mild LVH, GLS -12.8% with apical sparing (?amyloidosis), normal RV, trivial pericardial effusion, moderate MR. She had a PYP scan in 2/23 that was equivocal with Grade 2, H/CL 1-1.5.  Remote ischemic workup was negative (Cardiolite in 2002).  Baseline creatinine is around 2.2. She is off Entresto and spironolactone with elevated creatinine and hyperkalemia.   Cardiolite in 4/23 showed no ischemia/infarction, PVCs noted.  [employer] gene testing for hereditary TTR cardiac amyloidosis was negative.  Cardiac MRI in 5/23 showed mild LV dilation with EF 37%, severe basal septal hypokinesis, no LVH, normal RV with RVEF 57%, diffuse mid wall LGE in the basal anteroseptum and basal inferoseptum, ECV 41% basal septum and 27% lateral wall.   She stopped [place] due to recurrent yeast infections.   She had repeat HRCT in 7/23 again showing innumerable small nodules.  They were stable and likely benign.  She saw Dr. Melvyn Novas who did not see definite signs of pulmonary sarcoidosis.  ACEI was stopped and ARB begun.   Patient returns for followup of CHF.  Weight stable.  She has not taken any of her meds yet today.   She gets lightheaded when she takes Bidil.  No falls.  No dyspnea walking on flat ground.  She does get short of breath walking up a hill or stairs.  No orthopnea/PND.  No chest pain.    ECG (personally reviewed): NSR, PVC  Labs (2/23): K 5.7, creatinine 2.2, urine immunofixation negative, SPEP negative.  Labs (3/23): K 4.6 Labs (4/23): K 5.3, creatinine 2.2 Labs (6/23): ACE level normal, BNP 124 Labs (8/23): K 4.8, creatinine 1.86  PMH: 1.  CKD stage 4 2. HTN 3. Depression 4. Gout  5. Type 2 diabetes 6. Chronic HF with mid range EF: Uncertain etiology. - Negative stress test around 2002 [facility].  - Echo (9/14): EF 35-40% - Echo (1/22): EF 40-45% - Echo (1/23): EF 45-50%, mild LV dilation, mild LVH, GLS -12.8% with apical sparing (?amyloidosis), normal RV, trivial pericardial effusion, moderate MR.  - PYP scan (2/23): Grade 2, H/CL 1-1.5 (equivocal). [employer] gene testing for hereditary TTR cardiac amyloidosis was negative.  - Cardiolite (4/23): no ischemia/infarction, PVCs noted. - Cardiac MRI (5/23): mild LV dilation with EF 37%, severe basal septal hypokinesis, no LVH, normal RV with RVEF 57%, diffuse mid wall LGE in the basal anteroseptum and basal inferoseptum, ECV 41% basal septum and 27% lateral wall.  7. Pulmonary nodules: CT chest at [facility] in 6/22 showed "innumerable" pulmonary nodules.  - HRCT (7/23): Innumerable small nodules, stable and likely benign.  8. PVCs: Zio monitor (7/23) with 9% PVCs  Social History   Socioeconomic History   Marital status: Married    Spouse name: Not on file   Number of children: Not on file   Years of education: Not on file   Highest education level: Not on file  Occupational History   Not on file  Tobacco Use   Smoking status: Never   Smokeless tobacco: Never  Vaping Use   Vaping Use: Never used  Substance and Sexual Activity   Alcohol use: Never    Alcohol/week: 0.0  standard drinks of alcohol   Drug use: Never   Sexual activity: Not on file  Other Topics Concern   Not on file  Social History Narrative   Not on file   Social Determinants of Health   Financial Resource Strain: Not on file  Food Insecurity: Not on file  Transportation Needs: Not on file  Physical Activity: Not on file  Stress: Not on file  Social Connections: Not on file  Intimate Partner Violence: Not on file   Family History  Problem Relation Age of Onset   Kidney disease  Sister    Heart disease Sister    Heart disease Brother    Heart failure Son    Heart disease Sister    ROS: All systems reviewed and negative except as per HPI.   Current Outpatient Medications  Medication Sig Dispense Refill   allopurinol (ZYLOPRIM) 100 MG tablet Take 150 mg by mouth every evening.      carvedilol (COREG) 25 MG tablet Take 25 mg by mouth 2 (two) times daily with a meal.     clonazePAM (KLONOPIN) 0.5 MG tablet Take 0.5 mg by mouth every 8 (eight) hours as needed for anxiety.      fluticasone (FLONASE) 50 MCG/ACT nasal spray Place 1 spray into both nostrils daily.     Insulin Glargine (LANTUS [place]) Inject 30 Units into the skin daily.     isosorbide-hydrALAZINE (BIDIL) 20-37.5 MG tablet Take 1 tablet by mouth 3 (three) times daily. 200 tablet 3   lansoprazole (PREVACID) 30 MG capsule Take 30 mg by mouth daily.     pravastatin (PRAVACHOL) 40 MG tablet Take 40 mg by mouth at bedtime.      valsartan (DIOVAN) 40 MG tablet One daily 30 tablet 2   No current facility-administered medications for this encounter.   BP (!) 140/70   Pulse 66   Wt 74.9 kg (165 lb 3.2 oz)   SpO2 99%   BMI 26.66 kg/m  General: NAD Neck: No JVD, no thyromegaly or thyroid nodule.  Lungs: Clear to auscultation bilaterally with normal respiratory effort. CV: Nondisplaced PMI.  Heart regular S1/S2, no S3/S4, no murmur.  No peripheral edema.  No carotid bruit.  Normal pedal pulses.  Abdomen: Soft, nontender, no hepatosplenomegaly, no distention.  Skin: Intact without lesions or rashes.  Neurologic: Alert and oriented x 3.  Psych: Normal affect. Extremities: No clubbing or cyanosis.  HEENT: Normal.   Assessment/Plan: 1. 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  Cardiolite in 4/23 with no ischemia or infarction. Most recent echo in 1/23 showed EF 45-50%, mild LV dilation, mild LVH, GLS -12.8% with apical sparing (?amyloidosis), normal RV, trivial pericardial  effusion, moderate MR. PYP scan in 2/23 was equivocal, gene testing for hereditary TTR amyloidosis was negative.  Cardiac MRI in 5/23 showed  mild LV dilation with EF 37%, severe basal septal hypokinesis, no LVH, normal RV with RVEF 57%, diffuse mid wall LGE in the basal anteroseptum and basal inferoseptum, ECV 41% basal septum and 27% lateral wall. This was most suggestive of prior myocarditis or cardiac sarcoidosis, less likely cardiac amyloidosis.  I am concerned for possible sarcoidosis based on CT chest in 6/22 that showed "innumerable" pulmonary nodules.  Repeat HRCT in 7/23 was unchanged.  She saw pulmonary who thought pulmonary sarcoidosis was unlikely.  She does not look volume overloaded on exam. NYHA class II. GDMT limited by CKD stage IV.  - She still needs cardiac PET at [facility] to  assess for signs of cardiac sarcoidosis, will make sure this is scheduled.   - Continue Coreg 25 mg bid.  - Continue valsartan 40 mg daily (would not increase with elevated creatinine and lightheadedness). BMET today.  - She did not tolerate Farxiga due to yeast infections.  - Avoid spironolactone for now with history of hyperkalemia and CKD.  - Continue Bidil 1 tab tid.  - Stop amlodipine due to lightheadedness.  2. CKD stage IV: She had a tendency towards mild hyperkalemia.  BMET today.  3. HTN: BP elevated but has taken no meds.  She gets lightheaded after taking Bidil.  - I will stop amlodipine to see if this helps with the lightheadedness.   4. PVCs: 9% PVCs on 7/23 Zio monitor, probably not enough to explain cardiomyopathy.    Followup in HF pharmacist in 3 wks for med titration, see me in 3 months.   Loralie Champagne 05/13/2022

## 2022-05-28 DIAGNOSIS — N184 Chronic kidney disease, stage 4 (severe): Secondary | ICD-10-CM | POA: Diagnosis not present

## 2022-05-28 DIAGNOSIS — R809 Proteinuria, unspecified: Secondary | ICD-10-CM | POA: Diagnosis not present

## 2022-05-28 DIAGNOSIS — I509 Heart failure, unspecified: Secondary | ICD-10-CM | POA: Diagnosis not present

## 2022-05-28 DIAGNOSIS — E211 Secondary hyperparathyroidism, not elsewhere classified: Secondary | ICD-10-CM | POA: Diagnosis not present

## 2022-05-28 DIAGNOSIS — D631 Anemia in chronic kidney disease: Secondary | ICD-10-CM | POA: Diagnosis not present

## 2022-05-28 DIAGNOSIS — N189 Chronic kidney disease, unspecified: Secondary | ICD-10-CM | POA: Diagnosis not present

## 2022-05-28 DIAGNOSIS — I129 Hypertensive chronic kidney disease with stage 1 through stage 4 chronic kidney disease, or unspecified chronic kidney disease: Secondary | ICD-10-CM | POA: Diagnosis not present

## 2022-06-02 ENCOUNTER — Inpatient Hospital Stay (HOSPITAL_COMMUNITY): Admission: RE | Admit: 2022-06-02 | Payer: Medicare Other | Source: Ambulatory Visit

## 2022-06-16 DIAGNOSIS — R300 Dysuria: Secondary | ICD-10-CM | POA: Diagnosis not present

## 2022-06-17 DIAGNOSIS — R300 Dysuria: Secondary | ICD-10-CM | POA: Diagnosis not present

## 2022-06-23 ENCOUNTER — Inpatient Hospital Stay (HOSPITAL_COMMUNITY): Admission: RE | Admit: 2022-06-23 | Payer: Medicare Other | Source: Ambulatory Visit

## 2022-06-28 DIAGNOSIS — Z8679 Personal history of other diseases of the circulatory system: Secondary | ICD-10-CM | POA: Diagnosis not present

## 2022-06-28 DIAGNOSIS — E211 Secondary hyperparathyroidism, not elsewhere classified: Secondary | ICD-10-CM | POA: Diagnosis not present

## 2022-06-28 DIAGNOSIS — I5042 Chronic combined systolic (congestive) and diastolic (congestive) heart failure: Secondary | ICD-10-CM | POA: Diagnosis not present

## 2022-06-28 DIAGNOSIS — N189 Chronic kidney disease, unspecified: Secondary | ICD-10-CM | POA: Diagnosis not present

## 2022-06-28 DIAGNOSIS — N184 Chronic kidney disease, stage 4 (severe): Secondary | ICD-10-CM | POA: Diagnosis not present

## 2022-06-28 DIAGNOSIS — R809 Proteinuria, unspecified: Secondary | ICD-10-CM | POA: Diagnosis not present

## 2022-06-28 DIAGNOSIS — D631 Anemia in chronic kidney disease: Secondary | ICD-10-CM | POA: Diagnosis not present

## 2022-06-28 DIAGNOSIS — I9589 Other hypotension: Secondary | ICD-10-CM | POA: Diagnosis not present

## 2022-07-06 ENCOUNTER — Other Ambulatory Visit: Payer: Self-pay | Admitting: Internal Medicine

## 2022-07-07 ENCOUNTER — Telehealth: Payer: Self-pay | Admitting: Internal Medicine

## 2022-07-07 NOTE — Telephone Encounter (Signed)
Called and spoke to patient she states that she was supposed to have a PET scan done by Dr. Algernon Huxley and Dr. Melvyn Novas wanted to see pt after PET scan was done. Patient has not had PET scan scheduled. She wants to know if she should still come to her ov on Friday and also wants to know if we can refill her valsartan prescribed at last ov in July.   Please advise

## 2022-07-07 NOTE — Telephone Encounter (Signed)
Called and spoke to Kayla Bell (patients husband- ok per dpr) and notified him of response- notified him Pet scan and bp meds could be discussed at appt Friday. Nothing further needed

## 2022-07-07 NOTE — Telephone Encounter (Signed)
Upon review of the studies we have, I no longer feel PET is indicated and probably would not be paid for by her insurance anyway so keep appt so we can regroup and decide then what's best to do going forward

## 2022-07-09 ENCOUNTER — Encounter: Payer: Self-pay | Admitting: Internal Medicine

## 2022-07-09 ENCOUNTER — Ambulatory Visit: Payer: Medicare Other | Admitting: Internal Medicine

## 2022-07-09 DIAGNOSIS — R918 Other nonspecific abnormal finding of lung field: Secondary | ICD-10-CM

## 2022-07-09 DIAGNOSIS — I10 Essential (primary) hypertension: Secondary | ICD-10-CM | POA: Diagnosis not present

## 2022-07-09 MED ORDER — VALSARTAN 40 MG PO TABS: ORAL_TABLET | ORAL | 2 refills | Status: DC

## 2022-07-09 NOTE — Assessment & Plan Note (Addendum)
D/c ACEi 04/12/2022  Due to cough/ pnds > much better 07/09/2022   Although even in retrospect it may not be clear the ACEi contributed to the pt's symptoms,  Pt improved off them and adding them back at this point or in the future would risk confusion in interpretation of non-specific respiratory symptoms to which this patient is prone  ie  Better not to muddy the waters here.          Each maintenance medication was reviewed in detail including emphasizing most importantly the difference between maintenance and prns and under what circumstances the prns are to be triggered using an action plan format where appropriate.  Total time for H and P, chart review, counseling,  and generating customized AVS unique to this office visit / same day charting = 25 min

## 2022-07-09 NOTE — Assessment & Plan Note (Addendum)
Never smoker - 1st noted 02/25/21 up to 9 mm - repeat ct 03/31/22 up to  8 mm with ACE =10   - 04/12/2022   Walked on RA  x  3  lap(s) =  approx 450  ft  @ fast pace, stopped due to end of study with lowest 02 sats 97% and no sob   Strong evidence these are benign and unlikely  PET would be helpful at this size or paid for by insurance so rec f/u with plain cxr instead at 6 m  Discussed in detail all the  indications, usual  risks and alternatives  relative to the benefits with patient who agrees to proceed with conservative f/u as outlined

## 2022-07-09 NOTE — Addendum Note (Signed)
Addended by: Christinia Gully B on: 07/09/2022 09:18 AM   Modules accepted: Orders

## 2022-07-09 NOTE — Progress Notes (Signed)
Kayla Bell, female    DOB: 1946-06-15    MRN: [id number]   Brief patient profile:  71  yobf  never smoker  referred to pulmonary clinic in [place]  04/12/2022 by Dr Kayla Bell  for MPNs and chronic cough.   History of Present Illness  04/12/2022  Pulmonary/ 1st office eval/ Kayla Bell / [place] [facility]  - on ACEi  Chief Complaint  Patient presents with   Consult    Consult for abnormal CT done at [facility]   Dyspnea:  slowed by R knee ( s/p TKR 3 y  prior to [place] ) more tired than sob with grocery shopping  Cough: x years, dry and with temp changes ( a/c will do it) / assoc nasal congestion  Sleep: bed is flat/ 2 pillows  SABA use: none  Rec Stop lisinopril and start valsartan 40 mg one daily in its place and your upper airway symptoms should resolve within 6 weeks  - we will need to recheck your BMET at end of next week here Call me the day after you have done the PET scan so I can review it> not done as of 07/09/2022       07/09/2022  f/u ov/[name] office/Diar Bell re: chornic cough much better  maint on  ppi only  Chief Complaint  Patient presents with   Follow-up    Discuss pet scan plan  (phone note from this week)   Dyspnea:  no doe but limited by R knee  Cough: improved  Sleeping: flat bed 2 pillows  no resp cc  SABA use: none  02: none  Covid status: all but new vax/ never infeted      No obvious day to day or daytime variability or assoc excess/ purulent sputum or mucus plugs or hemoptysis or cp or chest tightness, subjective wheeze or overt sinus or hb symptoms.   Sleeping  without nocturnal  or early am exacerbation  of respiratory  c/o's or need for noct saba. Also denies any obvious fluctuation of symptoms with weather or environmental changes or other aggravating or alleviating factors except as outlined above   No unusual exposure hx or h/o childhood pna/ asthma or knowledge of premature birth.  Current Allergies, Complete Past Medical History, Past Surgical  History, Family History, and Social History were reviewed in [employer] record.  ROS  The following are not active complaints unless bolded Hoarseness, sore throat, dysphagia, dental problems, itching, sneezing,  nasal congestion or discharge of excess mucus or purulent secretions, ear ache,   fever, chills, sweats, unintended wt loss or wt gain, classically pleuritic or exertional cp,  orthopnea pnd or arm/hand swelling  or leg swelling, presyncope, palpitations, abdominal pain, anorexia, nausea, vomiting, diarrhea  or change in bowel habits or change in bladder habits, change in stools or change in urine, dysuria, hematuria,  rash, arthralgias, visual complaints, headache, numbness, weakness or ataxia or problems with walking or coordination,  change in mood or  memory.        Current Meds  Medication Sig   allopurinol (ZYLOPRIM) 100 MG tablet Take 150 mg by mouth every evening.    carvedilol (COREG) 25 MG tablet Take 25 mg by mouth 2 (two) times daily with a meal.   clonazePAM (KLONOPIN) 0.5 MG tablet Take 0.5 mg by mouth every 8 (eight) hours as needed for anxiety.    fluticasone (FLONASE) 50 MCG/ACT nasal spray Place 1 spray into both nostrils daily.   Insulin Glargine (LANTUS  [place]) Inject 30 Units into the skin daily.   isosorbide-hydrALAZINE (BIDIL) 20-37.5 MG tablet Take 1 tablet by mouth 3 (three) times daily.   lansoprazole (PREVACID) 30 MG capsule Take 30 mg by mouth daily.   pravastatin (PRAVACHOL) 40 MG tablet Take 40 mg by mouth at bedtime.    valsartan (DIOVAN) 40 MG tablet Take 1 tablet by mouth once daily                    Past Medical History:  Diagnosis Date   Anemia    Anxiety    Arthritis    Cardiomyopathy ([place])    Chronic kidney disease, stage 3 (HCC)    Depression    Essential hypertension    GERD (gastroesophageal reflux disease)    Gout    Hyperlipidemia    Type 2 diabetes mellitus (HCC)       Objective:     Wt Readings from  Last 3 Encounters:  07/09/22 166 lb 3.2 oz (75.4 kg)  05/12/22 165 lb 3.2 oz (74.9 kg)  04/12/22 167 lb 6.4 oz (75.9 kg)      Vital signs reviewed  07/09/2022  - Note at rest 02 sats  96% on RA   General appearance:    healthy appearing amb bf nad    HEENT : Oropharynx  clear    Nasal turbinates nl   NECK :  without  apparent JVD/ palpable Nodes/TM    LUNGS: no acc muscle use,  Nl contour chest which is clear to A and P bilaterally without cough on insp or exp maneuvers   CV:  RRR  no s3 or murmur or increase in P2, and no edema   ABD:  soft and nontender with nl inspiratory excursion in the supine position. No bruits or organomegaly appreciated   MS:  Nl gait/ ext warm without deformities Or obvious joint restrictions  calf tenderness, cyanosis or clubbing    SKIN: warm and dry without lesions    NEURO:  alert, approp, nl sensorium with  no motor or cerebellar deficits apparent.                      Assessment

## 2022-07-09 NOTE — Patient Instructions (Signed)
No change in recommendations   Please schedule a follow up visit in 6  months but call sooner if needed    

## 2022-07-12 ENCOUNTER — Inpatient Hospital Stay (HOSPITAL_COMMUNITY): Admission: RE | Admit: 2022-07-12 | Payer: Medicare Other | Source: Ambulatory Visit

## 2022-07-26 ENCOUNTER — Ambulatory Visit: Payer: Medicare Other | Admitting: Cardiology

## 2022-08-02 NOTE — Progress Notes (Incomplete)
***In Progress***    Advanced Heart Failure Clinic Note   HPI:  76 y.o. with history of CKD stage 4, chronic HF with mid range EF, type 2 diabetes, and HTN was referred by Dr. Domenic Polite for evaluation of possible cardiac amyloidosis.  Patient has had a long-standing mild cardiomyopathy, with most recent echo in 1/23 showing EF 45-50%, mild LV dilation, mild LVH, GLS -12.8% with apical sparing (?amyloidosis), normal RV, trivial pericardial effusion, moderate MR. She had a PYP scan in 2/23 that was equivocal with Grade 2, H/CL 1-1.5.  Remote ischemic workup was negative (Cardiolite in 2002).  Baseline creatinine is around 2.2. She is off Entresto and spironolactone with elevated creatinine and hyperkalemia.    Cardiolite in 4/23 showed no ischemia/infarction, PVCs noted.  Inv[employer]ae gene testing for hereditary TTR cardiac amyloidosis was negative.  Cardiac MRI in 5/23 showed mild LV dilation with EF 37%, severe basal septal hypokinesis, no LVH, normal RV with RVEF 57%, diffuse mid wall LGE in the basal anteroseptum and basal inferoseptum, ECV 41% basal septum and 27% lateral wall.    She stopped [place] due to recurrent yeast infections.    She had repeat HRCT in 7/23 again showing innumerable small nodules.  They were stable and likely benign.  She saw Dr. Melvyn Novas who did not see definite signs of pulmonary sarcoidosis.  ACEI was stopped and ARB begun.    Patient returns for followup of CHF.  Weight stable.  She has not taken any of her meds yet today.   She gets lightheaded when she takes Bidil.  No falls.  No dyspnea walking on flat ground.  She does get short of breath walking up a hill or stairs.  No orthopnea/PND.  No chest pain.    Today she returns to HF clinic for pharmacist medication titration. At last visit with Dr. Aundra Dubin, amlodipine was discontinued due to lightheadedness.   Shortness of breath/dyspnea on exertion? {YES NO:22349}  Orthopnea/PND? {YES NO:22349} Edema? {YES  NO:22349} Lightheadedness/dizziness? {YES NO:22349} Daily weights at home? {YES NO:22349} Blood pressure/heart rate monitoring at home? {YES [id number] Following low-sodium/fluid-restricted diet? {YES NO:22349}  HF Medications: Carvedilol 25 mg BID Valsartan 40 mg daily  BiDil 20/37.5 mg TID  Has the patient been experiencing any side effects to the medications prescribed?  {YES NO:22349}  Does the patient have any problems obtaining medications due to transportation or finances?   {YES NO:22349}  Understanding of regimen: {excellent/good/fair/poor:19665} Understanding of indications: {excellent/good/fair/poor:19665} Potential of compliance: {excellent/good/fair/poor:19665} Patient understands to avoid NSAIDs. Patient understands to avoid decongestants.    Pertinent Lab Values (from 05/12/2022): Serum creatinine 1.91, BUN 45, Potassium 4.5, Sodium 141, BNP 188.3  Vital Signs: Weight: *** (last clinic weight: ***) Blood pressure: ***  Heart rate: ***   Assessment/Plan: 1. 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  Cardiolite in 4/23 with no ischemia or infarction. Most recent echo in 1/23 showed EF 45-50%, mild LV dilation, mild LVH, GLS -12.8% with apical sparing (?amyloidosis), normal RV, trivial pericardial effusion, moderate MR. PYP scan in 2/23 was equivocal, gene testing for hereditary TTR amyloidosis was negative.  Cardiac MRI in 5/23 showed  mild LV dilation with EF 37%, severe basal septal hypokinesis, no LVH, normal RV with RVEF 57%, diffuse mid wall LGE in the basal anteroseptum and basal inferoseptum, ECV 41% basal septum and 27% lateral wall. This was most suggestive of prior myocarditis or cardiac sarcoidosis, less likely cardiac amyloidosis.  I am concerned for possible sarcoidosis based on CT  chest in 6/22 that showed "innumerable" pulmonary nodules.  Repeat HRCT in 7/23 was unchanged.  She saw pulmonary who thought pulmonary  sarcoidosis was unlikely.  She does not look volume overloaded on exam. NYHA class II. GDMT limited by CKD stage IV.  - She still needs cardiac PET at [facility] to assess for signs of cardiac sarcoidosis, will make sure this is scheduled.   - Continue Coreg 25 mg bid.  - Continue valsartan 40 mg daily (would not increase with elevated creatinine and lightheadedness). BMET today.  - She did not tolerate Farxiga due to yeast infections.  - Avoid spironolactone for now with history of hyperkalemia and CKD.  - Continue Bidil 1 tab tid.  - Stop amlodipine due to lightheadedness.  2. CKD stage IV: She had a tendency towards mild hyperkalemia.  BMET today.  3. HTN: BP elevated but has taken no meds.  She gets lightheaded after taking Bidil.  - I will stop amlodipine to see if this helps with the lightheadedness.   4. PVCs: 9% PVCs on 7/23 Zio monitor, probably not enough to explain cardiomyopathy.    Follow up ***  Kayla Bell, PharmD, BCPS, BCCP, CPP Heart Failure Clinic Pharmacist [phone]

## 2022-08-03 ENCOUNTER — Encounter (HOSPITAL_COMMUNITY): Payer: Self-pay

## 2022-08-03 ENCOUNTER — Ambulatory Visit (HOSPITAL_COMMUNITY)
Admission: RE | Admit: 2022-08-03 | Discharge: 2022-08-03 | Disposition: A | Discharge status: Home / Self Care | Payer: Medicare Other | Source: Ambulatory Visit | Attending: Cardiology | Admitting: Cardiology

## 2022-08-03 VITALS — BP 138/76 | HR 71 | Wt 168.0 lb

## 2022-08-03 DIAGNOSIS — I5022 Chronic systolic (congestive) heart failure: Secondary | ICD-10-CM | POA: Diagnosis not present

## 2022-08-03 DIAGNOSIS — Z79899 Other long term (current) drug therapy: Secondary | ICD-10-CM | POA: Diagnosis not present

## 2022-08-03 DIAGNOSIS — I429 Cardiomyopathy, unspecified: Secondary | ICD-10-CM | POA: Insufficient documentation

## 2022-08-03 DIAGNOSIS — I5020 Unspecified systolic (congestive) heart failure: Secondary | ICD-10-CM | POA: Diagnosis not present

## 2022-08-03 DIAGNOSIS — E1122 Type 2 diabetes mellitus with diabetic chronic kidney disease: Secondary | ICD-10-CM | POA: Diagnosis not present

## 2022-08-03 DIAGNOSIS — N184 Chronic kidney disease, stage 4 (severe): Secondary | ICD-10-CM | POA: Diagnosis not present

## 2022-08-03 DIAGNOSIS — Z794 Long term (current) use of insulin: Secondary | ICD-10-CM | POA: Insufficient documentation

## 2022-08-03 DIAGNOSIS — I130 Hypertensive heart and chronic kidney disease with heart failure and stage 1 through stage 4 chronic kidney disease, or unspecified chronic kidney disease: Secondary | ICD-10-CM | POA: Diagnosis not present

## 2022-08-03 DIAGNOSIS — M25561 Pain in right knee: Secondary | ICD-10-CM | POA: Diagnosis not present

## 2022-08-03 MED ORDER — VALSARTAN 40 MG PO TABS: 40.0000 mg | ORAL_TABLET | Freq: Every day | ORAL | 3 refills | Status: DC
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
## 2022-08-03 NOTE — Patient Instructions (Signed)
It was a pleasure seeing you today!  MEDICATIONS: -No medication changes today -Please start taking your insulin in the morning. Please reach out to your primary care provider for further instructions on reducing your insulin dose.  -Call if you have questions about your medications.  NEXT APPOINTMENT: Return to clinic on 08/13/2022 with Dr. Aundra Dubin.  In general, to take care of your heart failure: -Limit your fluid intake to 2 Liters (half-gallon) per day.   -Limit your salt intake to ideally 2-3 grams (2000-3000 mg) per day. -Weigh yourself daily and record, and bring that "weight diary" to your next appointment.  (Weight gain of 2-3 pounds in 1 day typically means fluid weight.) -The medications for your heart are to help your heart and help you live longer.   -Please contact us before stopping any of your heart medications.  Call the clinic at [phone] with questions or to reschedule future appointments.

## 2022-08-09 DIAGNOSIS — N183 Chronic kidney disease, stage 3 unspecified: Secondary | ICD-10-CM | POA: Diagnosis not present

## 2022-08-09 DIAGNOSIS — E781 Pure hyperglyceridemia: Secondary | ICD-10-CM | POA: Diagnosis not present

## 2022-08-09 DIAGNOSIS — I129 Hypertensive chronic kidney disease with stage 1 through stage 4 chronic kidney disease, or unspecified chronic kidney disease: Secondary | ICD-10-CM | POA: Diagnosis not present

## 2022-08-09 DIAGNOSIS — I10 Essential (primary) hypertension: Secondary | ICD-10-CM | POA: Diagnosis not present

## 2022-08-09 DIAGNOSIS — E1165 Type 2 diabetes mellitus with hyperglycemia: Secondary | ICD-10-CM | POA: Diagnosis not present

## 2022-08-09 DIAGNOSIS — E7849 Other hyperlipidemia: Secondary | ICD-10-CM | POA: Diagnosis not present

## 2022-08-09 DIAGNOSIS — E1122 Type 2 diabetes mellitus with diabetic chronic kidney disease: Secondary | ICD-10-CM | POA: Diagnosis not present

## 2022-08-12 DIAGNOSIS — I43 Cardiomyopathy in diseases classified elsewhere: Secondary | ICD-10-CM | POA: Diagnosis not present

## 2022-08-12 DIAGNOSIS — I119 Hypertensive heart disease without heart failure: Secondary | ICD-10-CM | POA: Diagnosis not present

## 2022-08-12 DIAGNOSIS — E1122 Type 2 diabetes mellitus with diabetic chronic kidney disease: Secondary | ICD-10-CM | POA: Diagnosis not present

## 2022-08-12 DIAGNOSIS — D509 Iron deficiency anemia, unspecified: Secondary | ICD-10-CM | POA: Diagnosis not present

## 2022-08-12 DIAGNOSIS — E7849 Other hyperlipidemia: Secondary | ICD-10-CM | POA: Diagnosis not present

## 2022-08-12 DIAGNOSIS — I10 Essential (primary) hypertension: Secondary | ICD-10-CM | POA: Diagnosis not present

## 2022-08-12 DIAGNOSIS — Z23 Encounter for immunization: Secondary | ICD-10-CM | POA: Diagnosis not present

## 2022-08-12 DIAGNOSIS — I130 Hypertensive heart and chronic kidney disease with heart failure and stage 1 through stage 4 chronic kidney disease, or unspecified chronic kidney disease: Secondary | ICD-10-CM | POA: Diagnosis not present

## 2022-08-12 DIAGNOSIS — E11649 Type 2 diabetes mellitus with hypoglycemia without coma: Secondary | ICD-10-CM | POA: Diagnosis not present

## 2022-08-12 DIAGNOSIS — I5022 Chronic systolic (congestive) heart failure: Secondary | ICD-10-CM | POA: Diagnosis not present

## 2022-08-12 DIAGNOSIS — E1165 Type 2 diabetes mellitus with hyperglycemia: Secondary | ICD-10-CM | POA: Diagnosis not present

## 2022-08-13 ENCOUNTER — Ambulatory Visit (HOSPITAL_COMMUNITY)
Admission: RE | Admit: 2022-08-13 | Discharge: 2022-08-13 | Disposition: A | Discharge status: Home / Self Care | Payer: Medicare Other | Source: Ambulatory Visit | Attending: Cardiology | Admitting: Cardiology

## 2022-08-13 VITALS — BP 160/80 | HR 75 | Wt 165.0 lb

## 2022-08-13 DIAGNOSIS — N184 Chronic kidney disease, stage 4 (severe): Secondary | ICD-10-CM | POA: Insufficient documentation

## 2022-08-13 DIAGNOSIS — E1122 Type 2 diabetes mellitus with diabetic chronic kidney disease: Secondary | ICD-10-CM | POA: Insufficient documentation

## 2022-08-13 DIAGNOSIS — E875 Hyperkalemia: Secondary | ICD-10-CM | POA: Insufficient documentation

## 2022-08-13 DIAGNOSIS — I5032 Chronic diastolic (congestive) heart failure: Secondary | ICD-10-CM

## 2022-08-13 DIAGNOSIS — I493 Ventricular premature depolarization: Secondary | ICD-10-CM | POA: Insufficient documentation

## 2022-08-13 DIAGNOSIS — I5022 Chronic systolic (congestive) heart failure: Secondary | ICD-10-CM | POA: Insufficient documentation

## 2022-08-13 DIAGNOSIS — I130 Hypertensive heart and chronic kidney disease with heart failure and stage 1 through stage 4 chronic kidney disease, or unspecified chronic kidney disease: Secondary | ICD-10-CM | POA: Insufficient documentation

## 2022-08-13 DIAGNOSIS — Z79899 Other long term (current) drug therapy: Secondary | ICD-10-CM | POA: Insufficient documentation

## 2022-08-13 MED ORDER — ISOSORB DINITRATE-HYDRALAZINE 20-37.5 MG PO TABS: 1.5000 | ORAL_TABLET | Freq: Three times a day (TID) | ORAL | 3 refills | Status: DC

## 2022-08-13 NOTE — Patient Instructions (Addendum)
INCREASE Bidil to 1 1/2 Tabs Daily.  Your provider has ordered a PET SCAN at [place] recommends that you schedule a follow-up appointment in: 3 months  If you have any questions or concerns before your next appointment please send [place] a message through [place] or call our office at [phone].    TO LEAVE A MESSAGE FOR THE NURSE SELECT OPTION 2, PLEASE LEAVE A MESSAGE INCLUDING: YOUR NAME DATE OF BIRTH CALL BACK NUMBER REASON FOR CALL**this is important as we prioritize the call backs  YOU WILL RECEIVE A CALL BACK THE SAME DAY AS LONG AS YOU CALL BEFORE 4:00 PM  At the [facility], you and your health needs are our priority. As part of our continuing mission to provide you with exceptional heart care, we have created designated Provider Care Teams. These Care Teams include your primary Cardiologist (physician) and Advanced Practice Providers (APPs- Physician Assistants and Nurse Practitioners) who all work together to provide you with the care you need, when you need it.   You may see any of the following providers on your designated Care Team at your next follow up: Dr Glori Bickers Dr Loralie Champagne Dr. Roxana Hires, NP Lyda Jester, [place] [facility], [place] Forestine Na, NP Audry Riles, PharmD   Please be sure to bring in all your medications bottles to every appointment.

## 2022-08-14 NOTE — Progress Notes (Signed)
PCP: Manon Hilding, MD Cardiology: Dr. Domenic Polite HF Cardiology: Dr. Aundra Dubin  76 y.o. with history of CKD stage 4, chronic HF with mid range EF, type 2 diabetes, and HTN was referred by Dr. Domenic Polite for evaluation of possible cardiac amyloidosis.  Patient has had a long-standing mild cardiomyopathy, with most recent echo in 1/23 showing EF 45-50%, mild LV dilation, mild LVH, GLS -12.8% with apical sparing (?amyloidosis), normal RV, trivial pericardial effusion, moderate MR. She had a PYP scan in 2/23 that was equivocal with Grade 2, H/CL 1-1.5.  Remote ischemic workup was negative (Cardiolite in 2002).  Baseline creatinine is around 2.2. She is off Entresto and spironolactone with elevated creatinine and hyperkalemia.   Cardiolite in 4/23 showed no ischemia/infarction, PVCs noted.  Inv[employer]ae gene testing for hereditary TTR cardiac amyloidosis was negative.  Cardiac MRI in 5/23 showed mild LV dilation with EF 37%, severe basal septal hypokinesis, no LVH, normal RV with RVEF 57%, diffuse mid wall LGE in the basal anteroseptum and basal inferoseptum, ECV 41% basal septum and 27% lateral wall.   She stopped [place] due to recurrent yeast infections.   She had repeat HRCT in 7/23 again showing innumerable small nodules.  They were stable and likely benign.  She saw Dr. Melvyn Novas who did not see definite signs of pulmonary sarcoidosis.  ACEI was stopped and ARB begun.   Patient returns for followup of CHF.  Weight is stable.  She is able to do chores around the house and her ADLs without dyspnea.  No dyspnea walking on flat ground.  No chest pain.  She chronically sleeps on 2 pillows.  No ligthheadedness.  BP is elevated today.   ECG (personally reviewed): NSR, normal  Labs (2/23): K 5.7, creatinine 2.2, urine immunofixation negative, SPEP negative.  Labs (3/23): K 4.6 Labs (4/23): K 5.3, creatinine 2.2 Labs (6/23): ACE level normal, BNP 124 Labs (8/23): K 4.8, creatinine 1.86 => 1.91, BNP 188  PMH: 1. CKD  stage 4 2. HTN 3. Depression 4. Gout  5. Type 2 diabetes 6. Chronic HF with mid range EF: Uncertain etiology. - Negative stress test around 2002 at [facility].  - Echo (9/14): EF 35-40% - Echo (1/22): EF 40-45% - Echo (1/23): EF 45-50%, mild LV dilation, mild LVH, GLS -12.8% with apical sparing (?amyloidosis), normal RV, trivial pericardial effusion, moderate MR.  - PYP scan (2/23): Grade 2, H/CL 1-1.5 (equivocal). [employer]ae gene testing for hereditary TTR cardiac amyloidosis was negative.  - Cardiolite (4/23): no ischemia/infarction, PVCs noted. - Cardiac MRI (5/23): mild LV dilation with EF 37%, severe basal septal hypokinesis, no LVH, normal RV with RVEF 57%, diffuse mid wall LGE in the basal anteroseptum and basal inferoseptum, ECV 41% basal septum and 27% lateral wall.  7. Pulmonary nodules: CT chest at [facility] in 6/22 showed "innumerable" pulmonary nodules.  - HRCT (7/23): Innumerable small nodules, stable and likely benign.  8. PVCs: Zio monitor (7/23) with 9% PVCs  Social History   Socioeconomic History   Marital status: Married    Spouse name: Not on file   Number of children: Not on file   Years of education: Not on file   Highest education level: Not on file  Occupational History   Not on file  Tobacco Use   Smoking status: Never   Smokeless tobacco: Never  Vaping Use   Vaping Use: Never used  Substance and Sexual Activity   Alcohol use: Never    Alcohol/week: 0.0 standard drinks of alcohol  Drug use: Never   Sexual activity: Not on file  Other Topics Concern   Not on file  Social History Narrative   Not on file   Social Determinants of Health   Financial Resource Strain: Not on file  Food Insecurity: Not on file  Transportation Needs: Not on file  Physical Activity: Not on file  Stress: Not on file  Social Connections: Not on file  Intimate Partner Violence: Not on file   Family History  Problem Relation Age of Onset   Kidney disease  Sister    Heart disease Sister    Heart disease Brother    Heart failure Son    Heart disease Sister    ROS: All systems reviewed and negative except as per HPI.   Current Outpatient Medications  Medication Sig Dispense Refill   allopurinol (ZYLOPRIM) 100 MG tablet Take 150 mg by mouth every evening.      aspirin EC 81 MG tablet Take 81 mg by mouth daily. Swallow whole.     calcitRIOL (ROCALTROL) 0.25 MCG capsule Take 0.25 mcg by mouth every Monday, Wednesday, and Friday.     carvedilol (COREG) 25 MG tablet Take 25 mg by mouth 2 (two) times daily with a meal.     chlorthalidone (HYGROTON) 25 MG tablet Take 12.5 mg by mouth every other day.     clonazePAM (KLONOPIN) 0.5 MG tablet Take 0.5 mg by mouth every 8 (eight) hours as needed for anxiety.      fluticasone (FLONASE) 50 MCG/ACT nasal spray Place 1 spray into both nostrils daily.     furosemide (LASIX) 20 MG tablet Take 20 mg by mouth daily as needed for fluid.     Insulin Glargine (LANTUS [place]) Inject 30 Units into the skin daily.     lansoprazole (PREVACID) 30 MG capsule Take 30 mg by mouth daily.     pravastatin (PRAVACHOL) 40 MG tablet Take 40 mg by mouth at bedtime.      valsartan (DIOVAN) 40 MG tablet Take 1 tablet (40 mg total) by mouth daily. Take 1 tablet by mouth once daily 90 tablet 3   isosorbide-hydrALAZINE (BIDIL) 20-37.5 MG tablet Take 1.5 tablets by mouth 3 (three) times daily. 200 tablet 3   No current facility-administered medications for this encounter.   BP (!) 160/80   Pulse 75   Wt 74.8 kg (165 lb)   SpO2 98%   BMI 26.23 kg/m  General: NAD Neck: No JVD, no thyromegaly or thyroid nodule.  Lungs: Clear to auscultation bilaterally with normal respiratory effort. CV: Nondisplaced PMI.  Heart regular S1/S2, no S3/S4, no murmur.  No peripheral edema.  No carotid bruit.  Normal pedal pulses.  Abdomen: Soft, nontender, no hepatosplenomegaly, no distention.  Skin: Intact without lesions or rashes.  Neurologic: Alert  and oriented x 3.  Psych: Normal affect. Extremities: No clubbing or cyanosis.  HEENT: Normal.   Assessment/Plan: 1. 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  Cardiolite in 4/23 with no ischemia or infarction. Most recent echo in 1/23 showed EF 45-50%, mild LV dilation, mild LVH, GLS -12.8% with apical sparing (?amyloidosis), normal RV, trivial pericardial effusion, moderate MR. PYP scan in 2/23 was equivocal, gene testing for hereditary TTR amyloidosis was negative.  Cardiac MRI in 5/23 showed  mild LV dilation with EF 37%, severe basal septal hypokinesis, no LVH, normal RV with RVEF 57%, diffuse mid wall LGE in the basal anteroseptum and basal inferoseptum, ECV 41% basal septum and 27% lateral  wall. This was most suggestive of prior myocarditis or cardiac sarcoidosis, less likely cardiac amyloidosis.  I am concerned for possible sarcoidosis based on CT chest in 6/22 that showed "innumerable" pulmonary nodules.  Repeat HRCT in 7/23 was unchanged.  She saw pulmonary who thought pulmonary sarcoidosis was unlikely.  GDMT limited by CKD stage IV. She does not look volume overloaded on exam, weight is stable, NYHA class II.  - I think she still needs workup for cardiac sarcoidosis, will arrange for cardiac PET to look for this at [facility].  If this is negative, would plan PYP scan to look for TTR cardiac amyloidosis (think less likely based on MRI pattern).  - Continue Coreg 25 mg bid.  - Continue valsartan 40 mg daily (would not increase with elevated creatinine). BMET today.  - She did not tolerate Farxiga due to yeast infections.  - Avoid spironolactone for now with history of hyperkalemia and CKD.  - Increase Bidil to 1.5 tab tid.   2. CKD stage IV: She had a tendency towards mild hyperkalemia.  BMET today.  3. HTN: Increase Bidil as above.  4. PVCs: 9% PVCs on 7/23 Zio monitor, probably not enough to explain cardiomyopathy.    Followup with APP in 3 months.    Kayla Bell 08/14/2022

## 2022-08-24 ENCOUNTER — Telehealth (HOSPITAL_COMMUNITY): Payer: Self-pay | Admitting: *Deleted

## 2022-10-01 DIAGNOSIS — N184 Chronic kidney disease, stage 4 (severe): Secondary | ICD-10-CM | POA: Diagnosis not present

## 2022-10-01 DIAGNOSIS — D631 Anemia in chronic kidney disease: Secondary | ICD-10-CM | POA: Diagnosis not present

## 2022-10-01 DIAGNOSIS — R809 Proteinuria, unspecified: Secondary | ICD-10-CM | POA: Diagnosis not present

## 2022-10-01 DIAGNOSIS — N189 Chronic kidney disease, unspecified: Secondary | ICD-10-CM | POA: Diagnosis not present

## 2022-10-01 DIAGNOSIS — I509 Heart failure, unspecified: Secondary | ICD-10-CM | POA: Diagnosis not present

## 2022-10-06 ENCOUNTER — Other Ambulatory Visit (HOSPITAL_COMMUNITY): Payer: Self-pay

## 2022-10-06 MED ORDER — ISOSORB DINITRATE-HYDRALAZINE 20-37.5 MG PO TABS: 1.5000 | ORAL_TABLET | Freq: Three times a day (TID) | ORAL | 3 refills | Status: DC

## 2022-10-07 DIAGNOSIS — E211 Secondary hyperparathyroidism, not elsewhere classified: Secondary | ICD-10-CM | POA: Diagnosis not present

## 2022-10-07 DIAGNOSIS — N189 Chronic kidney disease, unspecified: Secondary | ICD-10-CM | POA: Diagnosis not present

## 2022-10-07 DIAGNOSIS — N184 Chronic kidney disease, stage 4 (severe): Secondary | ICD-10-CM | POA: Diagnosis not present

## 2022-10-07 DIAGNOSIS — I5042 Chronic combined systolic (congestive) and diastolic (congestive) heart failure: Secondary | ICD-10-CM | POA: Diagnosis not present

## 2022-10-07 DIAGNOSIS — D631 Anemia in chronic kidney disease: Secondary | ICD-10-CM | POA: Diagnosis not present

## 2022-10-07 DIAGNOSIS — R809 Proteinuria, unspecified: Secondary | ICD-10-CM | POA: Diagnosis not present

## 2022-10-13 DIAGNOSIS — R300 Dysuria: Secondary | ICD-10-CM | POA: Diagnosis not present

## 2022-11-16 ENCOUNTER — Encounter (HOSPITAL_COMMUNITY): Payer: Medicare Other

## 2022-11-29 DIAGNOSIS — E1165 Type 2 diabetes mellitus with hyperglycemia: Secondary | ICD-10-CM | POA: Diagnosis not present

## 2022-11-29 DIAGNOSIS — E781 Pure hyperglyceridemia: Secondary | ICD-10-CM | POA: Diagnosis not present

## 2022-11-29 DIAGNOSIS — I129 Hypertensive chronic kidney disease with stage 1 through stage 4 chronic kidney disease, or unspecified chronic kidney disease: Secondary | ICD-10-CM | POA: Diagnosis not present

## 2022-11-29 DIAGNOSIS — E1122 Type 2 diabetes mellitus with diabetic chronic kidney disease: Secondary | ICD-10-CM | POA: Diagnosis not present

## 2022-11-29 DIAGNOSIS — N189 Chronic kidney disease, unspecified: Secondary | ICD-10-CM | POA: Diagnosis not present

## 2022-11-29 DIAGNOSIS — E782 Mixed hyperlipidemia: Secondary | ICD-10-CM | POA: Diagnosis not present

## 2022-12-01 ENCOUNTER — Encounter (HOSPITAL_COMMUNITY): Payer: Medicare Other

## 2022-12-08 DIAGNOSIS — E1165 Type 2 diabetes mellitus with hyperglycemia: Secondary | ICD-10-CM | POA: Diagnosis not present

## 2022-12-08 DIAGNOSIS — I10 Essential (primary) hypertension: Secondary | ICD-10-CM | POA: Diagnosis not present

## 2022-12-08 DIAGNOSIS — I119 Hypertensive heart disease without heart failure: Secondary | ICD-10-CM | POA: Diagnosis not present

## 2022-12-08 DIAGNOSIS — I5022 Chronic systolic (congestive) heart failure: Secondary | ICD-10-CM | POA: Diagnosis not present

## 2022-12-08 DIAGNOSIS — E11649 Type 2 diabetes mellitus with hypoglycemia without coma: Secondary | ICD-10-CM | POA: Diagnosis not present

## 2022-12-08 DIAGNOSIS — E7849 Other hyperlipidemia: Secondary | ICD-10-CM | POA: Diagnosis not present

## 2022-12-08 DIAGNOSIS — M109 Gout, unspecified: Secondary | ICD-10-CM | POA: Diagnosis not present

## 2022-12-08 DIAGNOSIS — I43 Cardiomyopathy in diseases classified elsewhere: Secondary | ICD-10-CM | POA: Diagnosis not present

## 2022-12-08 DIAGNOSIS — I130 Hypertensive heart and chronic kidney disease with heart failure and stage 1 through stage 4 chronic kidney disease, or unspecified chronic kidney disease: Secondary | ICD-10-CM | POA: Diagnosis not present

## 2022-12-08 DIAGNOSIS — E1122 Type 2 diabetes mellitus with diabetic chronic kidney disease: Secondary | ICD-10-CM | POA: Diagnosis not present

## 2022-12-08 DIAGNOSIS — D509 Iron deficiency anemia, unspecified: Secondary | ICD-10-CM | POA: Diagnosis not present

## 2022-12-09 DIAGNOSIS — I700 Atherosclerosis of aorta: Secondary | ICD-10-CM | POA: Diagnosis not present

## 2022-12-09 DIAGNOSIS — I10 Essential (primary) hypertension: Secondary | ICD-10-CM | POA: Diagnosis not present

## 2022-12-09 DIAGNOSIS — Z0001 Encounter for general adult medical examination with abnormal findings: Secondary | ICD-10-CM | POA: Diagnosis not present

## 2022-12-09 DIAGNOSIS — I5022 Chronic systolic (congestive) heart failure: Secondary | ICD-10-CM | POA: Diagnosis not present

## 2022-12-09 DIAGNOSIS — N183 Chronic kidney disease, stage 3 unspecified: Secondary | ICD-10-CM | POA: Diagnosis not present

## 2022-12-09 DIAGNOSIS — D649 Anemia, unspecified: Secondary | ICD-10-CM | POA: Diagnosis not present

## 2022-12-09 DIAGNOSIS — E1122 Type 2 diabetes mellitus with diabetic chronic kidney disease: Secondary | ICD-10-CM | POA: Diagnosis not present

## 2022-12-09 DIAGNOSIS — E7849 Other hyperlipidemia: Secondary | ICD-10-CM | POA: Diagnosis not present

## 2022-12-09 DIAGNOSIS — F17210 Nicotine dependence, cigarettes, uncomplicated: Secondary | ICD-10-CM | POA: Diagnosis not present

## 2022-12-10 DIAGNOSIS — D631 Anemia in chronic kidney disease: Secondary | ICD-10-CM | POA: Diagnosis not present

## 2022-12-10 DIAGNOSIS — N184 Chronic kidney disease, stage 4 (severe): Secondary | ICD-10-CM | POA: Diagnosis not present

## 2022-12-10 DIAGNOSIS — N189 Chronic kidney disease, unspecified: Secondary | ICD-10-CM | POA: Diagnosis not present

## 2022-12-10 DIAGNOSIS — I509 Heart failure, unspecified: Secondary | ICD-10-CM | POA: Diagnosis not present

## 2022-12-10 DIAGNOSIS — R808 Other proteinuria: Secondary | ICD-10-CM | POA: Diagnosis not present

## 2022-12-15 DIAGNOSIS — I5042 Chronic combined systolic (congestive) and diastolic (congestive) heart failure: Secondary | ICD-10-CM | POA: Diagnosis not present

## 2022-12-15 DIAGNOSIS — D631 Anemia in chronic kidney disease: Secondary | ICD-10-CM | POA: Diagnosis not present

## 2022-12-15 DIAGNOSIS — R809 Proteinuria, unspecified: Secondary | ICD-10-CM | POA: Diagnosis not present

## 2022-12-15 DIAGNOSIS — N184 Chronic kidney disease, stage 4 (severe): Secondary | ICD-10-CM | POA: Diagnosis not present

## 2022-12-15 DIAGNOSIS — E211 Secondary hyperparathyroidism, not elsewhere classified: Secondary | ICD-10-CM | POA: Diagnosis not present

## 2022-12-15 DIAGNOSIS — N189 Chronic kidney disease, unspecified: Secondary | ICD-10-CM | POA: Diagnosis not present

## 2022-12-22 ENCOUNTER — Encounter (HOSPITAL_COMMUNITY): Payer: Self-pay

## 2022-12-22 ENCOUNTER — Ambulatory Visit (HOSPITAL_COMMUNITY)
Admission: RE | Admit: 2022-12-22 | Discharge: 2022-12-22 | Disposition: A | Discharge status: Home / Self Care | Payer: Medicare Other | Source: Ambulatory Visit | Attending: Family Medicine | Admitting: Family Medicine

## 2022-12-22 VITALS — BP 178/80 | HR 74 | Wt 169.6 lb

## 2022-12-22 DIAGNOSIS — R42 Dizziness and giddiness: Secondary | ICD-10-CM | POA: Diagnosis not present

## 2022-12-22 DIAGNOSIS — E1122 Type 2 diabetes mellitus with diabetic chronic kidney disease: Secondary | ICD-10-CM | POA: Diagnosis not present

## 2022-12-22 DIAGNOSIS — I493 Ventricular premature depolarization: Secondary | ICD-10-CM | POA: Insufficient documentation

## 2022-12-22 DIAGNOSIS — I5022 Chronic systolic (congestive) heart failure: Secondary | ICD-10-CM | POA: Diagnosis not present

## 2022-12-22 DIAGNOSIS — I429 Cardiomyopathy, unspecified: Secondary | ICD-10-CM | POA: Diagnosis not present

## 2022-12-22 DIAGNOSIS — I5020 Unspecified systolic (congestive) heart failure: Secondary | ICD-10-CM

## 2022-12-22 DIAGNOSIS — I130 Hypertensive heart and chronic kidney disease with heart failure and stage 1 through stage 4 chronic kidney disease, or unspecified chronic kidney disease: Secondary | ICD-10-CM | POA: Diagnosis not present

## 2022-12-22 DIAGNOSIS — I10 Essential (primary) hypertension: Secondary | ICD-10-CM

## 2022-12-22 DIAGNOSIS — N184 Chronic kidney disease, stage 4 (severe): Secondary | ICD-10-CM | POA: Insufficient documentation

## 2022-12-22 DIAGNOSIS — Z79899 Other long term (current) drug therapy: Secondary | ICD-10-CM | POA: Diagnosis not present

## 2022-12-22 DIAGNOSIS — E875 Hyperkalemia: Secondary | ICD-10-CM | POA: Insufficient documentation

## 2022-12-22 MED ORDER — VALSARTAN 40 MG PO TABS: 40.0000 mg | ORAL_TABLET | Freq: Every day | ORAL | 3 refills | Status: DC

## 2022-12-22 NOTE — Progress Notes (Signed)
PCP: Estanislado PandySasser, Paul W, MD Cardiology: Dr. Diona BrownerMcDowell Nephrology: Dr. Wolfgang PhoenixBhutani HF Cardiology: Dr. Shirlee LatchMcLean  77 y.o. with history of CKD stage 4, chronic HF with mid range EF, type 2 diabetes, and HTN was referred by Dr. Diona BrownerMcDowell for evaluation of possible cardiac amyloidosis.  Patient has had a long-standing mild cardiomyopathy, with most recent echo in 1/23 showing EF 45-50%, mild LV dilation, mild LVH, GLS -12.8% with apical sparing (?amyloidosis), normal RV, trivial pericardial effusion, moderate MR. She had a PYP scan in 2/23 that was equivocal with Grade 2, H/CL 1-1.5.  Remote ischemic workup was negative (Cardiolite in 2002).  Baseline creatinine is around 2.2. She is off Entresto and spironolactone with elevated creatinine and hyperkalemia.   Cardiolite in 4/23 showed no ischemia/infarction, PVCs noted.  Inv[employer]ae gene testing for hereditary TTR cardiac amyloidosis was negative.  Cardiac MRI in 5/23 showed mild LV dilation with EF 37%, severe basal septal hypokinesis, no LVH, normal RV with RVEF 57%, diffuse mid wall LGE in the basal anteroseptum and basal inferoseptum, ECV 41% basal septum and 27% lateral wall.   She stopped [place]Farxiga due to recurrent yeast infections.   She had repeat HRCT in 7/23 again showing innumerable small nodules.  They were stable and likely benign.  She saw Dr. Sherene SiresWert who did not see definite signs of pulmonary sarcoidosis.  ACEI was stopped and ARB begun.   Follow up 12/23, NYHA II, volume stable. [facility].   Today she returns for HF follow up. Overall feeling fine. She is dizzy on BiDil, has been spreading dose out from her other BP-active meds and this seems to be helping. She is not SOB walking up steps, but she is fatigued. Denies abnormal bleeding, CP, edema, or PND/Orthopnea. Appetite ok. No fever or chills. Weight at home 165 pounds. Taking all medications. She takes Lasix 1-2x/week for swelling. BP at home 120/60s.  ECG  (personally reviewed): none ordered today  Labs (2/23): K 5.7, creatinine 2.2, urine immunofixation negative, SPEP negative.  Labs (3/23): K 4.6 Labs (4/23): K 5.3, creatinine 2.2 Labs (6/23): ACE level normal, BNP 124 Labs (8/23): K 4.8, creatinine 1.86 => 1.91, BNP 188 Labs (3/24): K 4.4, creatinine 1.96, hgb 11.3,   PMH: 1. CKD stage 4 2. HTN 3. Depression 4. Gout  5. Type 2 diabetes 6. Chronic HF with mid range EF: Uncertain etiology. - Negative stress [facility] around 2002 [facility].  - Echo (9/14): EF 35-40% - Echo (1/22): EF 40-45% - Echo (1/23): EF 45-50%, mild LV dilation, mild LVH, GLS -12.8% with apical sparing (?amyloidosis), normal RV, trivial pericardial effusion, moderate MR.  - PYP scan (2/23): Grade 2, H/CL 1-1.5 (equivocal). Inv[employer]ae gene testing for hereditary TTR cardiac amyloidosis was negative.  - Cardiolite (4/23): no ischemia/infarction, PVCs noted. - Cardiac MRI (5/23): mild LV dilation with EF 37%, severe basal septal hypokinesis, no LVH, normal RV with RVEF 57%, diffuse mid wall LGE in the basal anteroseptum and basal inferoseptum, ECV 41% basal septum and 27% lateral wall.  7. Pulmonary nodules: [facility]22 showed "innumerable" pulmonary nodules.  - HRCT (7/23): Innumerable small nodules, stable and likely benign.  8. PVCs: Zio monitor (7/23) with 9% PVCs  Social History   Socioeconomic History   Marital status: Married    Spouse name: Not on file   Number of children: Not on file   Years of education: Not on file   Highest education level: Not  on file  Occupational History   Not on file  Tobacco Use   Smoking status: Never   Smokeless tobacco: Never  Vaping Use   Vaping Use: Never used  Substance and Sexual Activity   Alcohol use: Never    Alcohol/week: 0.0 standard drinks of alcohol   Drug use: Never   Sexual activity: Not on file  Other Topics Concern   Not on file  Social History Narrative   Not on file    Social Determinants of Health   Financial Resource Strain: Not on file  Food Insecurity: Not on file  Transportation Needs: Not on file  Physical Activity: Not on file  Stress: Not on file  Social Connections: Not on file  Intimate Partner Violence: Not on file   Family History  Problem Relation Age of Onset   Kidney disease Sister    Heart disease Sister    Heart disease Brother    Heart failure Son    Heart disease Sister    ROS: All systems reviewed and negative except as per HPI.   Current Outpatient Medications  Medication Sig Dispense Refill   allopurinol (ZYLOPRIM) 100 MG tablet Take 150 mg by mouth every evening.      aspirin EC 81 MG tablet Take 81 mg by mouth daily. Swallow whole.     calcitRIOL (ROCALTROL) 0.25 MCG capsule Take 0.25 mcg by mouth every Monday, Wednesday, and Friday.     carvedilol (COREG) 25 MG tablet Take 25 mg by mouth 2 (two) times daily with a meal.     clonazePAM (KLONOPIN) 0.5 MG tablet Take 0.5 mg by mouth every 8 (eight) hours as needed for anxiety.      fluticasone (FLONASE) 50 MCG/ACT nasal spray Place 1 spray into both nostrils daily.     furosemide (LASIX) 20 MG tablet Take 20 mg by mouth daily as needed for fluid.     Insulin Glargine (LANTUS [place]) Inject 30 Units into the skin daily.     isosorbide-hydrALAZINE (BIDIL) 20-37.5 MG tablet Take 1.5 tablets by mouth 3 (three) times daily. 200 tablet 3   lansoprazole (PREVACID) 30 MG capsule Take 30 mg by mouth daily.     pravastatin (PRAVACHOL) 40 MG tablet Take 40 mg by mouth at bedtime.      valsartan (DIOVAN) 40 MG tablet Take 1 tablet (40 mg total) by mouth at bedtime. Take 1 tablet by mouth once daily 90 tablet 3   No current facility-administered medications for this encounter.   BP (!) 178/80   Pulse 74   Wt 76.9 kg (169 lb 9.6 oz)   SpO2 98%   BMI 26.96 kg/m  Physical Exam General:  NAD. No resp difficulty, walked into clinic HEENT: Normal Neck: Supple. No JVD. Carotids 2+  bilat; no bruits. No lymphadenopathy or thryomegaly appreciated. Cor: PMI nondisplaced. Regular rate & rhythm. No rubs, gallops or murmurs. Lungs: Clear Abdomen: Soft, nontender, nondistended. No hepatosplenomegaly. No bruits or masses. Good bowel sounds. Extremities: No cyanosis, clubbing, rash, edema Neuro: Alert & oriented x 3, cranial nerves grossly intact. Moves all 4 extremities w/o difficulty. Affect pleasant.  Assessment/Plan: 1. 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  Cardiolite in 4/23 with no ischemia or infarction. Most recent echo in 1/23 showed EF 45-50%, mild LV dilation, mild LVH, GLS -12.8% with apical sparing (?amyloidosis), normal RV, trivial pericardial effusion, moderate MR. PYP scan in 2/23 was equivocal, gene testing for hereditary TTR amyloidosis was negative.  Cardiac MRI  in 5/23 showed  mild LV dilation with EF 37%, severe basal septal hypokinesis, no LVH, normal RV with RVEF 57%, diffuse mid wall LGE in the basal anteroseptum and basal inferoseptum, ECV 41% basal septum and 27% lateral wall. This was most suggestive of prior myocarditis or cardiac sarcoidosis, less likely cardiac amyloidosis.  I am concerned for possible sarcoidosis based on CT chest in 6/22 that showed "innumerable" pulmonary nodules.  Repeat HRCT in 7/23 was unchanged.  She saw pulmonary who thought pulmonary sarcoidosis was unlikely.  GDMT limited by CKD stage IV. She does not look volume overloaded on exam, weight is stable, NYHA class II, limited mostly by fatigue.  - I think she still needs workup for cardiac sarcoidosis, we have been unable to arrange for cardiac PET at [facility]. Will try to arrange at [facility].  If this is negative, would plan PYP scan to look for TTR cardiac amyloidosis (think less likely based on MRI pattern).  - With morning dizziness, change valsartan to 40 mg qhs (will not increase with elevated creatinine) - Continue BiDil 1.5 tab tid. Has  had dizziness, will not increase further. - Continue Coreg 25 mg bid.  - She did not tolerate Farxiga due to yeast infections.  - Avoid spironolactone for now with history of hyperkalemia and CKD.  2. CKD stage IV: She had a tendency towards mild hyperkalemia.  Recent labs reviewed from Nephrology visit, 12/10/22, K 4.4, SCr 1.96.  3. HTN: BP elevated in clinic, systolic 120's on home check. - I asked her to continue to check BP at home, consider addition of amlodipine. 4. PVCs: 9% PVCs on 7/23 Zio monitor, probably not enough to explain cardiomyopathy.    Followup with Dr. Shirlee Latch in 3 months.  [facility] FNP-BC 12/22/2022

## 2022-12-22 NOTE — Patient Instructions (Signed)
CHANGE Valsartan to 40 mg nightly at bedtime  You have been referred to [facility] They will be in contact once approved with insurance they will be in contact  Your physician wants you to follow-up in: 3 months with Dr Shirlee Latch. You will receive a reminder letter in the mail two months in advance. If you don't receive a letter, please call our office to schedule the follow-up appointment.  Do the following things EVERYDAY: Weigh yourself in the morning before breakfast. Write it down and keep it in a log. Take your medicines as prescribed Eat low salt foods--Limit salt (sodium) to 2000 mg per day.  Stay as active as you can everyday Limit all fluids for the day to less than 2 liters  At the [facility], you and your health needs are our priority. As part of our continuing mission to provide you with exceptional heart care, we have created designated Provider Care Teams. These Care Teams include your primary Cardiologist (physician) and Advanced Practice Providers (APPs- Physician Assistants and Nurse Practitioners) who all work together to provide you with the care you need, when you need it.   You may see any of the following providers on your designated Care Team at your next follow up: Dr Arvilla Meres Dr Marca Ancona Dr. Marcos Eke, NP Robbie Lis, [place] [facility] [place], [place] Brynda Peon, NP Karle Plumber, PharmD   Please be sure to bring in all your medications bottles to every appointment.    Thank you for choosing  [facility]

## 2022-12-29 ENCOUNTER — Telehealth: Payer: Self-pay | Admitting: *Deleted

## 2022-12-29 ENCOUNTER — Other Ambulatory Visit: Payer: Self-pay | Admitting: *Deleted

## 2022-12-29 DIAGNOSIS — I429 Cardiomyopathy, unspecified: Secondary | ICD-10-CM

## 2023-01-06 NOTE — Telephone Encounter (Signed)
PEER TO PEER required   [phone] opt 3 ref # [id number]  Member VH#[id number] Cpt code: 84132  Icd-10: D86.85  Routed to Dr.McLean and his nurse Emer Colleran,RN

## 2023-01-17 ENCOUNTER — Ambulatory Visit: Payer: Medicare Other | Admitting: Internal Medicine

## 2023-01-21 ENCOUNTER — Telehealth (HOSPITAL_COMMUNITY): Payer: Self-pay

## 2023-01-21 MED ORDER — AMLODIPINE BESYLATE 5 MG PO TABS: 5.0000 mg | ORAL_TABLET | Freq: Every day | ORAL | 11 refills | Status: DC

## 2023-01-21 NOTE — Telephone Encounter (Signed)
Patient advised and verbalized understanding. New Rx sent into patients pharmacy.  

## 2023-01-21 NOTE — Telephone Encounter (Signed)
Patients states that her blood pressure continues to remain elevated. Systolic runs between 69-179 and her diastolic runs between 80/89. She also complains of headaches but no other symptoms. Please advise.

## 2023-01-21 NOTE — Addendum Note (Signed)
Addended by: Rob Bunting R on: 01/21/2023 02:52 PM   Modules accepted: Orders

## 2023-01-27 NOTE — Progress Notes (Signed)
Cardiology Office Note  Date: 01/28/2023   ID: Kayla Bell, DOB 1945/10/10, MRN [id number]  History of Present Illness: Kayla Bell is a 77 y.o. female last seen in June 2023.  She has had interval follow-up in the heart failure clinic, I reviewed the note from April.  She is here for a follow-up visit.  Overall no major change in stamina.  Reports weight fluctuates by only a few pounds, she continues to use Lasix on a as as needed basis.  No exertional chest pain, NYHA class II dyspnea largely.  We went over her medications.  She does mention having recurring headaches and a feeling of weakness since she has been on BiDil.  Blood pressures have been elevated recently, Norvasc was added to baseline therapy including Coreg and Diovan.  Blood pressure measurement today actually looks somewhat better compared to her home checks.  I reviewed her recent lab work.  She does mention that the cardiac PET scan that had been planned at [facility] was apparently denied.  Physical Exam: VS:  BP (!) 145/70   Pulse 73   Ht 5' 6.5" (1.689 m)   Wt 171 lb 3.2 oz (77.7 kg)   SpO2 97%   BMI 27.22 kg/m , BMI Body mass index is 27.22 kg/m.  Wt Readings from Last 3 Encounters:  01/28/23 171 lb 3.2 oz (77.7 kg)  12/22/22 169 lb 9.6 oz (76.9 kg)  08/13/22 165 lb (74.8 kg)    General: Patient appears comfortable at rest. HEENT: Conjunctiva and lids normal. Neck: Supple, no elevated JVP or carotid bruits. Lungs: Clear to auscultation, nonlabored breathing at rest. Cardiac: Regular rate and rhythm, no S3 or significant systolic murmur. Extremities: Trace ankle edema.  ECG:  An ECG dated 08/13/2022 was personally reviewed today and demonstrated:  Sinus rhythm with decreased R wave progression.  Labwork: 05/12/2022: B Natriuretic Peptide 188.3; BUN 45; Creatinine, Ser 1.91; Potassium 4.5; Sodium 141  March 2024: Hemoglobin 11.3, platelets 214, potassium 4.4, BUN 47, creatinine 1.96 with  GFR 26  Other Studies Reviewed Today:  Cardiac MRI 01/18/2022: IMPRESSION: 1. Mildly dilated LV with severe basal septal hypokinesis and EF 37%. No LV hypertrophy.   2.  Normal RV size and systolic function, EF 57%.   3. Delayed enhancement showed diffuse mid-wall LGE in the basal anteroseptal wall segment and the basal inferoseptal wall segment.   4. Abnormal T1 signal in the basal septal wall with extracellular volume percentage elevated at 41%. Normal T1 signal in the lateral wall with ECV 27%.   5.  Normal T2 signal in the septal wall.   Nonischemic cardiomyopathy pattern. The LGE pattern is not classic for cardiac sarcoidosis (no subepicardial or transmural involvement), but sarcoidosis often involves the basal septum. Prior myocarditis is a possibility. The LGE pattern is also not classic for cardiac amyloidosis. ECV percentage is high in the basal septum where there is LGE, but ECV is in normal range in the lateral wall (not diffuse ECV percentage elevation).  Assessment and Plan:  1.  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  LVEF 37% by cardiac MRI in May 2023.  Workup so far is suggestive of either previous myocarditis or even potentially cardiac sarcoidosis.  Cardiac PET is planned, although it sounds like procedure was denied per discussion with patient, she just recently received a letter from [employer].  GDMT limited by Farxiga intolerance due to yeast infections and also inability to use MRA.  She has had headaches  and weakness on BiDil, plan is to stop BiDil and replace with hydralazine 50 mg 3 times a day to see if she tolerates this better.  Otherwise continue Diovan and Norvasc with as needed use of Lasix.  Keep follow-up in the heart failure clinic as scheduled.  2.  CKD stage IV with tendency toward mild hyperkalemia.  Recent lab work in March showed creatinine 1.96 with GFR 26 and potassium 4.4.  3.  Essential hypertension.  Medications are  being adjusted as discussed above.  4.  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  Unlikely to be sole cause for her cardiomyopathy.  Disposition:  Follow up  6 months.  Signed, Jonelle Sidle, M.D., F.A.C.C. [facility]

## 2023-01-28 ENCOUNTER — Encounter: Payer: Self-pay | Admitting: Cardiology

## 2023-01-28 ENCOUNTER — Ambulatory Visit: Payer: Medicare Other | Attending: Cardiology | Admitting: Cardiology

## 2023-01-28 VITALS — BP 145/70 | HR 73 | Ht 66.5 in | Wt 171.2 lb

## 2023-01-28 DIAGNOSIS — N184 Chronic kidney disease, stage 4 (severe): Secondary | ICD-10-CM | POA: Diagnosis not present

## 2023-01-28 DIAGNOSIS — I493 Ventricular premature depolarization: Secondary | ICD-10-CM

## 2023-01-28 DIAGNOSIS — I5020 Unspecified systolic (congestive) heart failure: Secondary | ICD-10-CM | POA: Diagnosis not present

## 2023-01-28 DIAGNOSIS — I10 Essential (primary) hypertension: Secondary | ICD-10-CM | POA: Diagnosis not present

## 2023-01-28 MED ORDER — HYDRALAZINE HCL 50 MG PO TABS: 50.0000 mg | ORAL_TABLET | Freq: Three times a day (TID) | ORAL | 3 refills | Status: DC

## 2023-01-28 NOTE — Patient Instructions (Addendum)
Medication Instructions:  Your physician has recommended you make the following change in your medication:  Stop bidil Start hydralazine 50 mg three times daily Continue other medications the same  Labwork: none  Testing/Procedures: none  Follow-Up: Your physician recommends that you schedule a follow-up appointment in: 6 months  Any Other Special Instructions Will Be Listed Below (If Applicable).  If you need a refill on your cardiac medications before your next appointment, please call your pharmacy.

## 2023-01-31 NOTE — Progress Notes (Deleted)
Kayla Bell, female    DOB: Aug 13, 1946    MRN: [id number]   Brief patient profile:  71  yobf  never smoker  referred to pulmonary clinic in [place]  04/12/2022 by Dr Shirlee Latch  for MPNs and chronic cough.   History of Present Illness  04/12/2022  Pulmonary/ 1st office eval/ Elie Gragert / [place] [facility]  - on ACEi  Chief Complaint  Patient presents with   Consult    Consult for abnormal CT done at [facility]   Dyspnea:  slowed by R knee ( s/p TKR 3 y  prior to OV ) more tired than sob with grocery shopping  Cough: x years, dry and with temp changes ( a/c will do it) / assoc nasal congestion  Sleep: bed is flat/ 2 pillows  SABA use: none  Rec Stop lisinopril and start valsartan 40 mg one daily in its place and your upper airway symptoms should resolve within 6 weeks  - we will need to recheck your BMET at end of next week here Call me the day after you have done the PET scan so I can review it> not done as of 07/09/2022       07/09/2022  f/u ov/[place] office/Ceniyah Thorp re: chornic cough much better  maint on  ppi only  Chief Complaint  Patient presents with   Follow-up    Discuss pet scan plan  (phone note from this week)   Dyspnea:  no doe but limited by R knee  Cough: improved  Sleeping: flat bed 2 pillows  no resp cc  SABA use: none  02: none  Covid status: all but new vax/ never infected  Rec No change rx   02/01/2023  f/u ov/[place] office/Odel Schmid re: *** maint on ***  No chief complaint on file.   Dyspnea:  *** Cough: *** Sleeping: *** SABA use: *** 02: *** Covid status: *** Lung cancer screening: ***   No obvious day to day or daytime variability or assoc excess/ purulent sputum or mucus plugs or hemoptysis or cp or chest tightness, subjective wheeze or overt sinus or hb symptoms.   *** without nocturnal  or early am exacerbation  of respiratory  c/o's or need for noct saba. Also denies any obvious fluctuation of symptoms with weather or environmental changes or  other aggravating or alleviating factors except as outlined above   No unusual exposure hx or h/o childhood pna/ asthma or knowledge of premature birth.  Current Allergies, Complete Past Medical History, Past Surgical History, Family History, and Social History were reviewed in [employer] record.  ROS  The following are not active complaints unless bolded Hoarseness, sore throat, dysphagia, dental problems, itching, sneezing,  nasal congestion or discharge of excess mucus or purulent secretions, ear ache,   fever, chills, sweats, unintended wt loss or wt gain, classically pleuritic or exertional cp,  orthopnea pnd or arm/hand swelling  or leg swelling, presyncope, palpitations, abdominal pain, anorexia, nausea, vomiting, diarrhea  or change in bowel habits or change in bladder habits, change in stools or change in urine, dysuria, hematuria,  rash, arthralgias, visual complaints, headache, numbness, weakness or ataxia or problems with walking or coordination,  change in mood or  memory.        No outpatient medications have been marked as taking for the 02/01/23 encounter (Appointment) with Nyoka Cowden, MD.                     Past Medical  History:  Diagnosis Date   Anemia    Anxiety    Arthritis    Cardiomyopathy (HCC)    Chronic kidney disease, stage 3 (HCC)    Depression    Essential hypertension    GERD (gastroesophageal reflux disease)    Gout    Hyperlipidemia    Type 2 diabetes mellitus (HCC)       Objective:    wts  02/01/2023        ***   07/09/22 166 lb 3.2 oz (75.4 kg)  05/12/22 165 lb 3.2 oz (74.9 kg)  04/12/22 167 lb 6.4 oz (75.9 kg)    Vital signs reviewed  02/01/2023  - Note at rest 02 sats  ***% on ***   General appearance:    ***                      Assessment

## 2023-02-01 ENCOUNTER — Ambulatory Visit: Payer: Medicare Other | Admitting: Internal Medicine

## 2023-02-02 ENCOUNTER — Encounter (HOSPITAL_COMMUNITY): Payer: Self-pay

## 2023-02-16 NOTE — Telephone Encounter (Signed)
Had normal Cardiolite in 2023, would change to Cardiolite as suspect PET is going to be denied regardless.

## 2023-02-18 ENCOUNTER — Telehealth (HOSPITAL_COMMUNITY): Payer: Self-pay | Admitting: Cardiology

## 2023-02-18 NOTE — Telephone Encounter (Signed)
Case [id number] with provider review at [facility] Cpt code 84696

## 2023-02-21 ENCOUNTER — Telehealth (HOSPITAL_COMMUNITY): Payer: Self-pay | Admitting: Emergency Medicine

## 2023-02-21 NOTE — Telephone Encounter (Signed)
Reaching out to patient to offer assistance regarding upcoming cardiac imaging study; pt verbalizes understanding of appt date/time, parking situation and where to check in, pre-test NPO status and medications ordered, and verified current allergies; name and call back number provided for further questions should they arise Katria Botts RN Navigator Cardiac Imaging [facility] [phone] office [phone] cell 

## 2023-02-22 ENCOUNTER — Telehealth (HOSPITAL_COMMUNITY): Payer: Self-pay | Admitting: Cardiology

## 2023-02-22 ENCOUNTER — Telehealth: Payer: Self-pay | Admitting: Internal Medicine

## 2023-02-22 NOTE — Telephone Encounter (Signed)
Dr Shirlee Latch this study is for Sarcoid not ischemic w/u, peer-to-peer time frame has expired, have again attempted to get auth and it's in clinical review with [facility]

## 2023-02-22 NOTE — Telephone Encounter (Signed)
Pt calling in bc she wants to inform Dr. Sherene Sires that her PET scan had to be rescheduled

## 2023-02-22 NOTE — Telephone Encounter (Signed)
PET SCAN PRE CERT CPT CODE 16109 DX  d86.85  Unable to complete peer to peer with original case #[id number] -out side of window for provider completion  New case submitted 6/7 Clinicals verbally submitted via phone call Case # [id number] -pending provider review  As of 02/22/23 @ 1527-status is still pending With medical director  Message to Rockwell Alexandria, RN cardiac scheduler to assist with r/s

## 2023-02-23 ENCOUNTER — Ambulatory Visit (HOSPITAL_COMMUNITY): Admission: RE | Admit: 2023-02-23 | Payer: Medicare Other | Source: Ambulatory Visit

## 2023-02-23 NOTE — Telephone Encounter (Signed)
Auth received Auth #[id number] Valid 02/22/23-04/08/23

## 2023-02-25 NOTE — Telephone Encounter (Signed)
Just an FYI PET scan has been rescheduled for 7/3

## 2023-03-08 ENCOUNTER — Other Ambulatory Visit (HOSPITAL_COMMUNITY): Payer: Medicare Other

## 2023-03-11 ENCOUNTER — Ambulatory Visit: Payer: Medicare Other | Admitting: Internal Medicine

## 2023-03-14 ENCOUNTER — Telehealth (HOSPITAL_COMMUNITY): Payer: Self-pay | Admitting: Emergency Medicine

## 2023-03-14 NOTE — Telephone Encounter (Signed)
Reaching out to patient to offer assistance regarding upcoming cardiac imaging study; pt verbalizes understanding of appt date/time, parking situation and where to check in, pre-test NPO status and medications ordered, and verified current allergies; name and call back number provided for further questions should they arise Kalisi Bevill RN Navigator Cardiac Imaging [facility] [phone] office [phone] cell 

## 2023-03-16 ENCOUNTER — Encounter (HOSPITAL_COMMUNITY)
Admission: RE | Admit: 2023-03-16 | Discharge: 2023-03-16 | Disposition: A | Discharge status: Home / Self Care | Payer: Medicare Other | Source: Ambulatory Visit | Attending: Cardiology | Admitting: Cardiology

## 2023-03-16 DIAGNOSIS — I429 Cardiomyopathy, unspecified: Secondary | ICD-10-CM

## 2023-03-16 LAB — NM PET CT MYOCARDIAL SARCOIDOSIS
Nuc Stress EF: 34 %
Rest Nuclear Isotope Dose: 20.2 mCi

## 2023-03-16 MED ORDER — RUBIDIUM RB82 GENERATOR (RUBYFILL)
20.8200 | PACK | Freq: Once | INTRAVENOUS | Status: AC
  Administered 2023-03-16: 20.21 via INTRAVENOUS

## 2023-03-16 MED ORDER — FLUDEOXYGLUCOSE F - 18 (FDG) INJECTION
8.7780 | Freq: Once | INTRAVENOUS | Status: AC
  Administered 2023-03-16: 8.778 via INTRAVENOUS

## 2023-03-18 ENCOUNTER — Telehealth (HOSPITAL_COMMUNITY): Payer: Self-pay

## 2023-03-18 NOTE — Telephone Encounter (Addendum)
Pt aware, agreeable, and verbalized understanding   ----- Message from Laurey Morale, MD sent at 03/17/2023 10:06 AM EDT ----- Ef 34%, no active inflammation to suggest cardiac sarcoidosis.

## 2023-03-27 NOTE — Progress Notes (Signed)
Kayla Bell, female    DOB: 1945/10/07    MRN: [id number]   Brief patient profile:  10  yobf  never smoker  referred to pulmonary clinic in [place]  04/12/2022 by Dr Shirlee Latch  for MPNs and chronic cough.   History of Present Illness  04/12/2022  Pulmonary/ 1st office eval/ Kayla Bell / [place] [facility]  - on ACEi  Chief Complaint  Patient presents with   Consult    Consult for abnormal CT done at [facility]   Dyspnea:  slowed by R knee ( s/p TKR 3 y  prior to OV ) more tired than sob with grocery shopping  Cough: x years, dry and with temp changes ( a/c will do it) / assoc nasal congestion  Sleep: bed is flat/ 2 pillows  SABA use: none  Rec Stop lisinopril and start valsartan 40 mg one daily in its place and your upper airway symptoms should resolve within 6 weeks  - we will need to recheck your BMET at end of next week here Call me the day after you have done the PET scan so I can review it> not done as of 07/09/2022       07/09/2022  f/u ov/[place] [facility]/Kayla Bell re: chronic  cough much better  maint on  ppi only  Chief Complaint  Patient presents with   Follow-up    Discuss pet scan plan  (phone note from this week)   Dyspnea:  no doe but limited by R knee  Cough: improved  Sleeping: flat bed 2 pillows  no resp cc  SABA use: none  02: none  Covid status: all but new vax/ never infeted  Rec No change rx     03/28/2023  f/u ov/[place] [facility]/Kayla Bell re: chronic cough  maint on no rx   Chief Complaint  Patient presents with   Pulmonary Nodules  Dyspnea:  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   Sleeping  without nocturnal  or early am exacerbation   of respiratory  c/o's or need for noct saba. Also denies any obvious fluctuation of symptoms with weather or environmental changes or other aggravating or alleviating factors except as outlined above   No unusual exposure hx or h/o childhood pna/ asthma or knowledge of premature birth.  Current Allergies, Complete Past Medical History, Past Surgical History, Family History, and Social History were reviewed in [employer] record.  ROS  The following are not active complaints unless bolded Hoarseness, sore throat, dysphagia, dental problems, itching, sneezing,  nasal congestion or discharge of excess mucus or purulent secretions, ear ache,   fever, chills, sweats, unintended wt loss or wt gain, classically pleuritic or exertional cp,  orthopnea pnd or arm/hand swelling  or leg swelling, presyncope, palpitations, abdominal pain, anorexia, nausea, vomiting, diarrhea  or change in bowel habits or change in bladder habits, change in stools or change in urine, dysuria, hematuria,  rash, arthralgias, visual complaints, headache, numbness, weakness or ataxia or problems with walking or coordination,  change in mood or  memory.        Current Meds  Medication Sig   allopurinol (ZYLOPRIM) 100 MG tablet Take 150 mg by mouth every evening.    amLODipine (NORVASC) 5 MG tablet Take 1 tablet (5 mg total) by mouth daily.   aspirin EC 81 MG tablet Take 81 mg by mouth daily. Swallow whole.   calcitRIOL (ROCALTROL) 0.25 MCG capsule Take 0.25 mcg by mouth every Monday, Wednesday, and Friday.   carvedilol (COREG) 25 MG tablet Take 25 mg by mouth 2 (two) times daily with a meal.   clonazePAM (KLONOPIN) 0.5 MG tablet Take 0.5 mg by mouth every 8 (eight) hours as needed for anxiety.    FEROSUL 325 (65 Fe) MG tablet Take 325 mg by mouth every morning.   fluticasone (FLONASE) 50 MCG/ACT nasal spray Place 1 spray into both nostrils daily.   furosemide (LASIX) 20 MG tablet Take 20 mg by mouth daily as  needed for fluid.   hydrALAZINE (APRESOLINE) 50 MG tablet Take 1 tablet (50 mg total) by mouth 3 (three) times daily.   Insulin Glargine (LANTUS [place]) Inject 30 Units into the skin daily.   Lancets (ONETOUCH DELICA PLUS LANCET33G) MISC USE 1 TO CHECK GLUCOSE TWICE DAILY   lansoprazole (PREVACID) 30 MG capsule Take 30 mg by mouth daily.   ONETOUCH ULTRA test strip    pravastatin (PRAVACHOL) 40 MG tablet Take 40 mg by mouth at bedtime.    valsartan (DIOVAN) 40 MG tablet Take 1 tablet (40 mg total) by mouth at bedtime. Take 1 tablet by mouth once daily                      Past Medical History:  Diagnosis Date   Anemia    Anxiety    Arthritis    Cardiomyopathy (HCC)    Chronic kidney disease, stage 3 (HCC)    Depression    Essential hypertension    GERD (gastroesophageal reflux disease)    Gout    Hyperlipidemia    Type 2 diabetes mellitus (HCC)       Objective:    wts  03/28/2023       169   07/09/22 166 lb 3.2 oz (75.4 kg)  05/12/22 165 lb 3.2 oz (74.9 kg)  04/12/22 167 lb 6.4 oz (75.9 kg)    Vital signs reviewed  03/28/2023  - Note at rest 02 sats  98% on RA   General appearance:    amb bf nad    HEENT : Oropharynx  clear          NECK :  without  apparent JVD/ palpable Nodes/TM    LUNGS: no acc muscle use,  Nl contour chest which is clear to A and P bilaterally without cough on insp or exp maneuvers   CV:  RRR  no s3 or murmur or increase in P2, and no edema   ABD:  soft and nontender with nl inspiratory excursion in the supine position. No bruits or organomegaly appreciated   MS:  Nl gait/ ext warm without deformities Or obvious joint restrictions  calf tenderness, cyanosis or clubbing    SKIN: warm and dry without lesions    NEURO:  alert, approp, nl sensorium with  no motor or cerebellar deficits apparent.           Assessment

## 2023-03-28 ENCOUNTER — Encounter: Payer: Self-pay | Admitting: Internal Medicine

## 2023-03-28 ENCOUNTER — Ambulatory Visit: Payer: Medicare Other | Admitting: Internal Medicine

## 2023-03-28 VITALS — BP 122/66 | HR 75 | Ht 66.5 in | Wt 169.0 lb

## 2023-03-28 DIAGNOSIS — R918 Other nonspecific abnormal finding of lung field: Secondary | ICD-10-CM

## 2023-03-28 NOTE — Patient Instructions (Signed)
Pulmonary follow up is as needed for worse cough or breathing (that is not explained by your heart condition.

## 2023-03-28 NOTE — Assessment & Plan Note (Signed)
Never smoker - 1st noted 02/25/21 up to 9 mm - repeat ct 03/31/22 up to  8 mm  with ACE =10   - 04/12/2022   Walked on RA  x  3  lap(s) =  approx 450  ft  @ fast pace, stopped due to end of study with lowest 02 sats 97% and no sob  - CT chest on coronary study 03/16/23 Multiple pulmonary nodules measure 8 mm or less in size, as on 03/31/2022. Additional CT chest without contrast in 12 months should be considered   In this setting (never smoker with no primary ca concern) the f/u CT in a year is purely optional at this point and pt can be followed here prn   Discussed in detail all the  indications, usual  risks and alternatives  relative to the benefits with patient who agrees to proceed with conservative f/u as outlined   Each maintenance medication was reviewed in detail including emphasizing most importantly the difference between maintenance and prns and under what circumstances the prns are to be triggered using an action plan format where appropriate.  Total time for H and P, chart review, counseling,  and generating customized AVS unique to this office visit / same day charting = 22 min

## 2023-04-01 DIAGNOSIS — N189 Chronic kidney disease, unspecified: Secondary | ICD-10-CM | POA: Diagnosis not present

## 2023-04-01 DIAGNOSIS — D631 Anemia in chronic kidney disease: Secondary | ICD-10-CM | POA: Diagnosis not present

## 2023-04-01 DIAGNOSIS — R809 Proteinuria, unspecified: Secondary | ICD-10-CM | POA: Diagnosis not present

## 2023-04-01 DIAGNOSIS — I129 Hypertensive chronic kidney disease with stage 1 through stage 4 chronic kidney disease, or unspecified chronic kidney disease: Secondary | ICD-10-CM | POA: Diagnosis not present

## 2023-04-07 DIAGNOSIS — N184 Chronic kidney disease, stage 4 (severe): Secondary | ICD-10-CM | POA: Diagnosis not present

## 2023-04-07 DIAGNOSIS — I5042 Chronic combined systolic (congestive) and diastolic (congestive) heart failure: Secondary | ICD-10-CM | POA: Diagnosis not present

## 2023-04-07 DIAGNOSIS — D638 Anemia in other chronic diseases classified elsewhere: Secondary | ICD-10-CM | POA: Diagnosis not present

## 2023-04-07 DIAGNOSIS — R809 Proteinuria, unspecified: Secondary | ICD-10-CM | POA: Diagnosis not present

## 2023-05-02 DIAGNOSIS — E1122 Type 2 diabetes mellitus with diabetic chronic kidney disease: Secondary | ICD-10-CM | POA: Diagnosis not present

## 2023-05-02 DIAGNOSIS — E781 Pure hyperglyceridemia: Secondary | ICD-10-CM | POA: Diagnosis not present

## 2023-05-02 DIAGNOSIS — N189 Chronic kidney disease, unspecified: Secondary | ICD-10-CM | POA: Diagnosis not present

## 2023-05-02 DIAGNOSIS — E1165 Type 2 diabetes mellitus with hyperglycemia: Secondary | ICD-10-CM | POA: Diagnosis not present

## 2023-05-05 DIAGNOSIS — I130 Hypertensive heart and chronic kidney disease with heart failure and stage 1 through stage 4 chronic kidney disease, or unspecified chronic kidney disease: Secondary | ICD-10-CM | POA: Diagnosis not present

## 2023-05-05 DIAGNOSIS — I10 Essential (primary) hypertension: Secondary | ICD-10-CM | POA: Diagnosis not present

## 2023-05-05 DIAGNOSIS — E1122 Type 2 diabetes mellitus with diabetic chronic kidney disease: Secondary | ICD-10-CM | POA: Diagnosis not present

## 2023-05-05 DIAGNOSIS — I119 Hypertensive heart disease without heart failure: Secondary | ICD-10-CM | POA: Diagnosis not present

## 2023-05-05 DIAGNOSIS — E1165 Type 2 diabetes mellitus with hyperglycemia: Secondary | ICD-10-CM | POA: Diagnosis not present

## 2023-05-05 DIAGNOSIS — E11649 Type 2 diabetes mellitus with hypoglycemia without coma: Secondary | ICD-10-CM | POA: Diagnosis not present

## 2023-05-05 DIAGNOSIS — I5022 Chronic systolic (congestive) heart failure: Secondary | ICD-10-CM | POA: Diagnosis not present

## 2023-05-05 DIAGNOSIS — N184 Chronic kidney disease, stage 4 (severe): Secondary | ICD-10-CM | POA: Diagnosis not present

## 2023-05-05 DIAGNOSIS — D509 Iron deficiency anemia, unspecified: Secondary | ICD-10-CM | POA: Diagnosis not present

## 2023-05-05 DIAGNOSIS — E7849 Other hyperlipidemia: Secondary | ICD-10-CM | POA: Diagnosis not present

## 2023-05-05 DIAGNOSIS — I43 Cardiomyopathy in diseases classified elsewhere: Secondary | ICD-10-CM | POA: Diagnosis not present

## 2023-06-03 DIAGNOSIS — N3090 Cystitis, unspecified without hematuria: Secondary | ICD-10-CM | POA: Diagnosis not present

## 2023-06-25 DIAGNOSIS — S161XXA Strain of muscle, fascia and tendon at neck level, initial encounter: Secondary | ICD-10-CM | POA: Diagnosis not present

## 2023-06-25 DIAGNOSIS — R030 Elevated blood-pressure reading, without diagnosis of hypertension: Secondary | ICD-10-CM | POA: Diagnosis not present

## 2023-07-02 DIAGNOSIS — E1122 Type 2 diabetes mellitus with diabetic chronic kidney disease: Secondary | ICD-10-CM | POA: Diagnosis not present

## 2023-07-02 DIAGNOSIS — Z23 Encounter for immunization: Secondary | ICD-10-CM | POA: Diagnosis not present

## 2023-07-07 DIAGNOSIS — N184 Chronic kidney disease, stage 4 (severe): Secondary | ICD-10-CM | POA: Diagnosis not present

## 2023-07-07 DIAGNOSIS — D649 Anemia, unspecified: Secondary | ICD-10-CM | POA: Diagnosis not present

## 2023-07-13 DIAGNOSIS — R809 Proteinuria, unspecified: Secondary | ICD-10-CM | POA: Diagnosis not present

## 2023-07-13 DIAGNOSIS — I5042 Chronic combined systolic (congestive) and diastolic (congestive) heart failure: Secondary | ICD-10-CM | POA: Diagnosis not present

## 2023-07-13 DIAGNOSIS — D638 Anemia in other chronic diseases classified elsewhere: Secondary | ICD-10-CM | POA: Diagnosis not present

## 2023-07-13 DIAGNOSIS — N184 Chronic kidney disease, stage 4 (severe): Secondary | ICD-10-CM | POA: Diagnosis not present

## 2023-07-17 DIAGNOSIS — J209 Acute bronchitis, unspecified: Secondary | ICD-10-CM | POA: Diagnosis not present

## 2023-07-17 DIAGNOSIS — R030 Elevated blood-pressure reading, without diagnosis of hypertension: Secondary | ICD-10-CM | POA: Diagnosis not present

## 2023-07-17 DIAGNOSIS — U071 COVID-19: Secondary | ICD-10-CM | POA: Diagnosis not present

## 2023-07-17 DIAGNOSIS — J9801 Acute bronchospasm: Secondary | ICD-10-CM | POA: Diagnosis not present

## 2023-08-09 ENCOUNTER — Encounter: Payer: Self-pay | Admitting: Cardiology

## 2023-08-09 ENCOUNTER — Ambulatory Visit: Payer: Medicare Other | Attending: Cardiology | Admitting: Cardiology

## 2023-08-09 VITALS — BP 114/58 | HR 78 | Ht 66.0 in | Wt 167.2 lb

## 2023-08-09 DIAGNOSIS — N184 Chronic kidney disease, stage 4 (severe): Secondary | ICD-10-CM | POA: Diagnosis not present

## 2023-08-09 DIAGNOSIS — I5020 Unspecified systolic (congestive) heart failure: Secondary | ICD-10-CM | POA: Diagnosis not present

## 2023-08-09 DIAGNOSIS — I493 Ventricular premature depolarization: Secondary | ICD-10-CM | POA: Diagnosis not present

## 2023-08-09 DIAGNOSIS — I10 Essential (primary) hypertension: Secondary | ICD-10-CM | POA: Diagnosis not present

## 2023-08-09 NOTE — Patient Instructions (Addendum)

## 2023-08-09 NOTE — Progress Notes (Signed)
Cardiology Office Note  Date: 08/09/2023   ID: MYKIAH MAUNG, DOB 1946/01/23, MRN [id number]  History of Present Illness: Kayla Bell is a 77 y.o. female last seen in May.  She is here for a follow-up visit.  Reports NYHA class II dyspnea, no exertional chest pain, no palpitations or syncope.  She uses Lasix no more than once a week mainly for a feeling of abdominal fullness.  Reports no peripheral edema, no orthopnea or PND.  Her weight is actually down a few pounds.  I reviewed her medications.  Current cardiac regimen includes aspirin, Norvasc, Coreg, hydralazine, Diovan, Pravachol, and Lasix.  She did ultimately undergo cardiac PET CT imaging in July which did not suggest sarcoidosis or active myocardial inflammation.  Coronary calcium present in the LAD distribution and LVEF 34% at that time.  Incidentally noted pulmonary nodules were seen as well, small and unchanged.  Physical Exam: VS:  BP (!) 114/58 (BP Location: Right Arm)   Pulse 78   Ht 5\' 6"  (1.676 m)   Wt 167 lb 3.2 oz (75.8 kg)   SpO2 98%   BMI 26.99 kg/m , BMI Body mass index is 26.99 kg/m.  Wt Readings from Last 3 Encounters:  08/09/23 167 lb 3.2 oz (75.8 kg)  03/28/23 169 lb (76.7 kg)  01/28/23 171 lb 3.2 oz (77.7 kg)    General: Patient appears comfortable at rest. HEENT: Conjunctiva and lids normal. Neck: Supple, no elevated JVP or carotid bruits. Lungs: Clear to auscultation, nonlabored breathing at rest. Cardiac: Regular rate and rhythm, no S3 or significant systolic murmur. Extremities: No pitting edema.  ECG:  An ECG dated 08/13/2022 was personally reviewed today and demonstrated:  Sinus rhythm with decreased R wave progression.  Labwork:  October 2024: Hemoglobin 10.5, platelets 223, potassium 4.3, BUN 46, creatinine 2.2, GFR 23  Other Studies Reviewed Today:  Cardiac PET CT imaging 03/16/2023:     FDG uptake was not observed. LV perfusion is normal.   Left ventricular function is  abnormal. Global function is moderately reduced. EF: 34 %. End diastolic cavity size is normal. End systolic cavity size is mildly enlarged.   Coronary calcium was present on the attenuation correction CT images. Moderate coronary calcifications were present. Coronary calcifications were present in the left anterior descending artery distribution(s).   FDG uptake findings are inconsistent with active myocardial inflammation/sarcoidosis.   Electronically signed by Epifanio Lesches, MD   CLINICAL DATA:  This over-read does not include interpretation of cardiac or coronary anatomy or pathology. The interpretation by the cardiologist is attached.   COMPARISON:  CT chest 03/31/2022.   FINDINGS: Scout view is unremarkable. Atherosclerotic calcification of the aorta. Numerous bilateral pulmonary nodules measure up to 8 mm along the minor fissure (3/18), as on 03/31/2022. Subsegmental volume loss in the anterolateral left lower lobe.   IMPRESSION: 1. Multiple pulmonary nodules measure 8 mm or less in size, as on 03/31/2022. Additional CT chest without contrast in 12 months should be considered, as malignancy cannot be excluded. This recommendation follows the consensus statement: Guidelines for Management of Incidental Pulmonary Nodules Detected on CT Images: From the Fleischner Society 2017; Radiology 2017; 284:228-243. 2.  Aortic atherosclerosis (ICD10-I70.0).  Assessment and Plan:  1.  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  Imaging at that time did not suggest sarcoidosis or active myocardial inflammation.  Coronary calcification present in the LAD distribution.  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 She would have high risk for contrast-induced nephropathy in the setting of CKD stage IV, and in the absence of limiting angina, would continue with medical therapy. GDMT limited by Farxiga  intolerance due to yeast infections and also inability to use MRA.  Currently stable with NYHA class II symptoms.  Plan to update echocardiogram.  Continue aspirin, Coreg, hydralazine, Diovan, Pravachol, and as needed Lasix.   2.  CKD stage IV.  Lab work from October revealed potassium 4.3 and creatinine 2.2 (GFR 23).  She continues to follow with nephrology.   3.  Primary hypertension.  Blood pressure well-controlled today.  She is also on Norvasc.  4.  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  Unlikely to be sole cause for her cardiomyopathy.  Disposition:  Follow up  6 months.  Signed, Jonelle Sidle, M.D., F.A.C.C. [facility]

## 2023-08-23 ENCOUNTER — Ambulatory Visit: Payer: Medicare Other | Attending: Cardiology

## 2023-08-23 DIAGNOSIS — I5020 Unspecified systolic (congestive) heart failure: Secondary | ICD-10-CM | POA: Diagnosis not present

## 2023-08-24 LAB — ECHOCARDIOGRAM COMPLETE
AR max vel: 1.64 cm2
AV Area VTI: 1.59 cm2
AV Area mean vel: 1.69 cm2
AV Mean grad: 5 mm[Hg]
AV Peak grad: 9.5 mm[Hg]
Ao pk vel: 1.54 m/s
Area-P 1/2: 5.13 cm2
Calc EF: 38.2 %
Est EF: 40
MV M vel: 5.54 m/s
MV Peak grad: 122.8 mm[Hg]
S' Lateral: 4.8 cm
Single Plane A2C EF: 38.3 %
Single Plane A4C EF: 33.8 %

## 2023-08-25 ENCOUNTER — Telehealth: Payer: Self-pay | Admitting: Cardiology

## 2023-08-25 NOTE — Telephone Encounter (Signed)
Patient stated she was returning a staff call.

## 2023-08-25 NOTE — Telephone Encounter (Signed)
Advised patient that no one has called from our office and results are not ready yet.  Patient states she was just confused and made sure she did not miss anything.

## 2023-09-02 DIAGNOSIS — E1165 Type 2 diabetes mellitus with hyperglycemia: Secondary | ICD-10-CM | POA: Diagnosis not present

## 2023-09-02 DIAGNOSIS — D649 Anemia, unspecified: Secondary | ICD-10-CM | POA: Diagnosis not present

## 2023-09-02 DIAGNOSIS — D519 Vitamin B12 deficiency anemia, unspecified: Secondary | ICD-10-CM | POA: Diagnosis not present

## 2023-09-02 DIAGNOSIS — E1122 Type 2 diabetes mellitus with diabetic chronic kidney disease: Secondary | ICD-10-CM | POA: Diagnosis not present

## 2023-09-02 DIAGNOSIS — I130 Hypertensive heart and chronic kidney disease with heart failure and stage 1 through stage 4 chronic kidney disease, or unspecified chronic kidney disease: Secondary | ICD-10-CM | POA: Diagnosis not present

## 2023-09-02 DIAGNOSIS — D529 Folate deficiency anemia, unspecified: Secondary | ICD-10-CM | POA: Diagnosis not present

## 2023-09-02 DIAGNOSIS — E7849 Other hyperlipidemia: Secondary | ICD-10-CM | POA: Diagnosis not present

## 2023-09-08 DIAGNOSIS — R809 Proteinuria, unspecified: Secondary | ICD-10-CM | POA: Diagnosis not present

## 2023-09-08 DIAGNOSIS — D638 Anemia in other chronic diseases classified elsewhere: Secondary | ICD-10-CM | POA: Diagnosis not present

## 2023-09-08 DIAGNOSIS — N184 Chronic kidney disease, stage 4 (severe): Secondary | ICD-10-CM | POA: Diagnosis not present

## 2023-09-08 DIAGNOSIS — I5042 Chronic combined systolic (congestive) and diastolic (congestive) heart failure: Secondary | ICD-10-CM | POA: Diagnosis not present

## 2023-09-12 DIAGNOSIS — D509 Iron deficiency anemia, unspecified: Secondary | ICD-10-CM | POA: Diagnosis not present

## 2023-09-12 DIAGNOSIS — E11649 Type 2 diabetes mellitus with hypoglycemia without coma: Secondary | ICD-10-CM | POA: Diagnosis not present

## 2023-09-12 DIAGNOSIS — Z23 Encounter for immunization: Secondary | ICD-10-CM | POA: Diagnosis not present

## 2023-09-12 DIAGNOSIS — I130 Hypertensive heart and chronic kidney disease with heart failure and stage 1 through stage 4 chronic kidney disease, or unspecified chronic kidney disease: Secondary | ICD-10-CM | POA: Diagnosis not present

## 2023-09-12 DIAGNOSIS — E1122 Type 2 diabetes mellitus with diabetic chronic kidney disease: Secondary | ICD-10-CM | POA: Diagnosis not present

## 2023-09-12 DIAGNOSIS — I10 Essential (primary) hypertension: Secondary | ICD-10-CM | POA: Diagnosis not present

## 2023-09-12 DIAGNOSIS — I119 Hypertensive heart disease without heart failure: Secondary | ICD-10-CM | POA: Diagnosis not present

## 2023-09-12 DIAGNOSIS — M109 Gout, unspecified: Secondary | ICD-10-CM | POA: Diagnosis not present

## 2023-09-12 DIAGNOSIS — E7849 Other hyperlipidemia: Secondary | ICD-10-CM | POA: Diagnosis not present

## 2023-09-12 DIAGNOSIS — E1165 Type 2 diabetes mellitus with hyperglycemia: Secondary | ICD-10-CM | POA: Diagnosis not present

## 2023-09-12 DIAGNOSIS — I5022 Chronic systolic (congestive) heart failure: Secondary | ICD-10-CM | POA: Diagnosis not present

## 2023-12-14 ENCOUNTER — Other Ambulatory Visit: Payer: Self-pay | Admitting: Cardiology

## 2023-12-23 DIAGNOSIS — E875 Hyperkalemia: Secondary | ICD-10-CM | POA: Diagnosis not present

## 2023-12-23 DIAGNOSIS — N189 Chronic kidney disease, unspecified: Secondary | ICD-10-CM | POA: Diagnosis not present

## 2023-12-23 DIAGNOSIS — R5383 Other fatigue: Secondary | ICD-10-CM | POA: Diagnosis not present

## 2023-12-23 DIAGNOSIS — Z13220 Encounter for screening for lipoid disorders: Secondary | ICD-10-CM | POA: Diagnosis not present

## 2023-12-23 DIAGNOSIS — E11649 Type 2 diabetes mellitus with hypoglycemia without coma: Secondary | ICD-10-CM | POA: Diagnosis not present

## 2023-12-25 IMAGING — MR MR CARD MORPHOLOGY WO/W CM
45 of 48 series · 45 of 48 positions shown · IV contrast (gadavist)
Comparison: none

CLINICAL DATA: ?Cardiac amyloidosis

EXAM:
CARDIAC MRI
TECHNIQUE: The patient was scanned on a 1.5 Tesla GE magnet. A dedicated
cardiac coil was used. Functional imaging was done using Fiesta
sequences. [DATE], and 4 chamber views were done to assess for RWMA's.
Modified Schorschi rule using a short axis stack was used to
calculate an ejection fraction on a dedicated work station using
[employer]le software. The patient received 8 cc of Gadavist. After 10
minutes inversion recovery sequences were used to assess for
infiltration and scar tissue.
CONTRAST:  Gadavist 8 cc

[Series 4: t2_haste_db_tra_bh · axial · 8.0mm · 1.41mm/px · 1 of 16 slices shown]
[im 1/16]
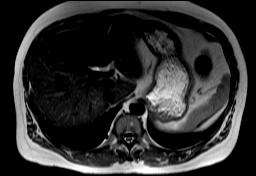

[Series 8: bSSFP · oblique · 8.0mm · 1.61mm/px · 1 of 25 slices shown (1 of 20)]
[im 1/25]
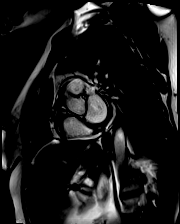

[Series 9: bSSFP · oblique · 8.0mm · 1.61mm/px · 1 of 25 slices shown (2 of 20)]
[im 1/25]
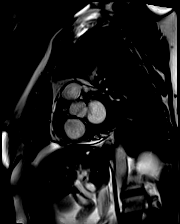

[Series 10: bSSFP · oblique · 8.0mm · 1.61mm/px · 1 of 25 slices shown (3 of 20)]
[im 1/25]
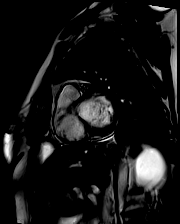

[Series 11: bSSFP · oblique · 8.0mm · 1.61mm/px · 1 of 25 slices shown (4 of 20)]
[im 1/25]
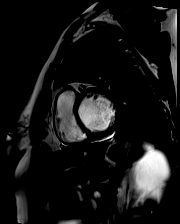

[Series 12: bSSFP · oblique · 8.0mm · 1.61mm/px · 1 of 25 slices shown (5 of 20)]
[im 1/25]
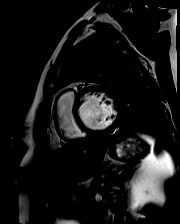

[Series 13: bSSFP · oblique · 8.0mm · 1.61mm/px · 1 of 25 slices shown (6 of 20)]
[im 1/25]
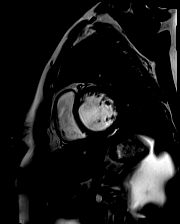

[Series 14: bSSFP · oblique · 8.0mm · 1.61mm/px · 1 of 25 slices shown (7 of 20)]
[im 1/25]
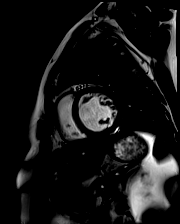

[Series 15: bSSFP · oblique · 8.0mm · 1.61mm/px · 1 of 25 slices shown (8 of 20)]
[im 1/25]
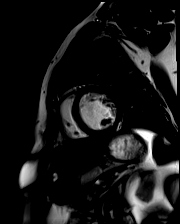

[Series 16: bSSFP · oblique · 8.0mm · 1.61mm/px · 1 of 25 slices shown (9 of 20)]
[im 1/25]
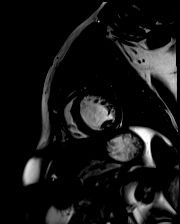

[Series 17: bSSFP · oblique · 8.0mm · 1.61mm/px · 1 of 25 slices shown (10 of 20)]
[im 1/25]
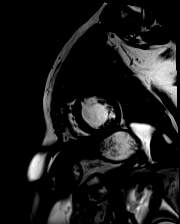

[Series 18: bSSFP · oblique · 8.0mm · 1.61mm/px · 1 of 25 slices shown (11 of 20)]
[im 1/25]
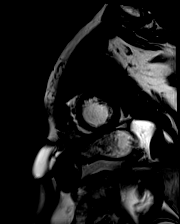

[Series 19: bSSFP · oblique · 8.0mm · 1.61mm/px · 1 of 25 slices shown (12 of 20)]
[im 1/25]
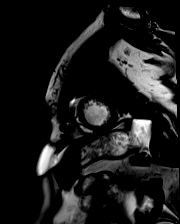

[Series 20: bSSFP · oblique · 8.0mm · 1.61mm/px · 1 of 25 slices shown (13 of 20)]
[im 1/25]
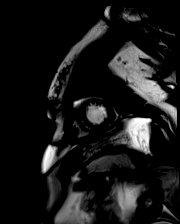

[Series 21: bSSFP · oblique · 8.0mm · 1.61mm/px · 1 of 25 slices shown (14 of 20)]
[im 1/25]
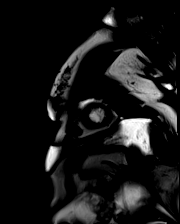

[Series 22: bSSFP · oblique · 8.0mm · 1.61mm/px · 1 of 25 slices shown (15 of 20)]
[im 1/25]
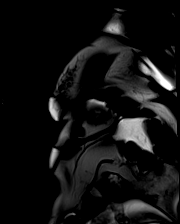

[Series 23: bSSFP · oblique · 8.0mm · 1.61mm/px · 1 of 25 slices shown (16 of 20)]
[im 1/25]
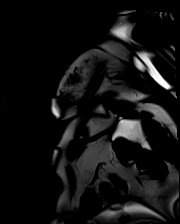

[Series 24: bSSFP · oblique · 8.0mm · 1.61mm/px · 1 of 25 slices shown (17 of 20)]
[im 1/25]
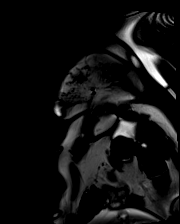

[Series 25: bSSFP · axial · 6.0mm · 1.41mm/px · 1 of 25 slices shown (18 of 20)]
[im 1/25]
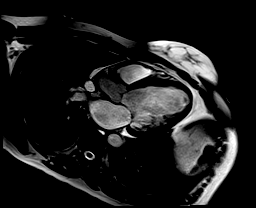

[Series 26: bSSFP · oblique · 6.0mm · 1.41mm/px · 1 of 25 slices shown (19 of 20)]
[im 1/25]
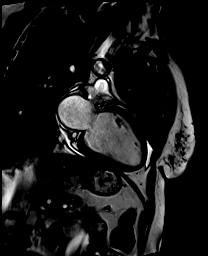

[Series 27: bSSFP · axial · 6.0mm · 1.41mm/px · 1 of 25 slices shown (20 of 20)]
[im 1/25]
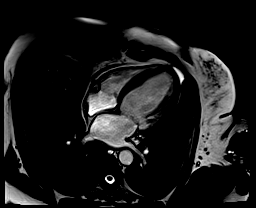

[Series 28: cine_trufi_short axis_cs_2_shot · oblique · 8.0mm · 1.48mm/px · 1 of 25 slices shown (1 of 18)]
[im 1/25]
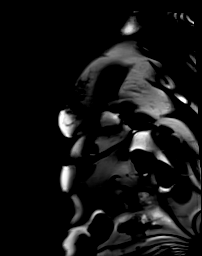

[Series 28: cine_trufi_short axis_cs_2_shot · oblique · 8.0mm · 1.48mm/px · 1 of 25 slices shown (2 of 18)]
[im 1/25]
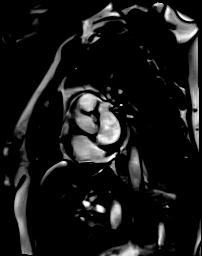

[Series 28: cine_trufi_short axis_cs_2_shot · oblique · 8.0mm · 1.48mm/px · 1 of 25 slices shown (3 of 18)]
[im 1/25]
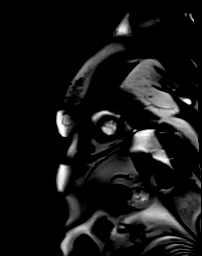

[Series 28: cine_trufi_short axis_cs_2_shot · oblique · 8.0mm · 1.48mm/px · 1 of 25 slices shown (4 of 18)]
[im 1/25]
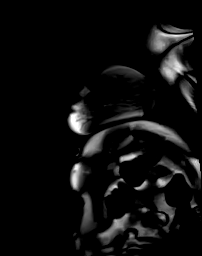

[Series 28: cine_trufi_short axis_cs_2_shot · oblique · 8.0mm · 1.48mm/px · 1 of 25 slices shown (5 of 18)]
[im 1/25]
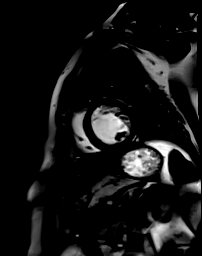

[Series 28: cine_trufi_short axis_cs_2_shot · oblique · 8.0mm · 1.48mm/px · 1 of 25 slices shown (6 of 18)]
[im 1/25]
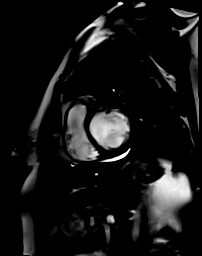

[Series 28: cine_trufi_short axis_cs_2_shot · oblique · 8.0mm · 1.48mm/px · 1 of 25 slices shown (7 of 18)]
[im 1/25]
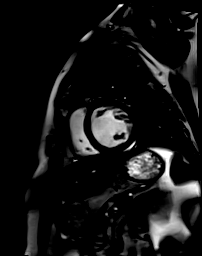

[Series 28: cine_trufi_short axis_cs_2_shot · oblique · 8.0mm · 1.48mm/px · 1 of 25 slices shown (8 of 18)]
[im 1/25]
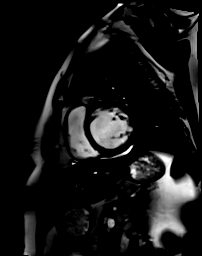

[Series 28: cine_trufi_short axis_cs_2_shot · oblique · 8.0mm · 1.48mm/px · 1 of 25 slices shown (9 of 18)]
[im 1/25]
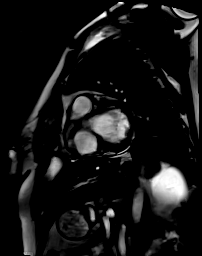

[Series 28: cine_trufi_short axis_cs_2_shot · oblique · 8.0mm · 1.48mm/px · 1 of 25 slices shown (10 of 18)]
[im 1/25]
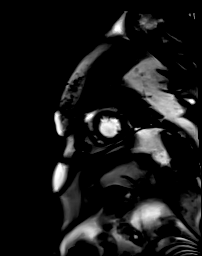

[Series 28: cine_trufi_short axis_cs_2_shot · oblique · 8.0mm · 1.48mm/px · 1 of 25 slices shown (11 of 18)]
[im 1/25]
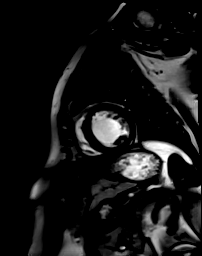

[Series 28: cine_trufi_short axis_cs_2_shot · oblique · 8.0mm · 1.48mm/px · 1 of 25 slices shown (12 of 18)]
[im 1/25]
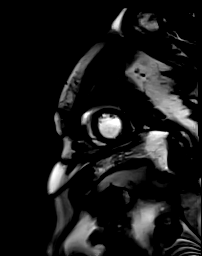

[Series 28: cine_trufi_short axis_cs_2_shot · oblique · 8.0mm · 1.48mm/px · 1 of 25 slices shown (13 of 18)]
[im 1/25]
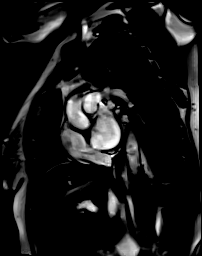

[Series 28: cine_trufi_short axis_cs_2_shot · oblique · 8.0mm · 1.48mm/px · 1 of 25 slices shown (14 of 18)]
[im 1/25]
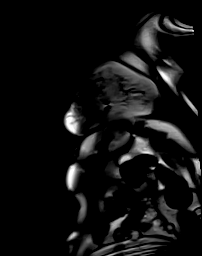

[Series 28: cine_trufi_short axis_cs_2_shot · oblique · 8.0mm · 1.48mm/px · 1 of 25 slices shown (15 of 18)]
[im 1/25]
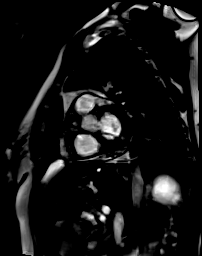

[Series 28: cine_trufi_short axis_cs_2_shot · oblique · 8.0mm · 1.48mm/px · 1 of 25 slices shown (16 of 18)]
[im 1/25]
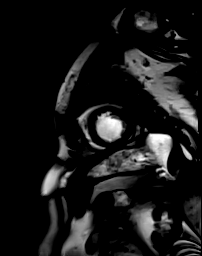

[Series 28: cine_trufi_short axis_cs_2_shot · oblique · 8.0mm · 1.48mm/px · 1 of 25 slices shown (17 of 18)]
[im 1/25]
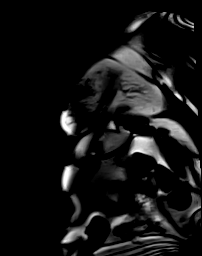

[Series 28: cine_trufi_short axis_cs_2_shot · oblique · 8.0mm · 1.48mm/px · 1 of 25 slices shown (18 of 18)]
[im 1/25]
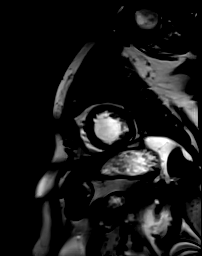

[Series 29: (id)_long_t1 · oblique · 8.0mm · 1.56mm/px · 1 of 24 slices shown]
[im 1/24]
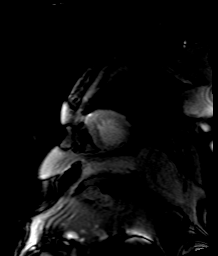

[Series 30: (id)_long_t1_moco · oblique · 8.0mm · 1.56mm/px · 1 of 24 slices shown]
[im 1/24]
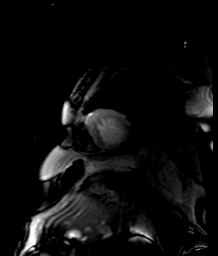

[Series 31: (id)_long_t1_moco_t1 · oblique · 8.0mm · 1.56mm/px · 1 of 6 slices shown]
[im 1/6]
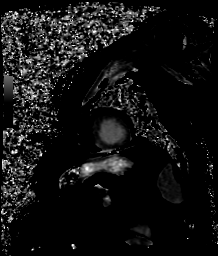

[Series 33: (id)_trufi · oblique · 8.0mm · 2.08mm/px · 1 of 9 slices shown]
[im 1/9]
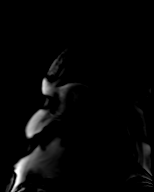

[Series 34: (id)_trufi_moco · oblique · 8.0mm · 2.08mm/px · 1 of 9 slices shown]
[im 1/9]
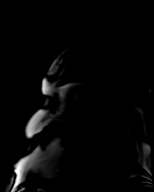

[Series 35: (id)_trufi_moco_t2 · oblique · 8.0mm · 2.08mm/px · 1 of 3 slices shown]
[im 1/3]
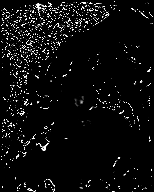

[45 of 48 positions shown; findings below may reference images not displayed]

FINDINGS: Coarse interstitial markings noted on limited images of the lung
fields.

Mildly dilated left ventricle with normal wall thickness. Basal
septal severe hypokinesis, LV EF 37%. Normal right ventricular size
and systolic function, EF 57%. Mild left atrial enlargement. Normal
right atrium. Mild mitral regurgitation visually. Trileaflet aortic
valve with no significant regurgitation or stenosis.

On delayed enhancement imaging, there is mid-myocardial late
gadolinium enhancement (LGE) in the basal anteroseptal and basal
inferoseptal wall segments.

MEASUREMENTS:
MEASUREMENTS
LVEDV 211 mL

LVEDVi 112 mL/m2

LVSV 78 mL

LVEF 37%

RVEDV 131 mL

RVEDVi 70 mL/m2

RVSV 75 mL

RVEF 57%

T1 4161, ECV 41% (septum)

T1 997, ECV 27% (lateral wall)

T2 49 in septum
IMPRESSION: 1. Mildly dilated LV with severe basal septal hypokinesis and EF
37%. No LV hypertrophy.

2.  Normal RV size and systolic function, EF 57%.

3. Delayed enhancement showed diffuse mid-wall LGE in the basal
anteroseptal wall segment and the basal inferoseptal wall segment.

4. Abnormal T1 signal in the basal septal wall with extracellular
volume percentage elevated at 41%. Normal T1 signal in the lateral
wall with ECV 27%.

5.  Normal T2 signal in the septal wall.

Nonischemic cardiomyopathy pattern. The LGE pattern is not classic
for cardiac sarcoidosis (no subepicardial or transmural
involvement), but sarcoidosis often involves the basal septum. Prior
myocarditis is a possibility. The LGE pattern is also not classic
for cardiac amyloidosis. ECV percentage is high in the basal septum
where there is LGE, but ECV is in normal range in the lateral wall
(not diffuse ECV percentage elevation).

Twaha Shimul

## 2023-12-28 DIAGNOSIS — R809 Proteinuria, unspecified: Secondary | ICD-10-CM | POA: Diagnosis not present

## 2023-12-28 DIAGNOSIS — I5042 Chronic combined systolic (congestive) and diastolic (congestive) heart failure: Secondary | ICD-10-CM | POA: Diagnosis not present

## 2023-12-28 DIAGNOSIS — N184 Chronic kidney disease, stage 4 (severe): Secondary | ICD-10-CM | POA: Diagnosis not present

## 2023-12-28 DIAGNOSIS — D638 Anemia in other chronic diseases classified elsewhere: Secondary | ICD-10-CM | POA: Diagnosis not present

## 2024-01-11 DIAGNOSIS — R5383 Other fatigue: Secondary | ICD-10-CM | POA: Diagnosis not present

## 2024-01-11 DIAGNOSIS — E782 Mixed hyperlipidemia: Secondary | ICD-10-CM | POA: Diagnosis not present

## 2024-01-11 DIAGNOSIS — Z1389 Encounter for screening for other disorder: Secondary | ICD-10-CM | POA: Diagnosis not present

## 2024-01-11 DIAGNOSIS — Z0000 Encounter for general adult medical examination without abnormal findings: Secondary | ICD-10-CM | POA: Diagnosis not present

## 2024-01-11 DIAGNOSIS — I5022 Chronic systolic (congestive) heart failure: Secondary | ICD-10-CM | POA: Diagnosis not present

## 2024-01-11 DIAGNOSIS — I119 Hypertensive heart disease without heart failure: Secondary | ICD-10-CM | POA: Diagnosis not present

## 2024-01-11 DIAGNOSIS — Z0001 Encounter for general adult medical examination with abnormal findings: Secondary | ICD-10-CM | POA: Diagnosis not present

## 2024-01-16 ENCOUNTER — Other Ambulatory Visit (HOSPITAL_COMMUNITY): Payer: Self-pay | Admitting: Family Medicine

## 2024-01-30 ENCOUNTER — Encounter (HOSPITAL_COMMUNITY)

## 2024-02-04 ENCOUNTER — Other Ambulatory Visit (HOSPITAL_COMMUNITY): Payer: Self-pay | Admitting: Cardiology

## 2024-02-04 DIAGNOSIS — I5020 Unspecified systolic (congestive) heart failure: Secondary | ICD-10-CM

## 2024-02-13 ENCOUNTER — Telehealth (HOSPITAL_COMMUNITY): Payer: Self-pay

## 2024-02-13 NOTE — Progress Notes (Signed)
 PCP: Orest Bio, MD Cardiology: Dr. Londa Rival Nephrology: Dr. Carrolyn Clan HF Cardiology: Dr. Mitzie Anda  78 y.o. with history of CKD stage 4, chronic HF with mid range EF, type 2 diabetes, and HTN was referred by Dr. Londa Rival for evaluation of possible cardiac amyloidosis.  Patient has had a long-standing mild cardiomyopathy, with most recent echo in 1/23 showing EF 45-50%, mild LV dilation, mild LVH, GLS -12.8% with apical sparing (?amyloidosis), normal RV, trivial pericardial effusion, moderate MR. She had a PYP scan in 2/23 that was equivocal with Grade 2, H/CL 1-1.5.  Remote ischemic workup was negative (Cardiolite in 2002).  Baseline creatinine was around 2.2. She is off Entresto  and spironolactone  with elevated creatinine and hyperkalemia.   Cardiolite in 4/23 showed no ischemia/infarction, PVCs noted.  [employer] gene testing for hereditary TTR cardiac amyloidosis was negative.  Cardiac MRI in 5/23 showed mild LV dilation with EF 37%, severe basal septal hypokinesis, no LVH, normal RV with RVEF 57%, diffuse mid wall LGE in the basal anteroseptum and basal inferoseptum, ECV 41% basal septum and 27% lateral wall.   She stopped Farxiga  due to recurrent yeast infections.   She had repeat HRCT in 7/23 again showing innumerable small nodules.  They were stable and likely benign.  She saw Dr. Waymond Hailey who did not see definite signs of pulmonary sarcoidosis.  ACEI was stopped and ARB begun.   Cardiac PET 7/24 showed EF 34%, no active inflammation to suggest cardiac sarcoidosis. Echo 12/24 showed EF 40%, normal RV, moderate MR  Today she returns for HF follow up. Overall feeling fine. She is more limited by fatigue and knee OA. Occasional dizziness, no falls.  Denies increasing SOB, palpitations, abnormal bleeding, CP, edema, or PND/Orthopnea. Appetite ok. Weight at home 168 pounds. Taking all medications. Takes Lasix  1-2x/week. Follows with Nephrology and Dr. Londa Rival.  ECG (personally reviewed): none ordered  today  Labs (2/23): K 5.7, creatinine 2.2, urine immunofixation negative, SPEP negative.  Labs (3/23): K 4.6 Labs (4/23): K 5.3, creatinine 2.2 Labs (6/23): ACE level normal, BNP 124 Labs (8/23): K 4.8, creatinine 1.86 => 1.91, BNP 188 Labs (3/24): K 4.4, creatinine 1.96, hgb 11.3 Labs (10/24): K 4.3, creatinine 2.21  PMH: 1. CKD stage 4 2. HTN 3. Depression 4. Gout  5. Type 2 diabetes 6. Chronic HF with mid range EF: Uncertain etiology. - Negative stress test around 2002 at [facility].  - Echo (9/14): EF 35-40% - Echo (1/22): EF 40-45% - Echo (1/23): EF 45-50%, mild LV dilation, mild LVH, GLS -12.8% with apical sparing (?amyloidosis), normal RV, trivial pericardial effusion, moderate MR.  - PYP scan (2/23): Grade 2, H/CL 1-1.5 (equivocal). [employer]ae gene testing for hereditary TTR cardiac amyloidosis was negative.  - Cardiolite (4/23): no ischemia/infarction, PVCs noted. - Cardiac MRI (5/23): mild LV dilation with EF 37%, severe basal septal hypokinesis, no LVH, normal RV with RVEF 57%, diffuse mid wall LGE in the basal anteroseptum and basal inferoseptum, ECV 41% basal septum and 27% lateral wall.  - Cardiac PET (7/24): EF 34%, no active inflammation - Echo (12/24): EF 40%, normal RV, moderate MR. 7. Pulmonary nodules: CT chest at [facility] in 6/22 showed "innumerable" pulmonary nodules.  - HRCT (7/23): Innumerable small nodules, stable and likely benign.  8. PVCs: Zio monitor (7/23) with 9% PVCs  Social History   Socioeconomic History   Marital status: Married    Spouse name: Not on file   Number of children: Not on file   Years of education: Not  on file   Highest education level: Not on file  Occupational History   Not on file  Tobacco Use   Smoking status: Never   Smokeless tobacco: Never  Vaping Use   Vaping status: Never Used  Substance and Sexual Activity   Alcohol  use: Never    Alcohol /week: 0.0 standard drinks of alcohol    Drug use: Never   Sexual  activity: Not on file  Other Topics Concern   Not on file  Social History Narrative   Not on file   Social Drivers of Health   Financial Resource Strain: Not on file  Food Insecurity: Not on file  Transportation Needs: Not on file  Physical Activity: Not on file  Stress: Not on file  Social Connections: Not on file  Intimate Partner Violence: Not on file   Family History  Problem Relation Age of Onset   Kidney disease Sister    Heart disease Sister    Heart disease Brother    Heart failure Son    Heart disease Sister    ROS: All systems reviewed and negative except as per HPI.   Current Outpatient Medications  Medication Sig Dispense Refill   allopurinol  (ZYLOPRIM ) 100 MG tablet Take 150 mg by mouth every evening.      amLODipine  (NORVASC ) 5 MG tablet Take 1 tablet by mouth once daily 30 tablet 0   aspirin  EC 81 MG tablet Take 81 mg by mouth daily. Swallow whole.     calcitRIOL (ROCALTROL) 0.25 MCG capsule Take 0.25 mcg by mouth every Monday, Wednesday, and Friday.     carvedilol  (COREG ) 25 MG tablet Take 25 mg by mouth 2 (two) times daily with a meal.     clonazePAM  (KLONOPIN ) 0.5 MG tablet Take 0.5 mg by mouth every 8 (eight) hours as needed for anxiety.      FEROSUL 325 (65 Fe) MG tablet Take 325 mg by mouth every morning.     fluticasone  (FLONASE ) 50 MCG/ACT nasal spray Place 1 spray into both nostrils daily.     furosemide  (LASIX ) 20 MG tablet Take 20 mg by mouth daily as needed for fluid.     hydrALAZINE  (APRESOLINE ) 50 MG tablet TAKE 1 TABLET BY MOUTH THREE TIMES DAILY 270 tablet 1   Insulin  Glargine (LANTUS  [place]) Inject 30 Units into the skin daily.     Lancets (ONETOUCH DELICA PLUS LANCET33G) MISC USE 1 TO CHECK GLUCOSE TWICE DAILY     lansoprazole (PREVACID) 30 MG capsule Take 30 mg by mouth daily.     ONETOUCH ULTRA test strip      pravastatin  (PRAVACHOL ) 40 MG tablet Take 40 mg by mouth at bedtime.      valsartan  (DIOVAN ) 40 MG tablet TAKE 1 TABLET BY MOUTH ONCE  DAILY AT BEDTIME 90 tablet 0   No current facility-administered medications for this encounter.   Wt Readings from Last 3 Encounters:  02/14/24 76.3 kg (168 lb 3.2 oz)  08/09/23 75.8 kg (167 lb 3.2 oz)  03/28/23 76.7 kg (169 lb)   BP (!) 122/56   Pulse 64   Wt 76.3 kg (168 lb 3.2 oz)   SpO2 98%   BMI 27.15 kg/m   Physical Exam General:  NAD. No resp difficulty, walked into clinic HEENT: Normal Neck: Supple. No JVD. Cor: Regular rate & rhythm. No rubs, gallops or murmurs. Lungs: Clear Abdomen: Soft, nontender, nondistended.  Extremities: No cyanosis, clubbing, rash, edema Neuro: Alert & oriented x 3, moves all 4 extremities w/o difficulty.  Affect pleasant.  Assessment/Plan: 1. 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  Cardiolite in 4/23 with no ischemia or infarction. Most recent echo in 1/23 showed EF 45-50%, mild LV dilation, mild LVH, GLS -12.8% with apical sparing (?amyloidosis), normal RV, trivial pericardial effusion, moderate MR. PYP scan in 2/23 was equivocal, gene testing for hereditary TTR amyloidosis was negative.  Cardiac MRI in 5/23 showed  mild LV dilation with EF 37%, severe basal septal hypokinesis, no LVH, normal RV with RVEF 57%, diffuse mid wall LGE in the basal anteroseptum and basal inferoseptum, ECV 41% basal septum and 27% lateral wall. This was most suggestive of prior myocarditis or cardiac sarcoidosis, less likely cardiac amyloidosis. There was concern for possible sarcoidosis based on CT chest in 6/22 that showed "innumerable" pulmonary nodules.  Repeat HRCT in 7/23 was unchanged.  She saw pulmonary who thought pulmonary sarcoidosis was unlikely.  Cardiac PET 7/24 showed no evidence of active myocardial inflammation/sarcoidosis. GDMT limited by CKD stage IV. She does not look volume overloaded on exam, weight is stable. NYHA class II, limited mostly by fatigue.  - Continue valsartan  40 mg qhs (will not increase with  elevated creatinine). BMET/BNO today. - Continue hydralazine  50 mg tid - Continue Coreg  25 mg bid.  - Continue Lasix  20 mg PRN - She did not tolerate Farxiga  due to yeast infections.  - Avoid spironolactone  for now with history of hyperkalemia and CKD.  - Cardiac PET negative for cardiac sarcoidosis. Will arrange repeat PET to look for TTR cardiac amyloidosis (unlikely based on MRI pattern). 2. CKD stage IV: She had a tendency towards mild hyperkalemia. BMET today. 3. HTN: BP well-controlled - Continue current medications. 4. PVCs: 9% PVCs on 7/23 Zio monitor, probably not enough to explain cardiomyopathy.   - Denis palpitations.  Follow up in 3-4 months with Dr. Mitzie Anda.  Arlice Bene [facility] FNP-BC 02/14/2024

## 2024-02-13 NOTE — Telephone Encounter (Signed)
 Called to confirm/remind patient of their appointment at the [facility] on 02/14/24.   Appointment:   [] Confirmed  [x] Left mess   [] No answer/No voice mail  [] VM Full/unable to leave message  [] Phone not in service  And to bring in all medications and/or complete list.

## 2024-02-14 ENCOUNTER — Ambulatory Visit (HOSPITAL_COMMUNITY): Payer: Self-pay | Admitting: Family Medicine

## 2024-02-14 ENCOUNTER — Encounter (HOSPITAL_COMMUNITY): Payer: Self-pay

## 2024-02-14 ENCOUNTER — Ambulatory Visit (HOSPITAL_COMMUNITY)
Admission: RE | Admit: 2024-02-14 | Discharge: 2024-02-14 | Disposition: A | Discharge status: Home / Self Care | Source: Ambulatory Visit | Attending: Family Medicine | Admitting: Family Medicine

## 2024-02-14 VITALS — BP 122/56 | HR 64 | Wt 168.2 lb

## 2024-02-14 DIAGNOSIS — I429 Cardiomyopathy, unspecified: Secondary | ICD-10-CM | POA: Insufficient documentation

## 2024-02-14 DIAGNOSIS — Z794 Long term (current) use of insulin: Secondary | ICD-10-CM | POA: Insufficient documentation

## 2024-02-14 DIAGNOSIS — Z8249 Family history of ischemic heart disease and other diseases of the circulatory system: Secondary | ICD-10-CM | POA: Diagnosis not present

## 2024-02-14 DIAGNOSIS — E1122 Type 2 diabetes mellitus with diabetic chronic kidney disease: Secondary | ICD-10-CM | POA: Diagnosis not present

## 2024-02-14 DIAGNOSIS — I130 Hypertensive heart and chronic kidney disease with heart failure and stage 1 through stage 4 chronic kidney disease, or unspecified chronic kidney disease: Secondary | ICD-10-CM | POA: Insufficient documentation

## 2024-02-14 DIAGNOSIS — I5022 Chronic systolic (congestive) heart failure: Secondary | ICD-10-CM | POA: Diagnosis not present

## 2024-02-14 DIAGNOSIS — Z79899 Other long term (current) drug therapy: Secondary | ICD-10-CM | POA: Diagnosis not present

## 2024-02-14 DIAGNOSIS — I493 Ventricular premature depolarization: Secondary | ICD-10-CM | POA: Diagnosis not present

## 2024-02-14 DIAGNOSIS — N184 Chronic kidney disease, stage 4 (severe): Secondary | ICD-10-CM | POA: Diagnosis not present

## 2024-02-14 DIAGNOSIS — I10 Essential (primary) hypertension: Secondary | ICD-10-CM

## 2024-02-14 LAB — BRAIN NATRIURETIC PEPTIDE: B Natriuretic Peptide: 48.3 pg/mL (ref 0.0–100.0)

## 2024-02-14 LAB — BASIC METABOLIC PANEL WITH GFR
Anion gap: 9 (ref 5–15)
BUN: 50 mg/dL — ABNORMAL HIGH (ref 8–23)
CO2: 23 mmol/L (ref 22–32)
Calcium: 9.5 mg/dL (ref 8.9–10.3)
Chloride: 107 mmol/L (ref 98–111)
Creatinine, Ser: 2.15 mg/dL — ABNORMAL HIGH (ref 0.44–1.00)
GFR, Estimated: 23 mL/min — ABNORMAL LOW (ref >60)
Glucose, Bld: 108 mg/dL — ABNORMAL HIGH (ref 70–99)
Potassium: 4.3 mmol/L (ref 3.5–5.1)
Sodium: 139 mmol/L (ref 135–145)

## 2024-02-14 NOTE — Patient Instructions (Signed)
 Medication Changes:  No Changes In Medications at this time.   Lab Work:  Labs done today, your results will be available in MyChart, we will contact you for abnormal readings.  Testing/Procedures:  PYP SCAN- WE WILL CALL TO ARRANGE THIS ONCE APPROVED WITH INSURANCE   Follow-Up in: 3-4 MONTHS WITH DR. Mitzie Anda PLEASE CALL OUR OFFICE AROUND JULY TO GET SCHEDULED FOR YOUR APPOINTMENT. PHONE NUMBER IS [phone] OPTION 2    At the [facility], you and your health needs are our priority. We have a designated team specialized in the treatment of Heart Failure. This Care Team includes your primary Heart Failure Specialized Cardiologist (physician), Advanced Practice Providers (APPs- Physician Assistants and Nurse Practitioners), and Pharmacist who all work together to provide you with the care you need, when you need it.   You may see any of the following providers on your designated Care Team at your next follow up:  Dr. Jules Oar Dr. Peder Bourdon Dr. Alwin Baars Dr. Judyth Nunnery Nieves Bars, NP Ruddy Corral, [facility] [place], [place] Dennise Fitz, NP [place] Lee, NP Luster Salters, PharmD   Please be sure to bring in all your medications bottles to every appointment.   Need to Contact Us :  If you have any questions or concerns before your next appointment please send us  a message through [place] or call our office at [phone].    TO LEAVE A MESSAGE FOR THE NURSE SELECT OPTION 2, PLEASE LEAVE A MESSAGE INCLUDING: YOUR NAME DATE OF BIRTH CALL BACK NUMBER REASON FOR CALL**this is important as we prioritize the call backs  YOU WILL RECEIVE A CALL BACK THE SAME DAY AS LONG AS YOU CALL BEFORE 4:00 PM

## 2024-02-15 ENCOUNTER — Other Ambulatory Visit (HOSPITAL_COMMUNITY): Payer: Self-pay

## 2024-02-15 DIAGNOSIS — I5020 Unspecified systolic (congestive) heart failure: Secondary | ICD-10-CM

## 2024-02-15 DIAGNOSIS — I5022 Chronic systolic (congestive) heart failure: Secondary | ICD-10-CM

## 2024-02-15 MED ORDER — VALSARTAN 40 MG PO TABS: 40.0000 mg | ORAL_TABLET | Freq: Every day | ORAL | 3 refills | Status: AC

## 2024-02-16 ENCOUNTER — Ambulatory Visit: Payer: Medicare Other | Admitting: Cardiology

## 2024-02-18 ENCOUNTER — Other Ambulatory Visit (HOSPITAL_COMMUNITY): Payer: Self-pay | Admitting: Internal Medicine

## 2024-03-09 DIAGNOSIS — E119 Type 2 diabetes mellitus without complications: Secondary | ICD-10-CM | POA: Diagnosis not present

## 2024-03-09 DIAGNOSIS — M1990 Unspecified osteoarthritis, unspecified site: Secondary | ICD-10-CM | POA: Diagnosis not present

## 2024-03-09 DIAGNOSIS — M1A9XX1 Chronic gout, unspecified, with tophus (tophi): Secondary | ICD-10-CM | POA: Diagnosis not present

## 2024-03-09 DIAGNOSIS — Z79899 Other long term (current) drug therapy: Secondary | ICD-10-CM | POA: Diagnosis not present

## 2024-03-09 DIAGNOSIS — M109 Gout, unspecified: Secondary | ICD-10-CM | POA: Diagnosis not present

## 2024-04-04 ENCOUNTER — Ambulatory Visit: Attending: Cardiology | Admitting: Cardiology

## 2024-04-04 ENCOUNTER — Encounter: Payer: Self-pay | Admitting: Cardiology

## 2024-04-04 VITALS — BP 124/68 | HR 66 | Ht 66.5 in | Wt 167.2 lb

## 2024-04-04 DIAGNOSIS — I493 Ventricular premature depolarization: Secondary | ICD-10-CM | POA: Diagnosis not present

## 2024-04-04 DIAGNOSIS — N184 Chronic kidney disease, stage 4 (severe): Secondary | ICD-10-CM | POA: Diagnosis not present

## 2024-04-04 DIAGNOSIS — I5020 Unspecified systolic (congestive) heart failure: Secondary | ICD-10-CM

## 2024-04-04 DIAGNOSIS — I10 Essential (primary) hypertension: Secondary | ICD-10-CM | POA: Diagnosis not present

## 2024-04-04 NOTE — Progress Notes (Signed)
 Cardiology Office Note  Date: 04/04/2024   ID: Kayla Bell, DOB April 26, 1946, MRN [id number]  History of Present Illness: Kayla Bell is a 78 y.o. female last seen in November 2024.  Interval follow-up noted in the heart failure clinic in June, I reviewed the note.  She is here for a routine visit.  Reports NYHA class II dyspnea, no exertional chest pain, no palpitations or syncope.  She tries to walk for about 30 minutes each day, limited by right knee pain at times.  She tends to use her Lasix  2 or 3 days a week related to a feeling of abdominal fullness, typically does not have leg swelling, no orthopnea or PND.  We went over her medications.  No changes noted from a cardiac perspective.  Her blood pressure is well-controlled today.  I reviewed her lab work from June.  Last echocardiogram in December 2024 indicated LVEF approximately 40% with global hypokinesis and normal RV contraction.  She has moderate mitral regurgitation.  Physical Exam: VS:  BP 124/68 (BP Location: Left Arm)   Pulse 66   Ht 5' 6.5 (1.689 m)   Wt 167 lb 3.2 oz (75.8 kg)   SpO2 93%   BMI 26.58 kg/m , BMI Body mass index is 26.58 kg/m.  Wt Readings from Last 3 Encounters:  04/04/24 167 lb 3.2 oz (75.8 kg)  02/14/24 168 lb 3.2 oz (76.3 kg)  08/09/23 167 lb 3.2 oz (75.8 kg)    General: Patient appears comfortable at rest. HEENT: Conjunctiva and lids normal. Neck: Supple, no elevated JVP or carotid bruits. Lungs: Clear to auscultation, nonlabored breathing at rest. Cardiac: Regular rate and rhythm, no S3, 1/6 systolic murmur, no pericardial rub. Extremities: No pitting edema.  ECG:  An ECG dated 08/09/2023 was personally reviewed today and demonstrated:  Sinus rhythm with poor R wave progression.  Labwork: 02/14/2024: B Natriuretic Peptide 48.3; BUN 50; Creatinine, Ser 2.15; Potassium 4.3; Sodium 139, GFR 23  Other Studies Reviewed Today:  Echocardiogram 08/23/2023:  1. Left ventricular  ejection fraction, by estimation, is 40%. The left  ventricle has moderately decreased function. The left ventricle  demonstrates global hypokinesis. Left ventricular diastolic parameters are  indeterminate.   2. Right ventricular systolic function is normal. The right ventricular  size is normal. Tricuspid regurgitation signal is inadequate for assessing  PA pressure.   3. The mitral valve is abnormal. Moderate mitral valve regurgitation. No  evidence of mitral stenosis.   4. The aortic valve is tricuspid. There is mild calcification of the  aortic valve. There is mild thickening of the aortic valve. Aortic valve  regurgitation is not visualized. No aortic stenosis is present.   5. The inferior vena cava is normal in size with greater than 50%  respiratory variability, suggesting right atrial pressure of 3 mmHg.   Assessment and Plan:  1.  HFrEF, LVEF 34% by cardiac PET CT imaging in July 2024, approximately 40% with global hypokinesis by follow-up echocardiogram in December 2024.  Imaging has not suggested sarcoidosis or active myocardial inflammation.  Coronary calcification present in the LAD distribution.  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 GDMT limited by Farxiga  intolerance due to yeast infections and also inability to use MRA.  She is clinically stable with NYHA class II dyspnea and no progressive fluid retention, weight is stable.  Using Lasix  20 mg as needed.  Continue Diovan  40 mg daily, hydralazine  50 mg 3 times a day, and Coreg  25 mg twice daily.  2.  CKD stage IV.  Creatinine 2.15 with GFR 23 in June.  Continues to follow with nephrology.  She is in the process of switching practices due to insurance coverage.   3.  Primary hypertension.  Blood pressure well-controlled today.  Continue Norvasc  5 mg daily in addition to the above.  4.  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  Unlikely to be sole cause for her cardiomyopathy.  Disposition:  Follow up 6  months.  Signed, Jayson JUDITHANN Sierras, M.D., F.A.C.C. [facility]

## 2024-04-04 NOTE — Patient Instructions (Addendum)

## 2024-04-19 ENCOUNTER — Telehealth (HOSPITAL_COMMUNITY): Payer: Self-pay

## 2024-04-19 NOTE — Telephone Encounter (Signed)
-----   Message from Olam ORN sent at 03/27/2024  1:48 PM EDT ----- We will need Prior Auth for ordered Amyloid please.

## 2024-04-23 DIAGNOSIS — N2581 Secondary hyperparathyroidism of renal origin: Secondary | ICD-10-CM | POA: Diagnosis not present

## 2024-04-23 DIAGNOSIS — N184 Chronic kidney disease, stage 4 (severe): Secondary | ICD-10-CM | POA: Diagnosis not present

## 2024-04-23 DIAGNOSIS — E1122 Type 2 diabetes mellitus with diabetic chronic kidney disease: Secondary | ICD-10-CM | POA: Diagnosis not present

## 2024-04-23 DIAGNOSIS — D631 Anemia in chronic kidney disease: Secondary | ICD-10-CM | POA: Diagnosis not present

## 2024-04-23 DIAGNOSIS — I129 Hypertensive chronic kidney disease with stage 1 through stage 4 chronic kidney disease, or unspecified chronic kidney disease: Secondary | ICD-10-CM | POA: Diagnosis not present

## 2024-05-02 DIAGNOSIS — D509 Iron deficiency anemia, unspecified: Secondary | ICD-10-CM | POA: Diagnosis not present

## 2024-05-02 DIAGNOSIS — E11649 Type 2 diabetes mellitus with hypoglycemia without coma: Secondary | ICD-10-CM | POA: Diagnosis not present

## 2024-05-02 DIAGNOSIS — E7849 Other hyperlipidemia: Secondary | ICD-10-CM | POA: Diagnosis not present

## 2024-05-02 DIAGNOSIS — E875 Hyperkalemia: Secondary | ICD-10-CM | POA: Diagnosis not present

## 2024-05-02 DIAGNOSIS — R5383 Other fatigue: Secondary | ICD-10-CM | POA: Diagnosis not present

## 2024-05-09 DIAGNOSIS — I119 Hypertensive heart disease without heart failure: Secondary | ICD-10-CM | POA: Diagnosis not present

## 2024-05-09 DIAGNOSIS — E782 Mixed hyperlipidemia: Secondary | ICD-10-CM | POA: Diagnosis not present

## 2024-05-09 DIAGNOSIS — E1165 Type 2 diabetes mellitus with hyperglycemia: Secondary | ICD-10-CM | POA: Diagnosis not present

## 2024-05-09 DIAGNOSIS — I5022 Chronic systolic (congestive) heart failure: Secondary | ICD-10-CM | POA: Diagnosis not present

## 2024-05-18 ENCOUNTER — Encounter (HOSPITAL_COMMUNITY): Payer: Self-pay | Admitting: *Deleted

## 2024-05-25 ENCOUNTER — Telehealth (HOSPITAL_COMMUNITY): Payer: Self-pay | Admitting: Family Medicine

## 2024-05-25 NOTE — Telephone Encounter (Signed)
 Patient cancelled AMYLOID for reason below:  05/25/2024 9:07 AM By: HORNOWSKI KUBAK, ELZBIETA  Cancel Rsn: Patient (Pt wants cancel for now bc of her daughter's health issues.)   Order will be removed from the [facility].

## 2024-05-31 ENCOUNTER — Ambulatory Visit (HOSPITAL_COMMUNITY): Admission: RE | Admit: 2024-05-31 | Source: Ambulatory Visit | Attending: Family Medicine | Admitting: Family Medicine

## 2024-08-01 ENCOUNTER — Other Ambulatory Visit: Payer: Self-pay | Admitting: Cardiology
